# Patient Record
Sex: Male | Born: 1980 | Race: White | Hispanic: No | Marital: Single | Smoking: Never smoker
Health system: Southern US, Community
[De-identification: ages and names within clinical notes are randomized; demographics above are authoritative.]

## PROBLEM LIST (undated history)

## (undated) DIAGNOSIS — K219 Gastro-esophageal reflux disease without esophagitis: Secondary | ICD-10-CM

## (undated) DIAGNOSIS — D696 Thrombocytopenia, unspecified: Secondary | ICD-10-CM

## (undated) DIAGNOSIS — M8700 Idiopathic aseptic necrosis of unspecified bone: Secondary | ICD-10-CM

## (undated) DIAGNOSIS — R161 Splenomegaly, not elsewhere classified: Secondary | ICD-10-CM

## (undated) DIAGNOSIS — R188 Other ascites: Secondary | ICD-10-CM

## (undated) DIAGNOSIS — A045 Campylobacter enteritis: Secondary | ICD-10-CM

## (undated) DIAGNOSIS — J189 Pneumonia, unspecified organism: Secondary | ICD-10-CM

## (undated) DIAGNOSIS — K7460 Unspecified cirrhosis of liver: Secondary | ICD-10-CM

## (undated) HISTORY — DX: Idiopathic aseptic necrosis of unspecified bone: M87.00

## (undated) HISTORY — DX: Splenomegaly, not elsewhere classified: R16.1

## (undated) HISTORY — DX: Campylobacter enteritis: A04.5

## (undated) HISTORY — DX: Thrombocytopenia, unspecified: D69.6

## (undated) HISTORY — DX: Other ascites: R18.8

---

## 2011-09-29 DIAGNOSIS — R188 Other ascites: Secondary | ICD-10-CM

## 2011-09-29 DIAGNOSIS — D696 Thrombocytopenia, unspecified: Secondary | ICD-10-CM | POA: Diagnosis present

## 2011-09-29 DIAGNOSIS — R161 Splenomegaly, not elsewhere classified: Secondary | ICD-10-CM

## 2011-09-29 DIAGNOSIS — K7460 Unspecified cirrhosis of liver: Secondary | ICD-10-CM

## 2011-09-29 HISTORY — DX: Unspecified cirrhosis of liver: K74.60

## 2011-09-29 HISTORY — PX: PARACENTESIS: SHX844

## 2011-09-29 HISTORY — DX: Thrombocytopenia, unspecified: D69.6

## 2011-09-29 HISTORY — DX: Splenomegaly, not elsewhere classified: R16.1

## 2011-09-29 HISTORY — DX: Other ascites: R18.8

## 2015-08-29 HISTORY — PX: ESOPHAGOGASTRODUODENOSCOPY: SHX1529

## 2015-09-03 ENCOUNTER — Encounter: Payer: Self-pay | Admitting: Gastroenterology

## 2016-04-28 DIAGNOSIS — A045 Campylobacter enteritis: Secondary | ICD-10-CM

## 2016-04-28 HISTORY — DX: Campylobacter enteritis: A04.5

## 2016-05-08 ENCOUNTER — Inpatient Hospital Stay (HOSPITAL_COMMUNITY)
Admission: AD | Admit: 2016-05-08 | Discharge: 2016-05-10 | DRG: 372 | Disposition: A | Discharge status: Home / Self Care | Payer: Self-pay | Source: Other Acute Inpatient Hospital | Attending: Internal Medicine | Admitting: Internal Medicine

## 2016-05-08 DIAGNOSIS — K219 Gastro-esophageal reflux disease without esophagitis: Secondary | ICD-10-CM

## 2016-05-08 DIAGNOSIS — K766 Portal hypertension: Secondary | ICD-10-CM | POA: Diagnosis present

## 2016-05-08 DIAGNOSIS — K921 Melena: Secondary | ICD-10-CM

## 2016-05-08 DIAGNOSIS — R197 Diarrhea, unspecified: Secondary | ICD-10-CM | POA: Diagnosis present

## 2016-05-08 DIAGNOSIS — F1010 Alcohol abuse, uncomplicated: Secondary | ICD-10-CM | POA: Diagnosis present

## 2016-05-08 DIAGNOSIS — Z6836 Body mass index (BMI) 36.0-36.9, adult: Secondary | ICD-10-CM

## 2016-05-08 DIAGNOSIS — K7011 Alcoholic hepatitis with ascites: Secondary | ICD-10-CM | POA: Diagnosis present

## 2016-05-08 DIAGNOSIS — E538 Deficiency of other specified B group vitamins: Secondary | ICD-10-CM | POA: Diagnosis present

## 2016-05-08 DIAGNOSIS — K7031 Alcoholic cirrhosis of liver with ascites: Secondary | ICD-10-CM | POA: Diagnosis present

## 2016-05-08 DIAGNOSIS — A045 Campylobacter enteritis: Principal | ICD-10-CM | POA: Diagnosis present

## 2016-05-08 DIAGNOSIS — I10 Essential (primary) hypertension: Secondary | ICD-10-CM | POA: Diagnosis present

## 2016-05-08 DIAGNOSIS — Z8249 Family history of ischemic heart disease and other diseases of the circulatory system: Secondary | ICD-10-CM

## 2016-05-08 DIAGNOSIS — D500 Iron deficiency anemia secondary to blood loss (chronic): Secondary | ICD-10-CM | POA: Diagnosis present

## 2016-05-08 DIAGNOSIS — D6959 Other secondary thrombocytopenia: Secondary | ICD-10-CM | POA: Diagnosis present

## 2016-05-08 DIAGNOSIS — F1020 Alcohol dependence, uncomplicated: Secondary | ICD-10-CM | POA: Diagnosis present

## 2016-05-08 DIAGNOSIS — Z833 Family history of diabetes mellitus: Secondary | ICD-10-CM

## 2016-05-08 DIAGNOSIS — E860 Dehydration: Secondary | ICD-10-CM | POA: Diagnosis present

## 2016-05-08 HISTORY — DX: Unspecified cirrhosis of liver: K74.60

## 2016-05-09 ENCOUNTER — Encounter (HOSPITAL_COMMUNITY): Payer: Self-pay | Admitting: Internal Medicine

## 2016-05-09 DIAGNOSIS — K7031 Alcoholic cirrhosis of liver with ascites: Secondary | ICD-10-CM | POA: Diagnosis present

## 2016-05-09 DIAGNOSIS — A045 Campylobacter enteritis: Principal | ICD-10-CM | POA: Diagnosis present

## 2016-05-09 DIAGNOSIS — E860 Dehydration: Secondary | ICD-10-CM

## 2016-05-09 DIAGNOSIS — A09 Infectious gastroenteritis and colitis, unspecified: Secondary | ICD-10-CM

## 2016-05-09 DIAGNOSIS — F1010 Alcohol abuse, uncomplicated: Secondary | ICD-10-CM | POA: Diagnosis present

## 2016-05-09 DIAGNOSIS — R197 Diarrhea, unspecified: Secondary | ICD-10-CM | POA: Diagnosis present

## 2016-05-09 DIAGNOSIS — K921 Melena: Secondary | ICD-10-CM

## 2016-05-09 LAB — COMPREHENSIVE METABOLIC PANEL
ALT: 32 U/L (ref 17–63)
AST: 76 U/L — AB (ref 15–41)
Albumin: 2.6 g/dL — ABNORMAL LOW (ref 3.5–5.0)
Alkaline Phosphatase: 148 U/L — ABNORMAL HIGH (ref 38–126)
Anion gap: 11 (ref 5–15)
BUN: 14 mg/dL (ref 6–20)
CHLORIDE: 103 mmol/L (ref 101–111)
CO2: 24 mmol/L (ref 22–32)
CREATININE: 0.7 mg/dL (ref 0.61–1.24)
Calcium: 8.4 mg/dL — ABNORMAL LOW (ref 8.9–10.3)
GFR calc non Af Amer: >60 mL/min (ref >60)
Glucose, Bld: 89 mg/dL (ref 65–99)
POTASSIUM: 3.7 mmol/L (ref 3.5–5.1)
SODIUM: 138 mmol/L (ref 135–145)
Total Bilirubin: 2.4 mg/dL — ABNORMAL HIGH (ref 0.3–1.2)
Total Protein: 6.1 g/dL — ABNORMAL LOW (ref 6.5–8.1)

## 2016-05-09 LAB — TSH: TSH: 3.591 u[IU]/mL (ref 0.350–4.500)

## 2016-05-09 LAB — CBC
HEMATOCRIT: 31.5 % — AB (ref 39.0–52.0)
Hemoglobin: 10.2 g/dL — ABNORMAL LOW (ref 13.0–17.0)
MCH: 32.2 pg (ref 26.0–34.0)
MCHC: 32.4 g/dL (ref 30.0–36.0)
MCV: 99.4 fL (ref 78.0–100.0)
PLATELETS: 110 10*3/uL — AB (ref 150–400)
RBC: 3.17 MIL/uL — AB (ref 4.22–5.81)
RDW: 15.6 % — ABNORMAL HIGH (ref 11.5–15.5)
WBC: 9.8 10*3/uL (ref 4.0–10.5)

## 2016-05-09 LAB — GASTROINTESTINAL PANEL BY PCR, STOOL (REPLACES STOOL CULTURE)

## 2016-05-09 LAB — TYPE AND SCREEN
ABO/RH(D): A POS
Antibody Screen: NEGATIVE

## 2016-05-09 LAB — MAGNESIUM: Magnesium: 1.3 mg/dL — ABNORMAL LOW (ref 1.7–2.4)

## 2016-05-09 LAB — C DIFFICILE QUICK SCREEN W PCR REFLEX
C Diff antigen: NEGATIVE
C Diff interpretation: NOT DETECTED
C Diff toxin: NEGATIVE

## 2016-05-09 LAB — PROTIME-INR
INR: 1.39
Prothrombin Time: 17.2 seconds — ABNORMAL HIGH (ref 11.4–15.2)

## 2016-05-09 LAB — ABO/RH: ABO/RH(D): A POS

## 2016-05-09 LAB — PHOSPHORUS: Phosphorus: 3.9 mg/dL (ref 2.5–4.6)

## 2016-05-09 MED ORDER — LEVOFLOXACIN IN D5W 750 MG/150ML IV SOLN
750.0000 mg | INTRAVENOUS | Status: DC
Start: 2016-05-09 — End: 2016-05-10
  Administered 2016-05-09: 750 mg via INTRAVENOUS
  Filled 2016-05-09: qty 150

## 2016-05-09 MED ORDER — LORAZEPAM 2 MG/ML IJ SOLN: 0.0000 mg | Freq: Four times a day (QID) | INTRAMUSCULAR | Status: DC

## 2016-05-09 MED ORDER — PROPRANOLOL HCL 20 MG PO TABS
20.0000 mg | ORAL_TABLET | Freq: Two times a day (BID) | ORAL | Status: DC
  Administered 2016-05-09 – 2016-05-10 (×4): 20 mg via ORAL
  Filled 2016-05-09 (×5): qty 1

## 2016-05-09 MED ORDER — SODIUM CHLORIDE 0.9 % IV BOLUS (SEPSIS)
1000.0000 mL | Freq: Once | INTRAVENOUS | Status: AC
  Administered 2016-05-09: 1000 mL via INTRAVENOUS

## 2016-05-09 MED ORDER — LORAZEPAM 1 MG PO TABS: 1.0000 mg | ORAL_TABLET | Freq: Four times a day (QID) | ORAL | Status: DC | PRN

## 2016-05-09 MED ORDER — SODIUM CHLORIDE 0.9 % IV SOLN
INTRAVENOUS | Status: DC
  Administered 2016-05-09 – 2016-05-10 (×3): via INTRAVENOUS

## 2016-05-09 MED ORDER — ONDANSETRON HCL 4 MG/2ML IJ SOLN: 4.0000 mg | Freq: Four times a day (QID) | INTRAMUSCULAR | Status: DC | PRN

## 2016-05-09 MED ORDER — SODIUM CHLORIDE 0.9 % IV SOLN
INTRAVENOUS | Status: AC
Start: 2016-05-09 — End: 2016-05-09
  Administered 2016-05-09: 05:00:00 via INTRAVENOUS

## 2016-05-09 MED ORDER — SODIUM CHLORIDE 0.9% FLUSH
3.0000 mL | Freq: Two times a day (BID) | INTRAVENOUS | Status: DC
  Administered 2016-05-09: 3 mL via INTRAVENOUS

## 2016-05-09 MED ORDER — VITAMIN B-1 100 MG PO TABS
100.0000 mg | ORAL_TABLET | Freq: Every day | ORAL | Status: DC
  Administered 2016-05-09 – 2016-05-10 (×2): 100 mg via ORAL
  Filled 2016-05-09 (×2): qty 1

## 2016-05-09 MED ORDER — LORAZEPAM 2 MG/ML IJ SOLN: 1.0000 mg | Freq: Four times a day (QID) | INTRAMUSCULAR | Status: DC | PRN

## 2016-05-09 MED ORDER — FOLIC ACID 1 MG PO TABS
1.0000 mg | ORAL_TABLET | Freq: Every day | ORAL | Status: DC
  Administered 2016-05-09 – 2016-05-10 (×2): 1 mg via ORAL
  Filled 2016-05-09 (×2): qty 1

## 2016-05-09 MED ORDER — ADULT MULTIVITAMIN W/MINERALS CH
1.0000 | ORAL_TABLET | Freq: Every day | ORAL | Status: DC
  Administered 2016-05-09 – 2016-05-10 (×2): 1 via ORAL
  Filled 2016-05-09 (×2): qty 1

## 2016-05-09 MED ORDER — MAGNESIUM SULFATE 2 GM/50ML IV SOLN
2.0000 g | Freq: Once | INTRAVENOUS | Status: AC
  Administered 2016-05-09: 2 g via INTRAVENOUS
  Filled 2016-05-09: qty 50

## 2016-05-09 MED ORDER — PANTOPRAZOLE SODIUM 40 MG PO TBEC
40.0000 mg | DELAYED_RELEASE_TABLET | Freq: Every day | ORAL | Status: DC
  Administered 2016-05-09 – 2016-05-10 (×2): 40 mg via ORAL
  Filled 2016-05-09 (×2): qty 1

## 2016-05-09 MED ORDER — LORAZEPAM 2 MG/ML IJ SOLN: 0.0000 mg | Freq: Two times a day (BID) | INTRAMUSCULAR | Status: DC

## 2016-05-09 MED ORDER — ONDANSETRON HCL 4 MG PO TABS: 4.0000 mg | ORAL_TABLET | Freq: Four times a day (QID) | ORAL | Status: DC | PRN

## 2016-05-09 MED ORDER — THIAMINE HCL 100 MG/ML IJ SOLN: 100.0000 mg | Freq: Every day | INTRAMUSCULAR | Status: DC

## 2016-05-09 NOTE — H&P (Signed)
Grant [name] WGN:[id number] DOB: [birth year] DOA: 05/08/2016     PCP: none Outpatient Specialists: none Patient coming from:   home Lives  With family    Chief Complaint: Bloody diarrhea  HPI: Grant [name] is a 35 y.o. male with medical history significant of alcoholic cirrhosis with ascites and portal hypertension    Presented with sudden onset of diarrhea associated dark red stools and one episode of nausea and vomiting with minimal abdominal tenderness currently resolved. He reports eating pork ribs that he prepared himself his father also ate the same meal and have not developed any symptoms. His sister-in-law has recently had gastroenteritis from a trip to the beach but she has not been eating next to him or touched his meal. Has has history of GI bleed in the past state secondary to esophageal varices. He continues to drink about 6 packs every other day. Currently does not feel tremulous. Denies she will lightheadedness or history on a standup. Initially he have had up to 25 bloody bowel movements but now the number has decreased and he's been feeling more comfortable. Has not had any fevers or chills he has distended abdomen but no abdominal pain no chest pain no shortness of breath patient presented to [facility] for evaluation of bloody diarrhea.   Patient has known history of liver cirrhosis and have required thoracentesis in the past had been admitted in the past for GI bleed   IN ER: Afebrile heart rate up to 120 initially blood pressure 156/95 now down to 124/80 98% on RA  Sodium 137 potassium 4.6 anion gap 20 creatinine 0.4 glucose 117 total bilirubin 2.2 AST 109 ALT 43 alkaline phosphatase 233 albumin 3.6 and myoglobin 12.6 repeated 7 PM 0.0 UA no evidence of any UTI Stool was positive for Campylobacter Patient was given a dose of Levaquin 80 mg IV over Protonix and 1 L of normal saline INR 1.2 In emergency department patient continued to have bloody frequent stools  and one episode of vomiting bright red emesis Case has been discussed with Dr. Leone Payor at [facility] will consult on a patient  CT of abdomen and pelvis showed hepatomegaly with cirrhosis import hypertension splenomegaly perihepatic ascites. Umbilical fluid density structure Hospitalist was called for admission for bloody diarrhea secondary to Campylobacter  Review of Systems:    Pertinent positives include:  indigestion, abdominal pain, nausea, vomiting, diarrhea, blood in stool, hematemesis  Constitutional:  No weight loss, night sweats, Fevers, chills, fatigue, weight loss  HEENT:  No headaches, Difficulty swallowing,Tooth/dental problems,Sore throat,  No sneezing, itching, ear ache, nasal congestion, post nasal drip,  Cardio-vascular:  No chest pain, Orthopnea, PND, anasarca, dizziness, palpitations.no Bilateral lower extremity swelling  GI:  No heartburn,  change in bowel habits, loss of appetite, melena, Resp:  no shortness of breath at rest. No dyspnea on exertion, No excess mucus, no productive cough, No non-productive cough, No coughing up of blood.No change in color of mucus.No wheezing. Skin:  no rash or lesions. No jaundice GU:  no dysuria, change in color of urine, no urgency or frequency. No straining to urinate.  No flank pain.  Musculoskeletal:  No joint pain or no joint swelling. No decreased range of motion. No back pain.  Psych:  No change in mood or affect. No depression or anxiety. No memory loss.  Neuro: no localizing neurological complaints, no tingling, no weakness, no double vision, no gait abnormality, no slurred speech, no confusion  As per HPI otherwise 10 point review  of systems negative.   Past Medical History: Past Medical History:  Diagnosis Date  . Cirrhosis (HCC)    History reviewed. No pertinent surgical history.   Social History:  Ambulatory  independently      reports that he has never smoked. He has never used smokeless tobacco. He  reports that he drinks about 12.0 oz of alcohol per week . He reports that he does not use drugs.  Allergies:  No Known Allergies     Family History:  Family History  Problem Relation Age of Onset  . Diabetes Father   . Hypertension Father   . Diabetes Other   . Cancer Neg Hx     Current Meds  Medication Sig  . benazepril (LOTENSIN) 20 MG tablet Take 20 mg by mouth daily.  Marland Kitchen omeprazole (PRILOSEC) 20 MG capsule Take 20 mg by mouth 2 (two) times daily before a meal.  . propranolol (INDERAL) 40 MG tablet Take 40 mg by mouth 2 (two) times daily.     Physical Exam: Patient Vitals for the past 24 hrs:  BP Temp Temp src Pulse Resp SpO2 Height Weight  05/08/16 2326 (!) 144/73 98.3 F (36.8 C) Oral (!) 120 18 97 % 5\' 10"  (1.778 m) 116.4 kg (256 lb 9.9 oz)    1. General:  in No Acute distress 2. Psychological: Alert and   Oriented 3. Head/ENT:    Dry Mucous Membranes                          Head Non traumatic, neck supple                          Normal   Dentition           4. SKIN:  decreased Skin turgor,  Skin clean Dry and intact no rash icteric 5. Heart: Regular rate and rhythm no  Murmur, Rub or gallop 6. Lungs:  Clear to auscultation bilaterally, no wheezes or crackles   7. Abdomen: Soft, non-tender,  distended 8. Lower extremities: no clubbing, cyanosis, or edema 9. Neurologically Grossly intact, moving all 4 extremities equally 10. MSK: Normal range of motion   body mass index is 36.82 kg/m.  Labs on Admission:   Labs on Admission: I have personally reviewed following labs and imaging studies  CBC: No results for input(s): WBC, NEUTROABS, HGB, HCT, MCV, PLT in the last 168 hours. Basic Metabolic Panel: No results for input(s): NA, K, CL, CO2, GLUCOSE, BUN, CREATININE, CALCIUM, MG, PHOS in the last 168 hours. GFR: CrCl cannot be calculated (No order found.). Liver Function Tests: No results for input(s): AST, ALT, ALKPHOS, BILITOT, PROT, ALBUMIN in the last  168 hours. No results for input(s): LIPASE, AMYLASE in the last 168 hours. No results for input(s): AMMONIA in the last 168 hours. Coagulation Profile: No results for input(s): INR, PROTIME in the last 168 hours. Cardiac Enzymes: No results for input(s): CKTOTAL, CKMB, CKMBINDEX, TROPONINI in the last 168 hours. BNP (last 3 results) No results for input(s): PROBNP in the last 8760 hours. HbA1C: No results for input(s): HGBA1C in the last 72 hours. CBG: No results for input(s): GLUCAP in the last 168 hours. Lipid Profile: No results for input(s): CHOL, HDL, LDLCALC, TRIG, CHOLHDL, LDLDIRECT in the last 72 hours. Thyroid Function Tests: No results for input(s): TSH, T4TOTAL, FREET4, T3FREE, THYROIDAB in the last 72 hours. Anemia Panel: No results for input(s):  VITAMINB12, FOLATE, FERRITIN, TIBC, IRON, RETICCTPCT in the last 72 hours. Urine analysis: No results found for: COLORURINE, APPEARANCEUR, LABSPEC, PHURINE, GLUCOSEU, HGBUR, BILIRUBINUR, KETONESUR, PROTEINUR, UROBILINOGEN, NITRITE, LEUKOCYTESUR Sepsis Labs: @LABRCNTIP (procalcitonin:4,lacticidven:4) )No results found for this or any previous visit (from the past 240 hour(s)).     UA   no evidence of UTI  No results found for: HGBA1C  CrCl cannot be calculated (No order found.).  BNP (last 3 results) No results for input(s): PROBNP in the last 8760 hours.   ECG REPORT  Independently reviewed Rate: 123  Rhythm: Sinus tachycardia ST&T Change: No acute ischemic changes  QTC 480  Filed Weights   05/08/16 2326  Weight: 116.4 kg (256 lb 9.9 oz)     Cultures: No results found for: SDES, SPECREQUEST, CULT, REPTSTATUS   Radiological Exams on Admission: No results found.  Chart has been reviewed    Assessment/Plan   35 y.o. male with medical history significant of alcoholic cirrhosis with ascites and portal hypertensionPresents with Campylobacter-induced hemorrhagic  enteritis  with evidence of  dehydration  Present on Admission: . Dehydration - IV fluids check orthostatics tomorrow . Alcoholic cirrhosis (HCC) - chronic strongly recommended discontinuation of alcohol abuse we will need to have GI follow-up as an outpatient, follow INR currently no evidence of coagulopathy, patient does has history of portal hypertension . Diarrhea secondary to infectious etiology treat appropriately . Enteritis due to Campylobacter species - as per review of up-to-date Will initiate Levaquin per pharmacy, apreciate GI consult in AM, follow CBC . Alcohol abuse - CIWA protocol   Other plan as per orders.  DVT prophylaxis:  SCD      Code Status:  FULL CODE as per patient    Family Communication:   Family not at  Bedside   Disposition Plan:   To home once workup is complete and patient is stable   Consults called: Leone PayorGessner aware   Admission status:    inpatient        Level of care     tele           I have spent a total of  56 min on this admission    Tresea Heine 05/09/2016, 2:20 AM    [facility]  Pager [phone]   after 2 AM please page floor coverage PA If 7AM-7PM, please contact the day team taking care of the patient  [email or web address]  Password [email or web address]

## 2016-05-09 NOTE — Progress Notes (Signed)
Patient seen and examined. Admitted after midnight secondary to bloody diarrhea and vomiting. Patient had a history of alcohol hepatitis, cirrhosis and ongoing alcoholism. Patient was transferred [facility] secondary to lack of GI coverage over the weekend. Patient is currently hemodynamically stable, no complaining of any chest pain and reports no further episodes of hematemesis. Stools [facility] demonstrated positive Campylobacter jejuni infection. cultures at more her demonstrated positive He also endorses improvement/decrease in the amount of loose bloody stools. Please refer to H&P written by Dr. Adela Glimpseoutova for further info/details on admission.  Plan: -Will continue supportive care, IV PPI, IV Levaquin and follow GI recommendations -Diet to be advanced as per GI recommendations -continue CIWA protocol -continue thiamine and folic acid -cessation counseling for alcohol provided.   Grant LollMadera, Grant [name] [phone]

## 2016-05-09 NOTE — Progress Notes (Signed)
Pharmacy Antibiotic Note  Grant BosBryan [name] is a 35 y.o. male admitted [facility]2016 with bloody diarrhea - found to have camphylobacter enteritis and [facility].  Pharmacy has been consulted for Levaquin dosing.  Levaquin 750mg  IV given at [facility] ~1745  SCr (at [facility]) 0.4, est Crcl > 100 ml/min  Plan: Levaquin 750mg  IV q24h. Next dose 8/12 1800. Will f/u renal fxn, micro data, and pt's clinical condition  Height: 5\' 10"  (177.8 cm) Weight: 256 lb 9.9 oz (116.4 kg) IBW/kg (Calculated) : 73  Temp (24hrs), Avg:98.3 F (36.8 C), Min:98.3 F (36.8 C), Max:98.3 F (36.8 C)  No results for input(s): WBC, CREATININE, LATICACIDVEN, VANCOTROUGH, VANCOPEAK, VANCORANDOM, GENTTROUGH, GENTPEAK, GENTRANDOM, TOBRATROUGH, TOBRAPEAK, TOBRARND, AMIKACINPEAK, AMIKACINTROU, AMIKACIN in the last 168 hours.  CrCl cannot be calculated (No order found.).    No Known Allergies  Antimicrobials this admission: 8/11 Levaquin >>   Microbiology results: 8/11 stool ([facility]): camphylobacter jejuni 8/12 Cdiff: 8/12 GI PCR:  Thank you for allowing pharmacy to be a part of this patient's care.  Christoper Fabianaron Caitlin Ainley, PharmD, BCPS Clinical pharmacist, pager [phone] 05/09/2016 2:23 AM

## 2016-05-09 NOTE — Progress Notes (Signed)
[name]: 9:20 AM 05/09/2016  LOS: 0 days    Referring Provider: Dr Gwenlyn PerkingMadera  Primary Care Physician:  No PCP Per Patient previously followed [facility] [place].  Primary Gastroenterologist[place]  [place] [facility]     Reason for Consultation:  Bloody diarrhea and vomiting.    HPI: Grant [name] is a 35 y.o. male.  Hx ETOH hepatitis.  ETOH (also ? Of obesity related fatty liver based on his exam) cirrhosis, diagnosis dates to at least 2013.  Ongoing alcoholism.  08/2015 GIB (limited hematemesis) due to presumed esophageal varices, underwent EGD and "cauterized a vein or 2".  Taking Propanolol since but no GI follow up and no regular primary care follow up.  Ascites (dating to at least 2013) and prior paracentesis 2013.  Htn.  Thrombocytopenia.  Splenomegaly.  Folate deficiency.  Has only had the one EGD 08/2015 in his lifetime.   Admits to drinking 6 [employer] ales per day  3AM on 8/11 developed watery, frequent diarrhea and low abd pain.  Vomited small amount of blood at first, then no vomiting until once again, some blood as well, in ED that afternoon.  At noon diarrhea turned bloody.  At peak having BMs every 20 to 30 minutes, though pain resolved once bleeding started.  No syncope.    [facility]: hepatomegaly, cirrhosis, splenomegaly, perihepatic ascites, portal htn, umbilical fluid density, conglomeration of vessels and edema in right lower mesentery, bil femoral head AVN and ? Right subchondral fracture, right mid-lobe lung nodule: non-specific.   Campylobacter + on stool study, started on Levaquin and stools quickly improved with lower volume and frequency, also now darker/less bright red.  No recurrent emesis.   Hgb in [place] at  1900 was 12.0.  It is 10.2 at 0630 today.    No regular job.  No health care insurance.  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   A sister in law has recently had Salmonella food poisoning, he has seen her since her sxs resolved.  Otherwise no sick contacts.  Wt up 30 to 40# in last few years.  For several weeks, some increase in abd girth.   Eats well.  Pt admits to having a drinking problem but does not consider himself and alcoholic.  Had some sobriety but relapsed several months ago.  Has never been to ETOH rehab or to an AA mtg.       Past Medical History:  Diagnosis Date  . Cirrhosis (HCC)     History reviewed. No pertinent surgical history.  Prior to Admission medications   Medication Sig Start Date End Date Taking? Authorizing Provider  benazepril (LOTENSIN) 20 MG tablet Take 20 mg by mouth daily.   Yes Historical Provider, MD  omeprazole (PRILOSEC) 20 MG capsule Take 20 mg by mouth 2 (two) times daily before a meal.   Yes Historical Provider, MD  propranolol (INDERAL) 40 MG tablet Take 40 mg by mouth 2 (two) times  daily.   Yes Historical Provider, MD    Scheduled Meds: . folic acid  1 mg Oral Daily  . levofloxacin (LEVAQUIN) IV  750 mg Intravenous Q24H  . LORazepam  0-4 mg Intravenous Q6H   Followed by  . [START ON 05/11/2016] LORazepam  0-4 mg Intravenous Q12H  . multivitamin with minerals  1 tablet Oral Daily  . pantoprazole  40 mg Oral Daily  . propranolol  20 mg Oral BID  . sodium chloride flush  3 mL Intravenous Q12H  . thiamine  100 mg Oral Daily   Or  . thiamine  100 mg Intravenous Daily   Infusions: . sodium chloride 125 mL/hr at 05/09/16 0441   PRN Meds: LORazepam **OR** LORazepam, ondansetron **OR** ondansetron (ZOFRAN) IV   Allergies as of 05/08/2016  . (Not on File)    Family History  Problem Relation Age of Onset  . Diabetes Father   . Hypertension Father   . Diabetes Other   . Cancer Neg Hx      Social History   Social History  . Marital status: Single    Spouse name: N/A  . Number of children: N/A  . Years of education: 2 to 3 yrs of colleg   Occupational History  . 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 Smoking status: Never Smoker  . Smokeless tobacco: Never Used  . Alcohol use 12.0 oz/week    20 Cans of beer per week     Comment: in 04/2016 admits to 6, 12 oz apple ales per day.   . Drug use: No  . Sexual activity: Not on file   Other Topics Concern  . Not on file   Social History Narrative   Had nearly 1 year of college credit gained in high school AP classes.  Stayed in college for about 2 years, has about 3 yrs of college credits.     REVIEW OF SYSTEMS: Constitutional:  Generally no issues with weakness or fatigue ENT:  No nose bleeds Pulm:  No SOB or cough CV:  No palpitations, no LE edema.  GU:  No hematuria, no frequency GI:  Per HPI.  No dysphagia.  No constipation or previous BPR. Heme:  No unusual bleeding or bruuising   Transfusions:  none Neuro:  No headaches, no peripheral tingling or numbness Derm:  No itching, no rash or sores.  Endocrine:  No sweats or chills.  No polyuria or dysuria Immunization:  Not queried.  Travel:  None beyond local counties in last few months.    PHYSICAL EXAM: Vital signs in last 24 hours: Vitals:   05/09/16 0458 05/09/16 0527  BP:  125/65  Pulse: (!) 107 92  Resp:  20  Temp:  98.2 F (36.8 C)   Wt Readings from Last 3 Encounters:  05/08/16 116.4 kg (256 lb 9.9 oz)    General: obese, non-ill looking though pale, WM.  comfortable Head:  No asymmetry or swelling  Eyes:  No icterus or pallor Ears:  Not HOH  Nose:  No congestion or discharge Mouth:  Clear and moist Neck:  No JVD or masses Lungs:  Clear bil.   No cough. Heart: RRR.  No mrg Abdomen:  Obese with large striae.  No obvious hernias.  + hepatomegaly, but no tenderness.   Unable to locate/palpate  spleen.   Rectal: deferred   Musc/Skeltl: no joint swelling, redness or deformity Extremities:  No  CCE  Neurologic:  Oriented x 3.  Calm, engaged.  No tremor.  No asterixis.   Skin:  No telangectasia or rash   Psych:  Cooperative and pleasant.   Intake/Output from previous day: 08/11 0701 - 08/12 0700 In: 1164.6 [I.V.:164.6; IV Piggyback:1000] Out: 225 [Urine:225]  LAB RESULTS:  Recent Labs  05/09/16 0624  WBC 9.8  HGB 10.2*  HCT 31.5*  PLT 110*   BMET Lab Results  Component Value Date   NA 138 05/09/2016   K 3.7 05/09/2016   CL 103 05/09/2016   CO2 24 05/09/2016   GLUCOSE 89 05/09/2016   BUN 14 05/09/2016   CREATININE 0.70 05/09/2016   CALCIUM 8.4 (L) 05/09/2016   LFT  Recent Labs  05/09/16 0624  PROT 6.1*  ALBUMIN 2.6*  AST 76*  ALT 32  ALKPHOS 148*  BILITOT 2.4*   PT/INR Lab Results  Component Value Date   INR 1.39 05/09/2016     ENDOSCOPIC STUDIES: Per HPI  IMPRESSION:   *  Bloody diarrhea.  + campylobacter Ag.  sxs improving after starting Levaquin on 8/11 (1 dose thus far).  Wonder if cattle are source of the infection.   *  Cirrhosis due to ETOH and possibly obesity related fatty liver.  Hepatitis serologies not in Care Everywhere.  Suspect superimposed mild ETOH hepatitis (disc fx score of ~12).    *  Limited bloody emesis.  EGD with therapeutic intervention to what sounds like esophageal varices in 08/2016.  On Propanolol and Omeprazole at home  *  Normocytic anemia due to blood loss and dilutional effect of IVF.    *  Thrombocytopenia.  Non-critical.  Due to cirrhosis.   *  Elevated PT, INR wnl.    *  Alcoholism, ongoing abuse.  On CIWA protocol.     PLAN:     *  Follow CBC in AM.  Get Hep C and B serologies, as I do not see these in Care Everywhere.  *  Continue daily po Protonix.   *  EGD?, not urgent.   *  Emphasized need to stop all ETOH and suggested attendance at [facility] *  Needs to establish with a PMD and GI MD.      Jennye Moccasin  05/09/2016, 9:20 AM Pager: [phone]     [facility] Attending   I have taken an interval history, reviewed the chart and examined the patient. I agree with the Advanced Practitioner's note, impression and recommendations.   I do not think he is having bleeding from portal htn/varices - not sure he had any sig hematemesis Overall picture is of campylobacter diarrhea with bleeding from intestine in setting of alcoholic liver disease and cirrhosis Will feed If as improved as he says he is home tomorrow/Mon If other problems seen will determine if endoscopy needed Needs reg care as outpt from a PCP and GI No ETOH  Iva Boop, MD, [facility] [phone] (pager) [phone] after 5 PM, weekends and holidays  05/09/2016 1:22 PM

## 2016-05-10 DIAGNOSIS — K219 Gastro-esophageal reflux disease without esophagitis: Secondary | ICD-10-CM

## 2016-05-10 LAB — CBC
HCT: 30.5 % — ABNORMAL LOW (ref 39.0–52.0)
Hemoglobin: 9.6 g/dL — ABNORMAL LOW (ref 13.0–17.0)
MCH: 32 pg (ref 26.0–34.0)
MCHC: 31.5 g/dL (ref 30.0–36.0)
MCV: 101.7 fL — AB (ref 78.0–100.0)
PLATELETS: 106 10*3/uL — AB (ref 150–400)
RBC: 3 MIL/uL — ABNORMAL LOW (ref 4.22–5.81)
RDW: 16.1 % — AB (ref 11.5–15.5)
WBC: 8 10*3/uL (ref 4.0–10.5)

## 2016-05-10 MED ORDER — FOLIC ACID 1 MG PO TABS: 1.0000 mg | ORAL_TABLET | Freq: Every day | ORAL | 1 refills | Status: AC

## 2016-05-10 MED ORDER — PROPRANOLOL HCL 20 MG PO TABS: 20.0000 mg | ORAL_TABLET | Freq: Two times a day (BID) | ORAL | 1 refills | Status: DC

## 2016-05-10 MED ORDER — THIAMINE HCL 100 MG PO TABS: 100.0000 mg | ORAL_TABLET | Freq: Every day | ORAL | 1 refills | Status: AC

## 2016-05-10 MED ORDER — LEVOFLOXACIN 750 MG PO TABS: 750.0000 mg | ORAL_TABLET | Freq: Every day | ORAL | 0 refills | Status: DC

## 2016-05-10 NOTE — Progress Notes (Signed)
Reviewed discharge instructions and medications with patient. Answered all questions.

## 2016-05-10 NOTE — Progress Notes (Signed)
          Daily Rounding Note  05/10/2016, 11:46 AM  LOS: 1 day   SUBJECTIVE:   Chief complaint:  Bloody diarrhea   Stools no longer bloody, now just loose, watery and less frequent.  Tolerating full liquids.  Fells well.  No further n/v.    OBJECTIVE:         Vital signs in last 24 hours:    Temp:  [97.8 F (36.6 C)-98.6 F (37 C)] 97.8 F (36.6 C) (08/13 0904) Pulse Rate:  [86-95] 95 (08/13 0904) Resp:  [16-18] 16 (08/13 0904) BP: (109-125)/(62-84) 125/74 (08/13 0904) SpO2:  [96 %-100 %] 97 % (08/13 0904) Last BM Date: 05/09/16 Filed Weights   05/08/16 2326  Weight: 116.4 kg (256 lb 9.9 oz)   General: pleasant, somewhat pale but looks well   Heart: RRR.  No mrg Chest: clear, no cough or dyspnea Abdomen: obese, NT, ND.  Active BS  Extremities: no CCE Neuro/Psych:  Alert, fully oriented x 3.  No tremor. No gross weakness.   Intake/Output from previous day: 08/12 0701 - 08/13 0700 In: 4690 [P.O.:2280; I.V.:2410] Out: 850 [Urine:850]   Lab Results:  Recent Labs  05/09/16 0624 05/10/16 0449  WBC 9.8 8.0  HGB 10.2* 9.6*  HCT 31.5* 30.5*  PLT 110* 106*     ASSESMENT:   *  Bloody diarrhea.  + campylobacter Ag.  sxs improving after starting Levaquin on 8/11.  Bleeding has resolved.  Wonder if cattle are source of the infection.   *  Cirrhosis due to ETOH and possibly obesity related fatty liver. Suspect superimposed mild ETOH hepatitis (disc fx score of ~12).  Hepatitis serologies not in Care Everywhere and currently in process.     *  Limited bloody emesis, no recurrence. Marland Kitchen.  EGD with therapeutic intervention to what sounds like esophageal varices in 08/2016.  On Propanolol and Omeprazole at home  *  Normocytic anemia due to blood loss and dilutional effect of IVF.    *  Thrombocytopenia.  Non-critical.  Due to cirrhosis.   *  Elevated PT, INR wnl.    *  Alcoholism, ongoing abuse.  On CIWA protocol.  No  active withdrawal at present.      PLAN   *  OK to discharge today.  [facility].  Needs to stay sober, attend AA meetings.  Needs to establish or restablish care with a PMD.  Has seen Dr Teena DunkBenson, [place], in [place] who performed pt's EGD last year.  Should restablish care with him. Hand hygeine to avoid infections.  Stay on daily PPI.  Continue Propanolol.     Jennye MoccasinSarah Gribbin  05/10/2016, 11:46 AM Pager: [phone]    [facility] Attending   I have taken an interval history, reviewed the chart and examined the patient. I agree with the Advanced Practitioner's note, impression and recommendations.    He is improved and can go home as above.  Iva Booparl E. Shuree Brossart, MD, [facility] [phone] (pager) [phone] after 5 PM, weekends and holidays  05/10/2016 12:12 PM

## 2016-05-10 NOTE — Discharge Summary (Signed)
Physician Discharge Summary  Darnelle BosBryan Veach ZOX:[id number] DOB: 08/17/1981 DOA: 05/08/2016  PCP: No PCP Per Patient  Admit date: 05/08/2016 Discharge date: 05/10/2016  Time spent: 35 minutes  Recommendations for Outpatient Follow-up:  1. Repeat BMET to follow electrolytes and renal function  2. Repeat magnesium level to follow up trend 3. Repeat CBC to follow Hgb and platelets   Discharge Diagnoses:  Active Problems:   Dehydration   Alcoholic cirrhosis of liver with ascites (HCC)   Diarrhea   Campylobacter diarrhea   Alcohol abuse   Hematochezia   Esophageal reflux   Morbid obesity due to excess calories [facility](HCC)   Discharge Condition: stable and improved. No further hematochezia and/or hematemesis. Patient advise to establish care with PCP and GI service for follow up as an outpatient.  Diet recommendation: low sodium diet (giving cirrhosis hx)  Filed Weights   05/08/16 2326  Weight: 116.4 kg (256 lb 9.9 oz)    History of present illness:  Patient had a history of alcohol hepatitis, cirrhosis and ongoing alcoholism. Patient was transferred from [facility] secondary to lack of GI coverage over the weekend. Presented there with 2 days of bloody diarrhea and episode of hematemesis. No CP, no SOB.  Hospital Course:  1-bloody diarrhea: secondary to campylobacter infection  -improved and no longer with hematochezia -will discharge on levaquin to complete therapy (5 days of antibiotics total) -advise to keep himself well hydrated  -outpatient follow up with PCP and GI recommended   2-alcohol hepatitis/cirrhosis  -outpatient follow up with GI recommended -advise to quit alcohol intake -continue inderal and PPI  3-GERD: -continue PPI  4-alcohol abuse: -no withdrawal symptoms appreciated -patient was on CIWA protocol  -cessation counseling provided -discharge on folic acid and thiamine   5-thrombocytopenia : -secondary to alcohol consumption mos likely  -no bruises  appreciated on physical exam -no further bleeding once GIB stopped -will need CBC to follow up with platelets trend  Procedures:  See below for x-ray reports   Consultations: Gastroenterology (Dr. Leone PayorGessner)   Discharge Exam: Vitals:   05/10/16 0904 05/10/16 1214  BP: 125/74 138/70  Pulse: 95 90  Resp: 16   Temp: 97.8 F (36.6 C)     General: afebrile, no nausea, no vomiting/hematemesis and no further episodes of hematochezia. Patient denies abd pain and is tolerating diet. Cardiovascular: S1 and S2, no rubs, no gallops Respiratory: CTA bilaterally Abd: obese, soft, NT, ND, positive BS Extremities: no cyanosis and no edema   Discharge Instructions   Discharge Instructions    Discharge instructions    Complete by:  As directed   Take medications as prescribed Stop drinking alcohol Arrange follow up with PCP in 10 days Follow up in as needed basis with Gastroenterologist (but set up appointment for further follow up)     Current Discharge Medication List    START taking these medications   Details  folic acid (FOLVITE) 1 MG tablet Take 1 tablet (1 mg total) by mouth daily. Qty: 30 tablet, Refills: 1    levofloxacin (LEVAQUIN) 750 MG tablet Take 1 tablet (750 mg total) by mouth daily. Qty: 4 tablet, Refills: 0    thiamine 100 MG tablet Take 1 tablet (100 mg total) by mouth daily. Qty: 30 tablet, Refills: 1      CONTINUE these medications which have CHANGED   Details  propranolol (INDERAL) 20 MG tablet Take 1 tablet (20 mg total) by mouth 2 (two) times daily. Qty: 60 tablet, Refills: 1  CONTINUE these medications which have NOT CHANGED   Details  Multiple Vitamin (MULTIVITAMIN WITH MINERALS) TABS tablet Take 1 tablet by mouth daily.    omeprazole (PRILOSEC) 20 MG capsule Take 20 mg by mouth 2 (two) times daily before a meal.    OVER THE COUNTER MEDICATION Place 1 drop into both eyes daily as needed (dry eyes).      STOP taking these medications      benazepril (LOTENSIN) 20 MG tablet        No Known Allergies Follow-up Information    Stan Head, MD .   Specialty:  Gastroenterology Why:  contact office and set up appointment for visit on as needed basis if you will like to establish care with him Contact information: [address] [place] [address] [phone]        Eula Listen, MD .   Specialty:  Gastroenterology Why:  contact office for follow up visit if you will like to establish care with this gastroenterologist in [facility] information: [address] [place] [address] [phone]           The results of significant diagnostics from this hospitalization (including imaging, microbiology, ancillary and laboratory) are listed below for reference.    Significant Diagnostic Studies: No results found.  Microbiology: Recent Results (from the past 240 hour(s))  Gastrointestinal Panel by PCR , Stool     Status: None   Collection Time: 05/09/16  1:39 AM  Result Value Ref Range Status   Campylobacter species NOT DETECTED NOT DETECTED Final   Plesimonas shigelloides NOT DETECTED NOT DETECTED Final   Salmonella species NOT DETECTED NOT DETECTED Final   Yersinia enterocolitica NOT DETECTED NOT DETECTED Final   Vibrio species NOT DETECTED NOT DETECTED Final   Vibrio cholerae NOT DETECTED NOT DETECTED Final   Enteroaggregative E coli (EAEC) NOT DETECTED NOT DETECTED Final   Enteropathogenic E coli (EPEC) NOT DETECTED NOT DETECTED Final   Enterotoxigenic E coli (ETEC) NOT DETECTED NOT DETECTED Final   Shiga like toxin producing E coli (STEC) NOT DETECTED NOT DETECTED Final   E. coli O157 NOT DETECTED NOT DETECTED Final   Shigella/Enteroinvasive E coli (EIEC) NOT DETECTED NOT DETECTED Final   Cryptosporidium NOT DETECTED NOT DETECTED Final   Cyclospora cayetanensis NOT DETECTED NOT DETECTED Final   Entamoeba histolytica NOT DETECTED NOT DETECTED Final   Giardia lamblia NOT DETECTED NOT  DETECTED Final   Adenovirus F40/41 NOT DETECTED NOT DETECTED Final   Astrovirus NOT DETECTED NOT DETECTED Final   Norovirus GI/GII NOT DETECTED NOT DETECTED Final   Rotavirus A NOT DETECTED NOT DETECTED Final   Sapovirus (I, II, IV, and V) NOT DETECTED NOT DETECTED Final  C difficile quick scan w PCR reflex     Status: None   Collection Time: 05/09/16  1:39 AM  Result Value Ref Range Status   C Diff antigen NEGATIVE NEGATIVE Final   C Diff toxin NEGATIVE NEGATIVE Final   C Diff interpretation No C. difficile detected.  Final     Labs: Basic Metabolic Panel:  Recent Labs Lab 05/09/16 0624  NA 138  K 3.7  CL 103  CO2 24  GLUCOSE 89  BUN 14  CREATININE 0.70  CALCIUM 8.4*  MG 1.3*  PHOS 3.9   Liver Function Tests:  Recent Labs Lab 05/09/16 0624  AST 76*  ALT 32  ALKPHOS 148*  BILITOT 2.4*  PROT 6.1*  ALBUMIN 2.6*   CBC:  Recent Labs Lab 05/09/16  1610 05/10/16 0449  WBC 9.8 8.0  HGB 10.2* 9.6*  HCT 31.5* 30.5*  MCV 99.4 101.7*  PLT 110* 106*    Signed:  Vassie Loll MD.  [facility] 05/10/2016, 4:49 PM

## 2016-05-11 LAB — HEPATITIS B SURFACE ANTIBODY,QUALITATIVE: HEP B S AB: NONREACTIVE

## 2016-05-11 LAB — HEPATITIS C ANTIBODY

## 2016-05-11 LAB — HEPATITIS B CORE ANTIBODY, TOTAL: HEP B C TOTAL AB: NEGATIVE

## 2016-05-11 LAB — HEPATITIS B SURFACE ANTIGEN: Hepatitis B Surface Ag: NEGATIVE

## 2016-08-07 ENCOUNTER — Encounter: Payer: Self-pay | Admitting: Physician Assistant

## 2018-01-31 ENCOUNTER — Emergency Department (HOSPITAL_COMMUNITY): Payer: Self-pay

## 2018-01-31 ENCOUNTER — Encounter (HOSPITAL_COMMUNITY): Payer: Self-pay | Admitting: Emergency Medicine

## 2018-01-31 ENCOUNTER — Inpatient Hospital Stay (HOSPITAL_COMMUNITY)
Admission: EM | Admit: 2018-01-31 | Discharge: 2018-02-04 | DRG: 432 | Disposition: A | Discharge status: Home / Self Care | Payer: Self-pay | Attending: Family Medicine | Admitting: Family Medicine

## 2018-01-31 ENCOUNTER — Other Ambulatory Visit: Payer: Self-pay

## 2018-01-31 DIAGNOSIS — R600 Localized edema: Secondary | ICD-10-CM | POA: Diagnosis present

## 2018-01-31 DIAGNOSIS — R161 Splenomegaly, not elsewhere classified: Secondary | ICD-10-CM | POA: Diagnosis present

## 2018-01-31 DIAGNOSIS — Z6841 Body Mass Index (BMI) 40.0 and over, adult: Secondary | ICD-10-CM

## 2018-01-31 DIAGNOSIS — D649 Anemia, unspecified: Secondary | ICD-10-CM

## 2018-01-31 DIAGNOSIS — Z833 Family history of diabetes mellitus: Secondary | ICD-10-CM

## 2018-01-31 DIAGNOSIS — E8809 Other disorders of plasma-protein metabolism, not elsewhere classified: Secondary | ICD-10-CM | POA: Diagnosis present

## 2018-01-31 DIAGNOSIS — K429 Umbilical hernia without obstruction or gangrene: Secondary | ICD-10-CM | POA: Diagnosis present

## 2018-01-31 DIAGNOSIS — R140 Abdominal distension (gaseous): Secondary | ICD-10-CM

## 2018-01-31 DIAGNOSIS — F1020 Alcohol dependence, uncomplicated: Secondary | ICD-10-CM | POA: Diagnosis present

## 2018-01-31 DIAGNOSIS — D509 Iron deficiency anemia, unspecified: Secondary | ICD-10-CM | POA: Diagnosis present

## 2018-01-31 DIAGNOSIS — R188 Other ascites: Secondary | ICD-10-CM

## 2018-01-31 DIAGNOSIS — Z79899 Other long term (current) drug therapy: Secondary | ICD-10-CM

## 2018-01-31 DIAGNOSIS — D696 Thrombocytopenia, unspecified: Secondary | ICD-10-CM

## 2018-01-31 DIAGNOSIS — E876 Hypokalemia: Secondary | ICD-10-CM

## 2018-01-31 DIAGNOSIS — K7460 Unspecified cirrhosis of liver: Secondary | ICD-10-CM

## 2018-01-31 DIAGNOSIS — K7031 Alcoholic cirrhosis of liver with ascites: Principal | ICD-10-CM

## 2018-01-31 DIAGNOSIS — Z23 Encounter for immunization: Secondary | ICD-10-CM

## 2018-01-31 DIAGNOSIS — K760 Fatty (change of) liver, not elsewhere classified: Secondary | ICD-10-CM | POA: Diagnosis present

## 2018-01-31 DIAGNOSIS — E8770 Fluid overload, unspecified: Secondary | ICD-10-CM | POA: Diagnosis present

## 2018-01-31 DIAGNOSIS — Z8249 Family history of ischemic heart disease and other diseases of the circulatory system: Secondary | ICD-10-CM

## 2018-01-31 DIAGNOSIS — D6959 Other secondary thrombocytopenia: Secondary | ICD-10-CM | POA: Diagnosis not present

## 2018-01-31 DIAGNOSIS — K219 Gastro-esophageal reflux disease without esophagitis: Secondary | ICD-10-CM | POA: Diagnosis present

## 2018-01-31 DIAGNOSIS — J181 Lobar pneumonia, unspecified organism: Secondary | ICD-10-CM | POA: Diagnosis present

## 2018-01-31 DIAGNOSIS — M8788 Other osteonecrosis, other site: Secondary | ICD-10-CM | POA: Diagnosis present

## 2018-01-31 DIAGNOSIS — K766 Portal hypertension: Secondary | ICD-10-CM | POA: Diagnosis present

## 2018-01-31 DIAGNOSIS — J189 Pneumonia, unspecified organism: Secondary | ICD-10-CM

## 2018-01-31 DIAGNOSIS — Z8371 Family history of colonic polyps: Secondary | ICD-10-CM

## 2018-01-31 DIAGNOSIS — F1010 Alcohol abuse, uncomplicated: Secondary | ICD-10-CM

## 2018-01-31 HISTORY — DX: Hypomagnesemia: E83.42

## 2018-01-31 LAB — RAPID URINE DRUG SCREEN, HOSP PERFORMED
Amphetamines: NOT DETECTED
Barbiturates: NOT DETECTED
Benzodiazepines: NOT DETECTED
Cocaine: NOT DETECTED
Opiates: NOT DETECTED
Tetrahydrocannabinol: NOT DETECTED

## 2018-01-31 LAB — DIFFERENTIAL
BASOS ABS: 0.1 10*3/uL (ref 0.0–0.1)
BLASTS: 0 %
Band Neutrophils: 0 %
Basophils Relative: 2 %
EOS PCT: 7 %
Eosinophils Absolute: 0.2 10*3/uL (ref 0.0–0.7)
LYMPHS ABS: 0.8 10*3/uL (ref 0.7–4.0)
Lymphocytes Relative: 30 %
MONO ABS: 0.6 10*3/uL (ref 0.1–1.0)
Metamyelocytes Relative: 0 %
Monocytes Relative: 22 %
Myelocytes: 0 %
NEUTROS ABS: 1.1 10*3/uL — AB (ref 1.7–7.7)
Neutrophils Relative %: 39 %
Other: 0 %
Promyelocytes Relative: 0 %
nRBC: 0 /100 WBC

## 2018-01-31 LAB — URINALYSIS, ROUTINE W REFLEX MICROSCOPIC
BACTERIA UA: NONE SEEN
Glucose, UA: NEGATIVE mg/dL
Ketones, ur: NEGATIVE mg/dL
Leukocytes, UA: NEGATIVE
Nitrite: NEGATIVE
PROTEIN: NEGATIVE mg/dL
Specific Gravity, Urine: 1.019 (ref 1.005–1.030)
pH: 6 (ref 5.0–8.0)

## 2018-01-31 LAB — MAGNESIUM: MAGNESIUM: 1.3 mg/dL — AB (ref 1.7–2.4)

## 2018-01-31 LAB — CBC
HEMATOCRIT: 22.6 % — AB (ref 39.0–52.0)
Hemoglobin: 6.2 g/dL — CL (ref 13.0–17.0)
MCH: 20.2 pg — ABNORMAL LOW (ref 26.0–34.0)
MCHC: 27.4 g/dL — AB (ref 30.0–36.0)
MCV: 73.6 fL — ABNORMAL LOW (ref 78.0–100.0)
PLATELETS: 85 10*3/uL — AB (ref 150–400)
RBC: 3.07 MIL/uL — ABNORMAL LOW (ref 4.22–5.81)
RDW: 21.7 % — AB (ref 11.5–15.5)
WBC: 2.8 10*3/uL — ABNORMAL LOW (ref 4.0–10.5)

## 2018-01-31 LAB — COMPREHENSIVE METABOLIC PANEL
ALBUMIN: 2.7 g/dL — AB (ref 3.5–5.0)
ALT: 22 U/L (ref 17–63)
AST: 74 U/L — AB (ref 15–41)
Alkaline Phosphatase: 150 U/L — ABNORMAL HIGH (ref 38–126)
Anion gap: 8 (ref 5–15)
BILIRUBIN TOTAL: 3.3 mg/dL — AB (ref 0.3–1.2)
BUN: 5 mg/dL — AB (ref 6–20)
CO2: 26 mmol/L (ref 22–32)
CREATININE: 0.46 mg/dL — AB (ref 0.61–1.24)
Calcium: 8.2 mg/dL — ABNORMAL LOW (ref 8.9–10.3)
Chloride: 101 mmol/L (ref 101–111)
GFR calc Af Amer: >60 mL/min (ref >60)
GFR calc non Af Amer: >60 mL/min (ref >60)
GLUCOSE: 95 mg/dL (ref 65–99)
POTASSIUM: 3.3 mmol/L — AB (ref 3.5–5.1)
Sodium: 135 mmol/L (ref 135–145)
TOTAL PROTEIN: 7 g/dL (ref 6.5–8.1)

## 2018-01-31 LAB — ABO/RH: ABO/RH(D): A POS

## 2018-01-31 LAB — PROTIME-INR
INR: 1.48
PROTHROMBIN TIME: 17.8 s — AB (ref 11.4–15.2)

## 2018-01-31 LAB — STREP PNEUMONIAE URINARY ANTIGEN: STREP PNEUMO URINARY ANTIGEN: NEGATIVE

## 2018-01-31 LAB — LIPASE, BLOOD: Lipase: 38 U/L (ref 11–51)

## 2018-01-31 LAB — TSH: TSH: 2.121 u[IU]/mL (ref 0.350–4.500)

## 2018-01-31 LAB — PREPARE RBC (CROSSMATCH)

## 2018-01-31 LAB — DIRECT ANTIGLOBULIN TEST (NOT AT ARMC)
DAT, IGG: NEGATIVE
DAT, complement: NEGATIVE

## 2018-01-31 LAB — ETHANOL: Alcohol, Ethyl (B): <10 mg/dL (ref <10)

## 2018-01-31 MED ORDER — SODIUM CHLORIDE 0.9 % IV SOLN
500.0000 mg | INTRAVENOUS | Status: DC
  Administered 2018-01-31 – 2018-02-03 (×4): 500 mg via INTRAVENOUS
  Filled 2018-01-31 (×5): qty 500

## 2018-01-31 MED ORDER — SODIUM CHLORIDE 0.9 % IV SOLN: 250.0000 mL | INTRAVENOUS | Status: DC | PRN

## 2018-01-31 MED ORDER — FOLIC ACID 1 MG PO TABS
1.0000 mg | ORAL_TABLET | Freq: Every day | ORAL | Status: DC
  Administered 2018-01-31 – 2018-02-04 (×5): 1 mg via ORAL
  Filled 2018-01-31 (×5): qty 1

## 2018-01-31 MED ORDER — SODIUM CHLORIDE 0.9 % IV SOLN
10.0000 mL/h | Freq: Once | INTRAVENOUS | Status: AC
  Administered 2018-01-31: 10 mL/h via INTRAVENOUS

## 2018-01-31 MED ORDER — MAGNESIUM SULFATE 2 GM/50ML IV SOLN
2.0000 g | Freq: Once | INTRAVENOUS | Status: AC
  Administered 2018-01-31: 2 g via INTRAVENOUS
  Filled 2018-01-31: qty 50

## 2018-01-31 MED ORDER — FUROSEMIDE 10 MG/ML IJ SOLN
40.0000 mg | Freq: Two times a day (BID) | INTRAMUSCULAR | Status: DC
  Administered 2018-01-31 – 2018-02-01 (×2): 40 mg via INTRAVENOUS
  Filled 2018-01-31 (×2): qty 4

## 2018-01-31 MED ORDER — ADULT MULTIVITAMIN W/MINERALS CH
1.0000 | ORAL_TABLET | Freq: Every day | ORAL | Status: DC
  Administered 2018-01-31 – 2018-02-04 (×5): 1 via ORAL
  Filled 2018-01-31 (×5): qty 1

## 2018-01-31 MED ORDER — SODIUM CHLORIDE 0.9% FLUSH
3.0000 mL | Freq: Two times a day (BID) | INTRAVENOUS | Status: DC
  Administered 2018-02-01 – 2018-02-04 (×7): 3 mL via INTRAVENOUS

## 2018-01-31 MED ORDER — PNEUMOCOCCAL VAC POLYVALENT 25 MCG/0.5ML IJ INJ
0.5000 mL | INJECTION | INTRAMUSCULAR | Status: AC
  Administered 2018-02-01: 0.5 mL via INTRAMUSCULAR
  Filled 2018-01-31: qty 0.5

## 2018-01-31 MED ORDER — PANTOPRAZOLE SODIUM 40 MG PO TBEC
40.0000 mg | DELAYED_RELEASE_TABLET | Freq: Two times a day (BID) | ORAL | Status: DC
  Administered 2018-01-31 – 2018-02-01 (×2): 40 mg via ORAL
  Filled 2018-01-31 (×2): qty 1

## 2018-01-31 MED ORDER — PROPRANOLOL HCL 20 MG PO TABS
20.0000 mg | ORAL_TABLET | Freq: Two times a day (BID) | ORAL | Status: DC
  Administered 2018-01-31 – 2018-02-04 (×8): 20 mg via ORAL
  Filled 2018-01-31 (×8): qty 1

## 2018-01-31 MED ORDER — LORAZEPAM 1 MG PO TABS: 1.0000 mg | ORAL_TABLET | Freq: Four times a day (QID) | ORAL | Status: AC | PRN

## 2018-01-31 MED ORDER — VITAMIN B-1 100 MG PO TABS
100.0000 mg | ORAL_TABLET | Freq: Every day | ORAL | Status: DC
  Administered 2018-01-31 – 2018-02-04 (×5): 100 mg via ORAL
  Filled 2018-01-31 (×5): qty 1

## 2018-01-31 MED ORDER — POTASSIUM CHLORIDE CRYS ER 20 MEQ PO TBCR
40.0000 meq | EXTENDED_RELEASE_TABLET | Freq: Two times a day (BID) | ORAL | Status: DC
  Administered 2018-01-31: 40 meq via ORAL
  Filled 2018-01-31: qty 2

## 2018-01-31 MED ORDER — SODIUM CHLORIDE 0.9 % IV SOLN
1.0000 g | INTRAVENOUS | Status: DC
  Administered 2018-01-31 – 2018-02-03 (×4): 1 g via INTRAVENOUS
  Filled 2018-01-31 (×3): qty 10
  Filled 2018-01-31 (×2): qty 1
  Filled 2018-01-31 (×3): qty 10

## 2018-01-31 MED ORDER — SODIUM CHLORIDE 0.9% FLUSH: 3.0000 mL | INTRAVENOUS | Status: DC | PRN

## 2018-01-31 MED ORDER — LORAZEPAM 2 MG/ML IJ SOLN: 1.0000 mg | Freq: Four times a day (QID) | INTRAMUSCULAR | Status: AC | PRN

## 2018-01-31 MED ORDER — POTASSIUM CHLORIDE CRYS ER 20 MEQ PO TBCR
40.0000 meq | EXTENDED_RELEASE_TABLET | Freq: Once | ORAL | Status: AC
  Administered 2018-01-31: 40 meq via ORAL
  Filled 2018-01-31: qty 2

## 2018-01-31 NOTE — H&P (Addendum)
History and Physical  Howard Bunte ZOX:[id number] DOB: October 12, 1980 DOA: 01/31/2018  Referring physician: Clarene Duke  PCP: Patient, No Pcp Per   Chief Complaint: increasing abdominal distension  HPI: Grant [name] is a 37 y.o. male alcoholic with history of cirrhosis secondary to chronic alcoholism and thought to be related to hepatic steatosis reported that he has not had a paracentesis done since about 2013.  He had been doing fairly well and has been fairly stable.  He reports for the past several months he has noticed increasing abdominal distention, malaise and fatigue.  He reports that he has not followed up with GI since 2013.  He was admitted back in 2017 at [facility].  He denies having chest pain and shortness of breath.  He denies fever chills cough and rash.   The patient reports that he weaned himself off alcohol and reports that he has not had alcohol in the past month.  His serum alcohol level is negative today.  He denies any other recreational drug usage.  He has had alcohol withdrawal in the past but says that because he was able to wean off the alcohol slowly over a long period of time he has not been having withdrawals.     ED course: The patient was evaluated in the emergency department and found to have severe anemia with a hemoglobin of 6.6.  He also has severe thrombocytopenia with a platelet count of 85.  The patient has no signs or symptoms of acute bleeding from the GI tract at this time.  The patient had a chest x-ray that was positive for left lower lobe pneumonia.  The patient was started on treatment for low magnesium at 1.3.  He was also noted to have a low potassium and was given additional supplementation.  The patient will be admitted to the hospital for ongoing medical care.  Review of Systems: All systems reviewed and apart from history of presenting illness, are negative.  Past Medical History:  Diagnosis Date  . Ascites 2013  . Campylobacter diarrhea 04/2016   bloody diarrhea.   . 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 Hepatitis B and C serologies negative 06/2016.     Marland Kitchen Hypomagnesemia 01/31/2018  . Splenomegaly 2013  . Thrombocytopenia (HCC) 2013   Past Surgical History:  Procedure Laterality Date  . ESOPHAGOGASTRODUODENOSCOPY  08/2015   in [place] at [facility].  "Cauterized a vein or 2" (? esophageal varices?)  . PARACENTESIS  2013   In [place] at [facility].    Social History:  reports that he has never smoked. He has never used smokeless tobacco. He reports that he drinks about 12.0 oz of alcohol per week. He reports that he does not use drugs.  No Known Allergies  Family History  Problem Relation Age of Onset  . Diabetes Father   . Hypertension Father   . Diabetes Other   . Cancer Neg Hx     Prior to Admission medications   Medication Sig Start Date End Date Taking? Authorizing Provider  folic acid (FOLVITE) 1 MG tablet Take 1 tablet (1 mg total) by mouth daily. 05/10/16  Yes Vassie Loll, MD  Multiple Vitamin (MULTIVITAMIN WITH MINERALS) TABS tablet Take 1 tablet by mouth daily.   Yes [provider]  omeprazole (PRILOSEC) 20 MG capsule Take 20 mg by mouth 2 (two) times daily before a meal.   Yes [provider]  propranolol (  INDERAL) 20 MG tablet Take 1 tablet (20 mg total) by mouth 2 (two) times daily. 05/10/16  Yes Vassie Loll, MD  thiamine 100 MG tablet Take 1 tablet (100 mg total) by mouth daily. 05/10/16  Yes Vassie Loll, MD   Physical Exam: Vitals:   01/31/18 1050 01/31/18 1051 01/31/18 1411  BP: 133/73  128/69  Pulse: 91  73  Resp: 14  16  Temp: 98 F (36.7 C)    TempSrc: Oral    SpO2: 94%  100%  Weight:  130.3 kg (287 lb 5 oz)   Height:   (1.778 m)      General exam: Moderately built and nourished patient, lying comfortably supine on the gurney in no obvious distress.  Head, eyes and ENT: Nontraumatic  and normocephalic. Pupils equally reacting to light and accommodation. Oral mucosa moist. NO SCLERAL ICTERUS.   Neck: Supple. No JVD, carotid bruit or thyromegaly.  Lymphatics: No lymphadenopathy.  Respiratory system: some shallow breathing noted. Rales LLL. No increased work of breathing.  Cardiovascular system: S1 and S2 heard, RRR. No JVD, murmurs, gallops, clicks. 2+ pitting edema BLEs.   Gastrointestinal system: Abdomen is distended, soft and mild generalized nonspecific tenderness. Normal bowel sounds heard. No organomegaly or masses appreciated.  Central nervous system: Alert and oriented. No focal neurological deficits.  Extremities: 2+ pitting edema bilateral LEs.  Symmetric 5 x 5 power. Peripheral pulses symmetrically felt.   Skin: No rashes or acute findings.  Musculoskeletal system: Negative exam.  Psychiatry: Pleasant and cooperative.  Labs on Admission:  Basic Metabolic Panel: Recent Labs  Lab 01/31/18 1140 01/31/18 1235  NA 135  --   K 3.3*  --   CL 101  --   CO2 26  --   GLUCOSE 95  --   BUN 5*  --   CREATININE 0.46*  --   CALCIUM 8.2*  --   MG  --  1.3*   Liver Function Tests: Recent Labs  Lab 01/31/18 1140  AST 74*  ALT 22  ALKPHOS 150*  BILITOT 3.3*  PROT 7.0  ALBUMIN 2.7*   Recent Labs  Lab 01/31/18 1140  LIPASE 38   No results for input(s): AMMONIA in the last 168 hours. CBC: Recent Labs  Lab 01/31/18 1140 01/31/18 1235  WBC 2.8*  --   NEUTROABS  --  1.1*  HGB 6.2*  --   HCT 22.6*  --   MCV 73.6*  --   PLT 85*  --    Cardiac Enzymes: No results for input(s): CKTOTAL, CKMB, CKMBINDEX, TROPONINI in the last 168 hours.  BNP (last 3 results) No results for input(s): PROBNP in the last 8760 hours. CBG: No results for input(s): GLUCAP in the last 168 hours.  Radiological Exams on Admission: Dg Chest 2 View  Result Date: 01/31/2018 CLINICAL DATA:  Abdominal swelling EXAM: CHEST - 2 VIEW COMPARISON:  None. FINDINGS: Cardiac  shadow is within normal limits. The lungs are well aerated bilaterally. Left lower lobe infiltrate with associated effusion is seen. IMPRESSION: Left lower lobe pneumonia Electronically Signed   By: Alcide Clever M.D.   On: 01/31/2018 13:45   US Abdomen Complete  Result Date: 01/31/2018 CLINICAL DATA:  37 year old male with abdominal swelling, history of alcoholic cirrhosis and ascites EXAM: ABDOMEN ULTRASOUND COMPLETE COMPARISON:  None. FINDINGS: Gallbladder: No gallstones or wall thickening visualized. No sonographic Murphy sign noted by sonographer. Common bile duct: Diameter: Normal at 3 mm Liver: The hepatic contour appears mildly nodular.  There is relatively increased echogenicity and coarsening of the echotexture. No discrete mass or lesion identified. Portal vein is patent on color Doppler imaging with normal direction of blood flow towards the liver. IVC: No abnormality visualized. Pancreas: Visualized portion unremarkable. Spleen: Marked splenomegaly. The spleen measures up to 18.7 cm with a volume of 1302 cubic cm (top-normal is 411 cubic cm). Right Kidney: Length: 12.1 cm. Echogenicity within normal limits. No mass or hydronephrosis visualized. Left Kidney: Length: 12.0 cm. Echogenicity within normal limits. No mass or hydronephrosis visualized. Abdominal aorta: No aneurysm visualized. Other findings: Trace ascites. IMPRESSION: 1. Hepatic cirrhosis with marked splenomegaly suggesting underlying portal hypertension. 2. Only trace perihepatic ascites, insufficient for paracentesis. 3. No discrete hepatic lesion identified. Electronically Signed   By: Malachy Moan M.D.   On: 01/31/2018 13:42   EKG: Personally reviewed. NSR seen.   Assessment/Plan Principal Problem:   LLL pneumonia (HCC) Active Problems:   Alcoholic cirrhosis of liver with ascites (HCC)   Alcohol abuse   Anemia, unspecified   Thrombocytopenia (HCC)   Splenomegaly   Cirrhosis (HCC)   Hypomagnesemia   Ascites    Esophageal reflux   Hypokalemia  1. LLL Pneumonia -this pneumonia is felt to be community-acquired and will be treated with ceftriaxone and azithromycin.  Supportive therapy has been ordered.  Will monitor pulse ox and provide oxygen as needed. Blood cultures will be ordered STAT and will follow.  I was able to tell the RN to get the blood cultures FIRST prior to starting the antibiotics. She verbalized understanding. Treat symptoms as ordered. 2. Alcoholic cirrhosis with ascites- thought to be secondary to combination of chronic alcoholism and hepatic steatosis.  The patient did have an abdominal ultrasound but there was no mention of steatosis in the liver. GI has been consulted as patient says he has been lost to follow up.  Given his young age I think that GI involvement now could be meaningful for this patient and his overall prognosis.  He could be a good candidate for a liver transplant as he has completely stopped alcohol and given his young age and he is willing to have a liver transplant done.  Will defer to GI evaluation and recommendations.  He is grossly volume overloaded.  He will be placed on fluid restriction and IV lasix has been ordered.   Spoke with IR Radiologist.  He does see enough ascitic fluid where a therapeutic paracentesis could be beneficial for the patient.  I would like to do this tomorrow after treatment for the pneumonia has been started and transfusion completed.   3. Splenomegaly - I am concerned about splenic sequestration.  He does have schistocytes on peripheral smear.  I have asked for a hematology consult.   4. Chronic alcoholism-the patient says that he completely stopped alcohol 1 month ago and has not restarted.  At this time I have low concern that he will go though acute withdrawal, I have ordered a CIWA for completeness but only PRN lorazepam will be given based on CIWA score.  The patient's alcohol level is negative at this time he is not showing any signs of  alcohol withdrawal but will need to monitor closely.  I did order a UDS for completeness.  5. Thrombocytopenia-severe-no active bleeding at this time however will Hemoccult stools.  Holding all heparin products at this time.  Hematology consult is been requested.  Recheck CBC in AM.   6. Esophageal reflux-Protonix has been ordered twice daily. 7. Anemia, unspecified- no  signs of active bleeding at this time but will follow closely.  The patient has been ordered for red blood cell transfusion.  Will follow hemoglobin closely.  Hematology consult has been requested.  There is some concern about splenic sequestration.  1 unit PRBC has been ordered.  Recheck CBC in AM.   8. Chronic alcohol abuse-CIWA protocol has been ordered.  Continue multivitamin, folic acid and thiamine. 9. Hypomagnesemia - IV replacement ordered.   10. Hypokalemia - oral replacement ordered. Magnesium replacement.   DVT Prophylaxis: SCDs Code Status: Full Family Communication: Patient updated at bedside Disposition Plan: The patient will require ongoing inpatient acute medical care.  Time spent: 63 minutes  Standley Dakins, MD [facility] Pager [phone]  If 7PM-7AM, please contact night-coverage [email or web address] Password [facility] 01/31/2018, 2:39 PM

## 2018-01-31 NOTE — ED Notes (Signed)
Date and time results received: 01/31/18 12:32 PM  (use smartphrase ".now" to insert current time)  Test: hgb Critical Value: 6.2 Name of Provider Notified: Dr Adriana Simas   Orders Received? Or Actions Taken?:

## 2018-01-31 NOTE — ED Provider Notes (Signed)
[facility] EMERGENCY DEPARTMENT Provider Note   CSN: [id number] Arrival date & time: 01/31/18  1043     History   Chief Complaint Chief Complaint  Patient presents with  . Abdominal Pain    swelling     HPI Grant [name] is a 37 y.o. male.  HPI  Pt was seen at 1225. Per pt, c/o gradual onset and worsening of persistent abd "swelling" for the past 2 months. Has been associated with increasing generalized fatigue, increasing pedal edema. Pt endorses hx of cirrhosis, has not had GI evaluation or repeat paracentesis since 2013. Denies CP/SOB, no back pain, no black or blood in stools, no N/V/D, no fevers, no abd pain.    Past Medical History:  Diagnosis Date  . Ascites 2013  . Campylobacter diarrhea 04/2016   bloody diarrhea.   . 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 Hepatitis B and C serologies negative 06/2016.     Marland Kitchen Splenomegaly 2013  . Thrombocytopenia (HCC) 2013    Patient Active Problem List   Diagnosis Date Noted  . Esophageal reflux   . Morbid obesity due to excess calories (HCC)   . Dehydration 05/09/2016  . Alcoholic cirrhosis of liver with ascites (HCC) 05/09/2016  . Diarrhea 05/09/2016  . Campylobacter diarrhea 05/09/2016  . Alcohol abuse 05/09/2016  . Hematochezia     Past Surgical History:  Procedure Laterality Date  . ESOPHAGOGASTRODUODENOSCOPY  08/2015   in [place] at [facility] associated hospital.  "Cauterized a vein or 2" (? esophageal varices?)  . PARACENTESIS  2013   In [place] at [facility] associated hospital.         Home Medications    Prior to Admission medications   Medication Sig Start Date End Date Taking? Authorizing Provider  folic acid (FOLVITE) 1 MG tablet Take 1 tablet (1 mg total) by mouth daily. 05/10/16  Yes Vassie Loll, MD  Multiple Vitamin (MULTIVITAMIN WITH MINERALS) TABS tablet Take 1 tablet by mouth daily.   Yes [provider]  omeprazole (PRILOSEC) 20 MG capsule  Take 20 mg by mouth 2 (two) times daily before a meal.   Yes [provider]  propranolol (INDERAL) 20 MG tablet Take 1 tablet (20 mg total) by mouth 2 (two) times daily. 05/10/16  Yes Vassie Loll, MD  thiamine 100 MG tablet Take 1 tablet (100 mg total) by mouth daily. 05/10/16  Yes Vassie Loll, MD    Family History Family History  Problem Relation Age of Onset  . Diabetes Father   . Hypertension Father   . Diabetes Other   . Cancer Neg Hx     Social History Social History   Tobacco Use  . Smoking status: Never Smoker  . Smokeless tobacco: Never Used  Substance Use Topics  . Alcohol use: Yes    Alcohol/week: 12.0 oz    Types: 20 Cans of beer per week    Comment: in 04/2016 admits to 6, 12 oz apple ales per day.   . Drug use: No     Allergies   Patient has no known allergies.   Review of Systems Review of Systems ROS: Statement: All systems negative except as marked or noted in the HPI; Constitutional: Negative for fever and chills. +generalized fatigue.; ; Eyes: Negative for eye pain, redness and discharge. ; ; ENMT: Negative for ear pain, hoarseness, nasal congestion, sinus pressure and sore throat. ; ; Cardiovascular: Negative for chest pain, palpitations, diaphoresis, dyspnea and +  peripheral edema. ; ; Respiratory: Negative for cough, wheezing and stridor. ; ; Gastrointestinal: +abd "swelling." Negative for nausea, vomiting, diarrhea, abdominal pain, blood in stool, hematemesis, jaundice and rectal bleeding. . ; ; Genitourinary: Negative for dysuria, flank pain and hematuria. ; ; Musculoskeletal: Negative for back pain and neck pain. Negative for swelling and trauma.; ; Skin: Negative for pruritus, rash, abrasions, blisters, bruising and skin lesion.; ; Neuro: Negative for headache, lightheadedness and neck stiffness. Negative for weakness, altered level of consciousness, altered mental status, extremity weakness, paresthesias, involuntary movement, seizure and  syncope.       Physical Exam Updated Vital Signs BP 133/73 (BP Location: Right Arm)   Pulse 91   Temp 98 F (36.7 C) (Oral)   Resp 14   Ht  (1.778 m)   Wt 130.3 kg (287 lb 5 oz) Comment: Simultaneous filing. User may not have seen previous data.  SpO2 94%   BMI 41.23 kg/m   Physical Exam 1230: Physical examination:  Nursing notes reviewed; Vital signs and O2 SAT reviewed;  Constitutional: Well developed, Well nourished, Well hydrated, In no acute distress; Head:  Normocephalic, atraumatic; Eyes: EOMI, PERRL, No scleral icterus; ENMT: Mouth and pharynx normal, Mucous membranes moist; Neck: Supple, Full range of motion, No lymphadenopathy; Cardiovascular: Regular rate and rhythm, No gallop; Respiratory: Breath sounds clear & equal bilaterally, No wheezes.  Speaking full sentences with ease, Normal respiratory effort/excursion; Chest: Nontender, Movement normal; Abdomen:  Nontender, +distended, Normal bowel sounds; Genitourinary: No CVA tenderness; Extremities: Peripheral pulses normal, No tenderness, +2 pedal edema bilat. No calf asymmetry.; Neuro: AA&Ox3, Major CN grossly intact.  Speech clear. No gross focal motor or sensory deficits in extremities.; Skin: Color normal, Warm, Dry.   ED Treatments / Results  Labs (all labs ordered are listed, but only abnormal results are displayed)   EKG EKG Interpretation  Date/Time:  Monday Jan 31 2018 14:12:52 EDT Ventricular Rate:  71 PR Interval:    QRS Duration: 109 QT Interval:  455 QTC Calculation: 495 R Axis:   17 Text Interpretation:  Sinus rhythm Consider inferior infarct Artifact No old tracing to compare Confirmed by Samuel Jester [phone]) on 01/31/2018 2:22:28 PM   Radiology   Procedures Procedures (including critical care time)  Medications Ordered in ED Medications - No data to display   Initial Impression / Assessment and Plan / ED Course  I have reviewed the triage vital signs and the nursing  notes.  Pertinent labs & imaging results that were available during my care of the patient were reviewed by me and considered in my medical decision making (see chart for details).  MDM Reviewed: previous chart, nursing note and vitals Reviewed previous: labs and ECG Interpretation: labs, ultrasound, x-ray and ECG Total time providing critical care: 30-74 minutes. This excludes time spent performing separately reportable procedures and services. Consults: admitting MD   CRITICAL CARE Performed by: Laray Anger Total critical care time: 35 minutes Critical care time was exclusive of separately billable procedures and treating other patients. Critical care was necessary to treat or prevent imminent or life-threatening deterioration. Critical care was time spent personally by me on the following activities: development of treatment plan with patient and/or surrogate as well as nursing, discussions with consultants, evaluation of patient's response to treatment, examination of patient, obtaining history from patient or surrogate, ordering and performing treatments and interventions, ordering and review of laboratory studies, ordering and review of radiographic studies, pulse oximetry and re-evaluation of patient's condition.   Results  for orders placed or performed during the hospital encounter of 01/31/18  Lipase, blood  Result Value Ref Range   Lipase 38 11 - 51 U/L  Comprehensive metabolic panel  Result Value Ref Range   Sodium 135 135 - 145 mmol/L   Potassium 3.3 (L) 3.5 - 5.1 mmol/L   Chloride 101 101 - 111 mmol/L   CO2 26 22 - 32 mmol/L   Glucose, Bld 95 65 - 99 mg/dL   BUN 5 (L) 6 - 20 mg/dL   Creatinine, Ser 1.61 (L) 0.61 - 1.24 mg/dL   Calcium 8.2 (L) 8.9 - 10.3 mg/dL   Total Protein 7.0 6.5 - 8.1 g/dL   Albumin 2.7 (L) 3.5 - 5.0 g/dL   AST 74 (H) 15 - 41 U/L   ALT 22 17 - 63 U/L   Alkaline Phosphatase 150 (H) 38 - 126 U/L   Total Bilirubin 3.3 (H) 0.3 - 1.2 mg/dL    GFR calc non Af Amer >60 >60 mL/min   GFR calc Af Amer >60 >60 mL/min   Anion gap 8 5 - 15  CBC  Result Value Ref Range   WBC 2.8 (L) 4.0 - 10.5 K/uL   RBC 3.07 (L) 4.22 - 5.81 MIL/uL   Hemoglobin 6.2 (LL) 13.0 - 17.0 g/dL   HCT 09.6 (L) 04.5 - 40.9 %   MCV 73.6 (L) 78.0 - 100.0 fL   MCH 20.2 (L) 26.0 - 34.0 pg   MCHC 27.4 (L) 30.0 - 36.0 g/dL   RDW 81.1 (H) 91.4 - 78.2 %   Platelets 85 (L) 150 - 400 K/uL  Urinalysis, Routine w reflex microscopic  Result Value Ref Range   Color, Urine AMBER (A) YELLOW   APPearance CLEAR CLEAR   Specific Gravity, Urine 1.019 1.005 - 1.030   pH 6.0 5.0 - 8.0   Glucose, UA NEGATIVE NEGATIVE mg/dL   Hgb urine dipstick MODERATE (A) NEGATIVE   Bilirubin Urine MODERATE (A) NEGATIVE   Ketones, ur NEGATIVE NEGATIVE mg/dL   Protein, ur NEGATIVE NEGATIVE mg/dL   Nitrite NEGATIVE NEGATIVE   Leukocytes, UA NEGATIVE NEGATIVE   RBC / HPF 21-50 0 - 5 RBC/hpf   WBC, UA 0-5 0 - 5 WBC/hpf   Bacteria, UA NONE SEEN NONE SEEN   Squamous Epithelial / LPF 0-5 0 - 5   Mucus PRESENT    Uric Acid Crys, UA PRESENT   Protime-INR  Result Value Ref Range   Prothrombin Time 17.8 (H) 11.4 - 15.2 seconds   INR 1.48   Ethanol  Result Value Ref Range   Alcohol, Ethyl (B) <10 <10 mg/dL  Differential  Result Value Ref Range   Neutro Abs 1.1 (L) 1.7 - 7.7 K/uL   Lymphs Abs 0.8 0.7 - 4.0 K/uL   Monocytes Absolute 0.6 0.1 - 1.0 K/uL   Eosinophils Absolute 0.2 0.0 - 0.7 K/uL   Basophils Absolute 0.1 0.0 - 0.1 K/uL   Neutrophils Relative % 39 %   Lymphocytes Relative 30 %   Monocytes Relative 22 %   Eosinophils Relative 7 %   Basophils Relative 2 %   Band Neutrophils 0 %   Metamyelocytes Relative 0 %   Myelocytes 0 %   Promyelocytes Relative 0 %   Blasts 0 %   nRBC 0 0 /100 WBC   Other 0 %   RBC Morphology Schistocytes present   Magnesium  Result Value Ref Range   Magnesium 1.3 (L) 1.7 - 2.4 mg/dL  Type and  screen  Result Value Ref Range   ABO/RH(D) A POS     Antibody Screen NEG    Sample Expiration      02/03/2018 Performed at [facility], [address], [place], [place] [address]    Dg Chest 2 View Result Date: 01/31/2018 CLINICAL DATA:  Abdominal swelling EXAM: CHEST - 2 VIEW COMPARISON:  None. FINDINGS: Cardiac shadow is within normal limits. The lungs are well aerated bilaterally. Left lower lobe infiltrate with associated effusion is seen. IMPRESSION: Left lower lobe pneumonia Electronically Signed   By: Alcide Clever M.D.   On: 01/31/2018 13:45   US Abdomen Complete Result Date: 01/31/2018 CLINICAL DATA:  37 year old male with abdominal swelling, history of alcoholic cirrhosis and ascites EXAM: ABDOMEN ULTRASOUND COMPLETE COMPARISON:  None. FINDINGS: Gallbladder: No gallstones or wall thickening visualized. No sonographic Murphy sign noted by sonographer. Common bile duct: Diameter: Normal at 3 mm Liver: The hepatic contour appears mildly nodular. There is relatively increased echogenicity and coarsening of the echotexture. No discrete mass or lesion identified. Portal vein is patent on color Doppler imaging with normal direction of blood flow towards the liver. IVC: No abnormality visualized. Pancreas: Visualized portion unremarkable. Spleen: Marked splenomegaly. The spleen measures up to 18.7 cm with a volume of 1302 cubic cm (top-normal is 411 cubic cm). Right Kidney: Length: 12.1 cm. Echogenicity within normal limits. No mass or hydronephrosis visualized. Left Kidney: Length: 12.0 cm. Echogenicity within normal limits. No mass or hydronephrosis visualized. Abdominal aorta: No aneurysm visualized. Other findings: Trace ascites. IMPRESSION: 1. Hepatic cirrhosis with marked splenomegaly suggesting underlying portal hypertension. 2. Only trace perihepatic ascites, insufficient for paracentesis. 3. No discrete hepatic lesion identified. Electronically Signed   By: Malachy Moan M.D.   On: 01/31/2018 13:42  ' Results for SORA, OLIVO (MRN [id number])  as of 01/31/2018 14:04  Ref. Range 05/09/2016 06:24 05/10/2016 04:49 01/31/2018 11:40  Hemoglobin Latest Ref Range: 13.0 - 17.0 g/dL 19.1 (L) 9.6 (L) 6.2 (LL)  HCT Latest Ref Range: 39.0 - 52.0 % 31.5 (L) 30.5 (L) 22.6 (L)  Platelets Latest Ref Range: 150 - 400 K/uL 110 (L) 106 (L) 85 (L)     1410:  H/H lower than previous; will transfuse PRBC's. T/C from [facility]. Tyron Russell: he viewed the [place], it does have enough ascitic fluid for drainage. Pt is not in any distress at this time; will hold paracentesis at this time. Will start IV abx for CAP. Dx and testing d/w pt and family.  Questions answered.  Verb understanding, agreeable to admit.  T/C returned from [facility]. Laural Benes, case discussed, including:  HPI, pertinent PM/SHx, VS/PE, dx testing, ED course and treatment:  Agreeable to admit.      Final Clinical Impressions(s) / ED Diagnoses   Final diagnoses:  None    ED Discharge Orders    None       Samuel Jester, DO 02/02/18 2023

## 2018-01-31 NOTE — ED Triage Notes (Signed)
Pt has HX of cirrhosis.  Pt states having progressive abdominal swelling x1 month.

## 2018-02-01 ENCOUNTER — Encounter (HOSPITAL_COMMUNITY): Payer: Self-pay | Admitting: Gastroenterology

## 2018-02-01 ENCOUNTER — Inpatient Hospital Stay (HOSPITAL_COMMUNITY): Payer: Self-pay

## 2018-02-01 DIAGNOSIS — D649 Anemia, unspecified: Secondary | ICD-10-CM

## 2018-02-01 DIAGNOSIS — K7031 Alcoholic cirrhosis of liver with ascites: Principal | ICD-10-CM

## 2018-02-01 LAB — TYPE AND SCREEN
ABO/RH(D): A POS
Antibody Screen: NEGATIVE
UNIT DIVISION: 0
Unit division: 0

## 2018-02-01 LAB — LACTATE DEHYDROGENASE, PLEURAL OR PERITONEAL FLUID: LD, Fluid: 24 U/L — ABNORMAL HIGH (ref 3–23)

## 2018-02-01 LAB — COMPREHENSIVE METABOLIC PANEL
ALT: 19 U/L (ref 17–63)
AST: 66 U/L — AB (ref 15–41)
Albumin: 2.5 g/dL — ABNORMAL LOW (ref 3.5–5.0)
Alkaline Phosphatase: 129 U/L — ABNORMAL HIGH (ref 38–126)
Anion gap: 7 (ref 5–15)
BUN: 5 mg/dL — AB (ref 6–20)
CHLORIDE: 103 mmol/L (ref 101–111)
CO2: 26 mmol/L (ref 22–32)
Calcium: 8.1 mg/dL — ABNORMAL LOW (ref 8.9–10.3)
Creatinine, Ser: 0.48 mg/dL — ABNORMAL LOW (ref 0.61–1.24)
Glucose, Bld: 82 mg/dL (ref 65–99)
POTASSIUM: 3.5 mmol/L (ref 3.5–5.1)
SODIUM: 136 mmol/L (ref 135–145)
Total Bilirubin: 3.3 mg/dL — ABNORMAL HIGH (ref 0.3–1.2)
Total Protein: 6.4 g/dL — ABNORMAL LOW (ref 6.5–8.1)

## 2018-02-01 LAB — BODY FLUID CELL COUNT WITH DIFFERENTIAL
Eos, Fluid: 0 %
Lymphs, Fluid: 54 %
Monocyte-Macrophage-Serous Fluid: 42 % — ABNORMAL LOW (ref 50–90)
Neutrophil Count, Fluid: 4 % (ref 0–25)
Other Cells, Fluid: REACTIVE %
WBC FLUID: 74 uL (ref 0–1000)

## 2018-02-01 LAB — BPAM RBC
BLOOD PRODUCT EXPIRATION DATE: 201906042359
Blood Product Expiration Date: 201906042359
ISSUE DATE / TIME: 201905061440
ISSUE DATE / TIME: 201905061709
UNIT TYPE AND RH: 6200
Unit Type and Rh: 6200

## 2018-02-01 LAB — CBC WITH DIFFERENTIAL/PLATELET
BASOS ABS: 0 10*3/uL (ref 0.0–0.1)
Basophils Relative: 1 %
EOS ABS: 0.3 10*3/uL (ref 0.0–0.7)
EOS PCT: 8 %
HCT: 23.8 % — ABNORMAL LOW (ref 39.0–52.0)
Hemoglobin: 7 g/dL — ABNORMAL LOW (ref 13.0–17.0)
LYMPHS ABS: 1.1 10*3/uL (ref 0.7–4.0)
LYMPHS PCT: 30 %
MCH: 22.1 pg — ABNORMAL LOW (ref 26.0–34.0)
MCHC: 29.4 g/dL — ABNORMAL LOW (ref 30.0–36.0)
MCV: 75.1 fL — ABNORMAL LOW (ref 78.0–100.0)
Monocytes Absolute: 0.8 10*3/uL (ref 0.1–1.0)
Monocytes Relative: 24 %
NEUTROS PCT: 37 %
Neutro Abs: 1.4 10*3/uL — ABNORMAL LOW (ref 1.7–7.7)
PLATELETS: 79 10*3/uL — AB (ref 150–400)
RBC: 3.17 MIL/uL — AB (ref 4.22–5.81)
RDW: 21 % — ABNORMAL HIGH (ref 11.5–15.5)
WBC: 3.6 10*3/uL — AB (ref 4.0–10.5)

## 2018-02-01 LAB — LIPID PANEL
CHOLESTEROL: 131 mg/dL (ref 0–200)
HDL: 17 mg/dL — ABNORMAL LOW (ref >40)
LDL Cholesterol: 98 mg/dL (ref 0–99)
TRIGLYCERIDES: 78 mg/dL (ref <150)
Total CHOL/HDL Ratio: 7.7 RATIO
VLDL: 16 mg/dL (ref 0–40)

## 2018-02-01 LAB — PROTIME-INR
INR: 1.54
Prothrombin Time: 18.4 seconds — ABNORMAL HIGH (ref 11.4–15.2)

## 2018-02-01 LAB — AMMONIA: Ammonia: 89 umol/L — ABNORMAL HIGH (ref 9–35)

## 2018-02-01 LAB — ALBUMIN, PLEURAL OR PERITONEAL FLUID

## 2018-02-01 LAB — GRAM STAIN: GRAM STAIN: NONE SEEN

## 2018-02-01 LAB — LACTATE DEHYDROGENASE: LDH: 120 U/L (ref 98–192)

## 2018-02-01 LAB — RETICULOCYTES
RBC.: 3.18 MIL/uL — ABNORMAL LOW (ref 4.22–5.81)
RETIC COUNT ABSOLUTE: 60.4 10*3/uL (ref 19.0–186.0)
RETIC CT PCT: 1.9 % (ref 0.4–3.1)

## 2018-02-01 LAB — MAGNESIUM: MAGNESIUM: 1.6 mg/dL — AB (ref 1.7–2.4)

## 2018-02-01 LAB — IRON AND TIBC
Iron: 24 ug/dL — ABNORMAL LOW (ref 45–182)
SATURATION RATIOS: 7 % — AB (ref 17.9–39.5)
TIBC: 335 ug/dL (ref 250–450)
UIBC: 311 ug/dL

## 2018-02-01 LAB — AMYLASE, PLEURAL OR PERITONEAL FLUID: Amylase, Fluid: 13 U/L

## 2018-02-01 LAB — PROTEIN, PLEURAL OR PERITONEAL FLUID

## 2018-02-01 LAB — FERRITIN: Ferritin: 11 ng/mL — ABNORMAL LOW (ref 24–336)

## 2018-02-01 LAB — FOLATE: Folate: 29 ng/mL (ref >5.9)

## 2018-02-01 LAB — VITAMIN B12: VITAMIN B 12: 1282 pg/mL — AB (ref 180–914)

## 2018-02-01 MED ORDER — POTASSIUM CHLORIDE CRYS ER 20 MEQ PO TBCR
50.0000 meq | EXTENDED_RELEASE_TABLET | Freq: Two times a day (BID) | ORAL | Status: DC
  Administered 2018-02-01 – 2018-02-02 (×3): 50 meq via ORAL
  Filled 2018-02-01 (×2): qty 1
  Filled 2018-02-01: qty 2

## 2018-02-01 MED ORDER — PANTOPRAZOLE SODIUM 40 MG PO TBEC
40.0000 mg | DELAYED_RELEASE_TABLET | Freq: Two times a day (BID) | ORAL | Status: DC
  Administered 2018-02-01 – 2018-02-04 (×6): 40 mg via ORAL
  Filled 2018-02-01 (×6): qty 1

## 2018-02-01 MED ORDER — FUROSEMIDE 10 MG/ML IJ SOLN
40.0000 mg | Freq: Two times a day (BID) | INTRAMUSCULAR | Status: DC
  Administered 2018-02-02 – 2018-02-03 (×3): 40 mg via INTRAVENOUS
  Filled 2018-02-01 (×3): qty 4

## 2018-02-01 MED ORDER — FUROSEMIDE 10 MG/ML IJ SOLN
60.0000 mg | Freq: Two times a day (BID) | INTRAMUSCULAR | Status: DC
  Filled 2018-02-01: qty 6

## 2018-02-01 MED ORDER — MAGNESIUM SULFATE 4 GM/100ML IV SOLN
4.0000 g | Freq: Once | INTRAVENOUS | Status: AC
  Administered 2018-02-01: 4 g via INTRAVENOUS
  Filled 2018-02-01: qty 100

## 2018-02-01 MED ORDER — SPIRONOLACTONE 25 MG PO TABS
100.0000 mg | ORAL_TABLET | Freq: Two times a day (BID) | ORAL | Status: DC
  Administered 2018-02-02 – 2018-02-03 (×3): 100 mg via ORAL
  Filled 2018-02-01 (×3): qty 4

## 2018-02-01 NOTE — Progress Notes (Signed)
PROGRESS NOTE    Grant [name]  ZOX:[id number]  DOB: 13-Jun-1981  DOA: 01/31/2018 PCP: Patient, No Pcp Per   Brief Admission Hx: Grant [name] is a 37 y.o. male alcoholic with history of cirrhosis secondary to chronic alcoholism and thought to be related to hepatic steatosis reported that he has not had a paracentesis done since about 2013.  He had been doing fairly well and has been fairly stable.  He reports for the past several months he has noticed increasing abdominal distention, malaise and fatigue.   MDM/Assessment & Plan:    1. LLL Pneumonia -this pneumonia is felt to be community-acquired and will be treated with ceftriaxone and azithromycin.  Supportive therapy has been ordered.  Will monitor pulse ox and provide oxygen as needed. Follow cultures.  2. Alcoholic cirrhosis with ascites- thought to be secondary to combination of chronic alcoholism and hepatic steatosis.  The patient did have an abdominal ultrasound but there was no mention of steatosis in the liver. GI has been consulted as patient says he has been lost to follow up.  Given his young age I think that GI involvement now could be meaningful for this patient and his overall prognosis.  He could be a good candidate for a liver transplant as he has completely stopped alcohol and given his young age and he is willing to have a liver transplant done.  Will defer to GI evaluation and recommendations.  He is grossly volume overloaded.  He will be placed on fluid restriction and IV lasix has been ordered.   Spoke with IR Radiologist.  He does see enough ascitic fluid where a therapeutic paracentesis could be beneficial for the patient.  I would like to do this tomorrow after treatment for the pneumonia has been started and transfusion completed.   3. Splenomegaly - I am concerned about splenic sequestration.  He does have schistocytes on peripheral smear.  I have asked for a hematology consult.  I spoke with hematologist who asked for  additional labs: anemia panel ordered.  Will see later today.  4. Chronic alcoholism-the patient says that he completely stopped alcohol 1 month ago and has not restarted.  At this time I have low concern that he will go though acute withdrawal, I have ordered a CIWA for completeness but only PRN lorazepam will be given based on CIWA score.  The patient's alcohol level is negative at this time he is not showing any signs of alcohol withdrawal but will need to monitor closely.  I did order a UDS for completeness.  5. Thrombocytopenia-severe-no active bleeding at this time however will Hemoccult stools.  Holding all heparin products at this time.  Hematology consult is been requested.  Follow CBC.    6. Esophageal reflux-Protonix has been ordered. 7. Anemia, unspecified- no signs of active bleeding at this time but will follow closely.  The patient has been ordered for red blood cell transfusion.  Will follow hemoglobin closely.  Hematology consult has been requested.  There is some concern about splenic sequestration.  1 unit PRBC has been ordered. Anemia panel and LDH has been ordered per hematologist request.    8. Chronic alcohol abuse-CIWA protocol has been ordered.  Continue multivitamin, folic acid and thiamine. 9. Hypomagnesemia - IV replacement ordered.   10. Hypokalemia - oral replacement ordered. Magnesium replacement.   DVT Prophylaxis: SCDs Code Status: Full Family Communication: Patient updated at bedside Disposition Plan: The patient will require ongoing inpatient acute medical care.  Subjective: Pt feels  about the same as yesterday.  He has been urinating more with the lasix.   Objective: Vitals:   01/31/18 2016 01/31/18 2022 01/31/18 2122 02/01/18 0509  BP:  117/71 131/77 118/73  Pulse:  83 80 80  Resp:   16   Temp:  98.8 F (37.1 C) 98.4 F (36.9 C) 98.3 F (36.8 C)  TempSrc:  Oral Oral Oral  SpO2: 91% 99% 100% 95%  Weight:      Height:        Intake/Output Summary  (Last 24 hours) at 02/01/2018 1610 Last data filed at 01/31/2018 1738 Gross per 24 hour  Intake 792.92 ml  Output 700 ml  Net 92.92 ml   Filed Weights   01/31/18 1051 01/31/18 1601  Weight: 130.3 kg (287 lb 5 oz) 130.2 kg (287 lb)   REVIEW OF SYSTEMS  As per history otherwise all reviewed and reported negative  Exam:  General exam: chronically ill appearing, no scleral icterus, NAD. Cooperative.  Respiratory system: BBS shallow but clear. No increased work of breathing. Cardiovascular system: S1 & S2 heard.  Bilateral LE edema. Gastrointestinal system: Abdomen is very distended, soft and nontender. Normal bowel sounds heard. Central nervous system: Alert and oriented. No focal neurological deficits. Extremities: 2+ edema bilateral LEs.  Data Reviewed: Basic Metabolic Panel: Recent Labs  Lab 01/31/18 1140 01/31/18 1235  NA 135  --   K 3.3*  --   CL 101  --   CO2 26  --   GLUCOSE 95  --   BUN 5*  --   CREATININE 0.46*  --   CALCIUM 8.2*  --   MG  --  1.3*   Liver Function Tests: Recent Labs  Lab 01/31/18 1140  AST 74*  ALT 22  ALKPHOS 150*  BILITOT 3.3*  PROT 7.0  ALBUMIN 2.7*   Recent Labs  Lab 01/31/18 1140  LIPASE 38   No results for input(s): AMMONIA in the last 168 hours. CBC: Recent Labs  Lab 01/31/18 1140 01/31/18 1235  WBC 2.8*  --   NEUTROABS  --  1.1*  HGB 6.2*  --   HCT 22.6*  --   MCV 73.6*  --   PLT 85*  --    Cardiac Enzymes: No results for input(s): CKTOTAL, CKMB, CKMBINDEX, TROPONINI in the last 168 hours. CBG (last 3)  No results for input(s): GLUCAP in the last 72 hours. Recent Results (from the past 240 hour(s))  Culture, blood (Routine X 2) w Reflex to ID Panel     Status: None (Preliminary result)   Collection Time: 01/31/18  3:06 PM  Result Value Ref Range Status   Specimen Description LEFT ANTECUBITAL  Final   Special Requests   Final    BOTTLES DRAWN AEROBIC AND ANAEROBIC Blood Culture adequate volume Performed at [facility], [address] [place], [place] [address]    Culture PENDING  Incomplete   Report Status PENDING  Incomplete  Culture, blood (Routine X 2) w Reflex to ID Panel     Status: None (Preliminary result)   Collection Time: 01/31/18  3:06 PM  Result Value Ref Range Status   Specimen Description RIGHT ANTECUBITAL  Final   Special Requests   Final    BOTTLES DRAWN AEROBIC AND ANAEROBIC Blood Culture adequate volume Performed at [facility], [address] [place], [place] [address]    Culture PENDING  Incomplete   Report Status PENDING  Incomplete     Studies: Dg Chest  2 View  Result Date: 01/31/2018 CLINICAL DATA:  Abdominal swelling EXAM: CHEST - 2 VIEW COMPARISON:  None. FINDINGS: Cardiac shadow is within normal limits. The lungs are well aerated bilaterally. Left lower lobe infiltrate with associated effusion is seen. IMPRESSION: Left lower lobe pneumonia Electronically Signed   By: Alcide Clever M.D.   On: 01/31/2018 13:45   US Abdomen Complete  Result Date: 01/31/2018 CLINICAL DATA:  37 year old male with abdominal swelling, history of alcoholic cirrhosis and ascites EXAM: ABDOMEN ULTRASOUND COMPLETE COMPARISON:  None. FINDINGS: Gallbladder: No gallstones or wall thickening visualized. No sonographic Murphy sign noted by sonographer. Common bile duct: Diameter: Normal at 3 mm Liver: The hepatic contour appears mildly nodular. There is relatively increased echogenicity and coarsening of the echotexture. No discrete mass or lesion identified. Portal vein is patent on color Doppler imaging with normal direction of blood flow towards the liver. IVC: No abnormality visualized. Pancreas: Visualized portion unremarkable. Spleen: Marked splenomegaly. The spleen measures up to 18.7 cm with a volume of 1302 cubic cm (top-normal is 411 cubic cm). Right Kidney: Length: 12.1 cm. Echogenicity within normal limits. No mass or hydronephrosis visualized. Left Kidney: Length: 12.0 cm. Echogenicity within  normal limits. No mass or hydronephrosis visualized. Abdominal aorta: No aneurysm visualized. Other findings: Trace ascites. IMPRESSION: 1. Hepatic cirrhosis with marked splenomegaly suggesting underlying portal hypertension. 2. Only trace perihepatic ascites, insufficient for paracentesis. 3. No discrete hepatic lesion identified. Electronically Signed   By: Malachy Moan M.D.   On: 01/31/2018 13:42   Scheduled Meds: . folic acid  1 mg Oral Daily  . furosemide  40 mg Intravenous Q12H  . multivitamin with minerals  1 tablet Oral Daily  . pantoprazole  40 mg Oral BID  . pneumococcal 23 valent vaccine  0.5 mL Intramuscular Tomorrow-1000  . potassium chloride  40 mEq Oral BID  . propranolol  20 mg Oral BID  . sodium chloride flush  3 mL Intravenous Q12H  . thiamine  100 mg Oral Daily   Continuous Infusions: . sodium chloride    . azithromycin Stopped (01/31/18 1610)  . cefTRIAXone (ROCEPHIN)  IV Stopped (01/31/18 1539)    Principal Problem:   LLL pneumonia (HCC) Active Problems:   Alcoholic cirrhosis of liver with ascites (HCC)   Alcohol abuse   Anemia, unspecified   Thrombocytopenia (HCC)   Splenomegaly   Cirrhosis (HCC)   Hypomagnesemia   Ascites   Esophageal reflux   Hypokalemia  Time spent:   Standley Dakins, MD, FAAFP [facility] Pager [phone]  If 7PM-7AM, please contact night-coverage [email or web address] Password [email or web address] 02/01/2018, 6:13 AM    LOS: 1 day

## 2018-02-01 NOTE — Consult Note (Addendum)
Referring Provider: Dr. Wynetta Emery  Primary Care Physician:  Patient, No Pcp Per Primary Gastroenterologist:  Had recently seen GI in [place], Dr. Britta Mccreedy in [place] prior. Remote past 2013 [facility], [place].   Date of Admission: 01/31/18 Date of Consultation: 02/01/18  Reason for Consultation:  Thrombocytopenia and anemia   HPI:  Grant [name] is a 37 y.o. 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 He has been followed by Dr. Britta Mccreedy in the past, undergoing a screening EGD approximately 3 years ago and started on propanolol. He was recently seen in [place], [place], by a GI practice. However, he states he would like to transfer his care to [facility] as the visit was non-productive and he felt he needed more expedited care. He noted chronic swelling of his abdomen, worsening of the past 1.5 months. This prompted ED evaluation. Found to have pneumonia on admission. Bilirubin 3.3, Alk Phos 150, AST 74 on admission. INR 1.48. MELD Na 18, Child Pugh C.   Patient has not been on any chronic diuretic therapy. He notes history of chronic alcohol use but weaned himself down from quantity and frequency over the past few months, with his last drink one month ago. Denies any drug use. While inpatient Aug 2017 at [facility], viral serologies were assessed.  Hep C antibody negative, Hep B surface antigen negative, Hep B surface antibody non-reactive, Hep B core total antibody negative. Unknown if immune to Hepatitis A. He has not had extensive serologies that I can gather and it has been felt historically that his liver disease was secondary to fatty liver/ETOH use.   He notes a first ever paracentesis in Jan 2013, without any subsequent need for LVAPs. Jan 20113 1500 cc removed.  No history of SBP. No confusion, mental status changes. No abdominal pain. Slight cough for a few  weeks. Notes he has an umbilical hernia that sometimes twinges with discomfort with movement. No hematochezia or melena. Appetite is "usually" pretty good. Sometimes has no appetite. Lower extremity edema chronic. US abdomen yesterday with cirrhosis, marked splenomegaly. He tells me that his father carries weight in his abdomen and states "if you saw my dad, you would see it is hereditary".    [place]. Has avascular necrosis of right hip. 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     Past Medical History:  Diagnosis Date  . Ascites 2013  . Campylobacter diarrhea 04/2016   bloody diarrhea.   . Cirrhosis ([place]) 2013   Due to ETOH and ? obesity related fatty liver disease. Hepatitis B and C serologies negative 06/2016.     Marland Kitchen Hypomagnesemia 01/31/2018  . Splenomegaly 2013  . Thrombocytopenia ([place]) 2013    Past Surgical History:  Procedure Laterality Date  . ESOPHAGOGASTRODUODENOSCOPY  08/2015   [place], Dr. Britta Mccreedy   . PARACENTESIS  2013   In [place] at [facility] associated hospital.     Prior to Admission medications   Medication Sig Start Date End Date Taking? Authorizing Provider  folic acid (FOLVITE) 1 MG tablet Take 1 tablet (1 mg total) by mouth daily. 05/10/16  Yes Barton Dubois, MD  Multiple Vitamin (MULTIVITAMIN WITH MINERALS) TABS tablet Take 1 tablet by mouth daily.   Yes [provider]  omeprazole (PRILOSEC) 20 MG capsule Take 20 mg by mouth 2 (two) times daily before  a meal.   Yes [provider]  propranolol (INDERAL) 20 MG tablet Take 1 tablet (20 mg total) by mouth 2 (two) times daily. 05/10/16  Yes Barton Dubois, MD  thiamine 100 MG tablet Take 1 tablet (100 mg total) by mouth daily. 05/10/16  Yes Barton Dubois, MD    Current Facility-Administered Medications  Medication Dose Route Frequency Provider Last Rate Last Dose  . 0.9 %  sodium chloride infusion  250 mL Intravenous PRN Johnson, Clanford L, MD      .  azithromycin (ZITHROMAX) 500 mg in sodium chloride 0.9 % 250 mL IVPB  500 mg Intravenous Q24H Wynetta Emery, Clanford L, MD   Stopped at 01/31/18 1610  . cefTRIAXone (ROCEPHIN) 1 g in sodium chloride 0.9 % 100 mL IVPB  1 g Intravenous Q24H Murlean Iba, MD   Stopped at 01/31/18 1539  . folic acid (FOLVITE) tablet 1 mg  1 mg Oral Daily Johnson, Clanford L, MD   1 mg at 02/01/18 3532  . furosemide (LASIX) injection 60 mg  60 mg Intravenous Q12H Johnson, Clanford L, MD      . LORazepam (ATIVAN) tablet 1 mg  1 mg Oral Q6H PRN Murlean Iba, MD       Or  . LORazepam (ATIVAN) injection 1 mg  1 mg Intravenous Q6H PRN Johnson, Clanford L, MD      . magnesium sulfate IVPB 4 g 100 mL  4 g Intravenous Once Wynetta Emery, Clanford L, MD 50 mL/hr at 02/01/18 0921 4 g at 02/01/18 9924  . multivitamin with minerals tablet 1 tablet  1 tablet Oral Daily Wynetta Emery, Clanford L, MD   1 tablet at 02/01/18 0921  . pantoprazole (PROTONIX) EC tablet 40 mg  40 mg Oral BID Wynetta Emery, Clanford L, MD   40 mg at 02/01/18 0921  . potassium chloride (K-DUR,KLOR-CON) CR tablet 50 mEq  50 mEq Oral BID Wynetta Emery, Clanford L, MD   50 mEq at 02/01/18 0921  . propranolol (INDERAL) tablet 20 mg  20 mg Oral BID Wynetta Emery, Clanford L, MD   20 mg at 02/01/18 0921  . sodium chloride flush (NS) 0.9 % injection 3 mL  3 mL Intravenous Q12H Johnson, Clanford L, MD   3 mL at 02/01/18 0929  . sodium chloride flush (NS) 0.9 % injection 3 mL  3 mL Intravenous PRN Johnson, Clanford L, MD      . thiamine (VITAMIN B-1) tablet 100 mg  100 mg Oral Daily Johnson, Clanford L, MD   100 mg at 02/01/18 0921    Allergies as of 01/31/2018  . (No Known Allergies)    Family History  Problem Relation Age of Onset  . Diabetes Father   . Hypertension Father   . Colon polyps Father 31  . Diabetes Other   . Cancer Neg Hx   . Colon cancer Neg Hx   . Liver disease Neg Hx     Social History   Socioeconomic History  . Marital status: Single    Spouse name:  Not on file  . Number of children: Not on file  . Years of education: 2 to 3 yrs of colleg  . Highest education level: Not on file  Occupational History  . 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 Financial resource strain: Not on file  . Food insecurity:    Worry: Not on file    Inability: Not on file  . Transportation  needs:    Medical: Not on file    Non-medical: Not on file  Tobacco Use  . Smoking status: Never Smoker  . Smokeless tobacco: Never Used  Substance and Sexual Activity  . Alcohol use: Yes    Alcohol/week: 12.0 oz    Types: 20 Cans of beer per week    Comment: history of ETOH abuse, sober for one month  . Drug use: No  . Sexual activity: Not on file  Lifestyle  . Physical activity:    Days per week: Not on file    Minutes per session: Not on file  . Stress: Not on file  Relationships  . Social connections:    Talks on phone: Not on file    Gets together: Not on file    Attends religious service: Not on file    Active member of club or organization: Not on file    Attends meetings of clubs or organizations: Not on file    Relationship status: Not on file  . Intimate partner violence:    Fear of current or ex partner: Not on file    Emotionally abused: Not on file    Physically abused: Not on file    Forced sexual activity: Not on file  Other Topics Concern  . Not on file  Social History Narrative   Had nearly 1 year of college credit gained in high school AP classes.  Stayed in college for about 2 years, has about 3 yrs of college credits.     Review of Systems: Gen: see HPI  CV: Denies chest pain, heart palpitations, syncope, edema  Resp: +cough  GI: see HPI  GU : Denies urinary burning, urinary frequency, urinary incontinence.  MS: see HPI  Derm: Denies rash, itching, dry skin Psych: Denies depression, anxiety,confusion, or memory loss Heme: Denies bruising, bleeding, and enlarged lymph  nodes.  Physical Exam: Vital signs in last 24 hours: Temp:  [97.8 F (36.6 C)-98.8 F (37.1 C)] 98.3 F (36.8 C) (05/07 0509) Pulse Rate:  [72-91] 80 (05/07 0940) Resp:  [13-18] 18 (05/07 0940) BP: (115-137)/(64-84) 118/64 (05/07 0940) SpO2:  [91 %-100 %] 97 % (05/07 0940) Weight:  [283 lb 4.7 oz (128.5 kg)-287 lb 5 oz (130.3 kg)] 283 lb 4.7 oz (128.5 kg) (05/07 0600) Last BM Date: 01/31/18 General:   Alert,  Chronically ill-appearing, sallow-appearance, pleasant and cooperative.  Head:  Normocephalic and atraumatic. Eyes:  Sclera clear,Conjunctiva pink. Ears:  Normal auditory acuity. Nose:  No deformity, discharge,  or lesions. Mouth:  No deformity or lesions Lungs:  Clear throughout to auscultation.   Diminished bases  Heart:  S1 S2 present without murmurs  Abdomen:  +BS, obese, large AP diameter, anasarca, umbilical hernia with thin skin overlying, no TTP diffusely Rectal:  Deferred  Msk:  Thin upper extremities, loss of muscle mass  Extremities:  With 2-3+ pitting lower extremity edema, pitting edema 1-2+ to thigh  Neurologic:  Alert and  oriented x4;  grossly normal neurologically. Negative asterixis  Psych:  Alert and cooperative. Normal mood and affect.  Intake/Output from previous day: 05/06 0701 - 05/07 0700 In: 792.9 [P.O.:120; Blood:272.9; IV Piggyback:400] Out: 700 [Urine:700] Intake/Output this shift: No intake/output data recorded.  Lab Results: Recent Labs    01/31/18 1140 02/01/18 0454  WBC 2.8* 3.6*  HGB 6.2* 7.0*  HCT 22.6* 23.8*  PLT 85* 79*   BMET Recent Labs    01/31/18 1140 02/01/18 0454  NA 135 136  K 3.3* 3.5  CL 101 103  CO2 26 26  GLUCOSE 95 82  BUN 5* 5*  CREATININE 0.46* 0.48*  CALCIUM 8.2* 8.1*   LFT Recent Labs    01/31/18 1140 02/01/18 0454  PROT 7.0 6.4*  ALBUMIN 2.7* 2.5*  AST 74* 66*  ALT 22 19  ALKPHOS 150* 129*  BILITOT 3.3* 3.3*   PT/INR Recent Labs    01/31/18 1224 02/01/18 0454  LABPROT 17.8* 18.4*   INR 1.48 1.54    Studies/Results: Dg Chest 2 View  Result Date: 01/31/2018 CLINICAL DATA:  Abdominal swelling EXAM: CHEST - 2 VIEW COMPARISON:  None. FINDINGS: Cardiac shadow is within normal limits. The lungs are well aerated bilaterally. Left lower lobe infiltrate with associated effusion is seen. IMPRESSION: Left lower lobe pneumonia Electronically Signed   By: Inez Catalina M.D.   On: 01/31/2018 13:45   US Abdomen Complete  Result Date: 01/31/2018 CLINICAL DATA:  37 year old male with abdominal swelling, history of alcoholic cirrhosis and ascites EXAM: ABDOMEN ULTRASOUND COMPLETE COMPARISON:  None. FINDINGS: Gallbladder: No gallstones or wall thickening visualized. No sonographic Murphy sign noted by sonographer. Common bile duct: Diameter: Normal at 3 mm Liver: The hepatic contour appears mildly nodular. There is relatively increased echogenicity and coarsening of the echotexture. No discrete mass or lesion identified. Portal vein is patent on color Doppler imaging with normal direction of blood flow towards the liver. IVC: No abnormality visualized. Pancreas: Visualized portion unremarkable. Spleen: Marked splenomegaly. The spleen measures up to 18.7 cm with a volume of 1302 cubic cm (top-normal is 411 cubic cm). Right Kidney: Length: 12.1 cm. Echogenicity within normal limits. No mass or hydronephrosis visualized. Left Kidney: Length: 12.0 cm. Echogenicity within normal limits. No mass or hydronephrosis visualized. Abdominal aorta: No aneurysm visualized. Other findings: Trace ascites. IMPRESSION: 1. Hepatic cirrhosis with marked splenomegaly suggesting underlying portal hypertension. 2. Only trace perihepatic ascites, insufficient for paracentesis. 3. No discrete hepatic lesion identified. Electronically Signed   By: Jacqulynn Cadet M.D.   On: 01/31/2018 13:42    Impression: 37 year old male with history of chronic alcohol abuse now sober for one month, history of alcoholic hepatitis in  6063/0160, presenting with decompensated cirrhosis with anasarca in setting of pneumonia. Discriminant function ranging from 18-35 (depending on PT control). MELD Na 18, Child Pugh C. He has previously been evaluated at [facility] in [facility], Dr. Britta Mccreedy several years ago with screening EGD for varices (?2016/2017), and most recently by GI in [place] but without any adjustments to medical therapy. Notable worsening of abdominal distension and lower extremity over the past month prompted presentation to ED.  Cirrhosis: with marked anasarca. No clinical signs of encephalopathy. Ammonia elevated but clinically patient is without hepatic encephalopathy. Small amount of ascites per notes, even though [place] originally stated trace ascites. He has had diagnostic paracentesis today with full fluid analysis. Low likelihood of SBP but will follow-up on fluid analysis. With MELD Na 18, Child Pugh C, overall poor prognosis especially if continues to drink. Applauded on ETOH cessation. Not a liver transplant candidate at this point due to recent ETOH use but can arrange as outpatient. Currently without insurance but in process of applying for Medicaid. DF 18-35 with PT control but would hold on prednisolone due to active treatment of pneumonia. Would also recommend ordering additional full serologies, although liver disease is most likely fatty liver/ETOH related. Will need EGD as outpatient for reassessment as he has had evidence of decompensation.   Hypoalbuminemia: in setting of advanced liver disease.  Anasarca: on IV lasix. Continue to diurese. Change diet to 2 gram sodium diet. May benefit from rounds of IV albumin followed by Lasix.   Thrombocytopenia: in setting of advanced liver disease.   Anemia: unknown baseline but was in the 9/10 range in 2017. Admitting Hgb 6.2, s/p 2 units PRBCs now 7. No overt GI bleeding. Unknown hemoccult status but this has been ordered. Monitor for overt GI bleeding. Likely  multifactorial in setting of chronic disease. Anemia panel pending.  I had a frank discussion with patient regarding poor prognosis including death if continues to drink. He is aware of this.     Plan: Monitor for signs of encephalopathy, further decompensation Continue IV diuresis May benefit from rounds of IV albumin followed by lasix 2 gram sodium diet Will follow up on pending fluid analysis from paracentesis Monitor for signs of overt GI bleeding, obtain hemoccult status Will need outpatient EGD for variceal surveillance due to evidence of decompensation Further serologies ordered Follow-up on pending anemia panel ETOH cessation Outpatient liver transplantation referral: currently not a candidate do to recent ETOH use Pneumonia treatment per attending Will continue to follow with you: patient also desires to continue GI care here in [place], PhD, ANP-BC [facility]     LOS: 1 day    02/01/2018, 10:23 AM

## 2018-02-01 NOTE — Procedures (Signed)
PreOperative Dx: Alcoholic cirrhosis, ascites Postoperative Dx: Alcoholic cirrhosis, ascites Procedure:   US guided paracentesis Radiologist:  Trevian Hayashida Anesthesia:  10 ml of1% lidocaine Specimen:  5.18 L of yellow ascitic fluid EBL:   < 1 ml Complications: None  

## 2018-02-01 NOTE — Progress Notes (Signed)
Paracentesis complete no signs of distress.  

## 2018-02-01 NOTE — Consult Note (Signed)
[facility] Consultation Oncology  Name: Grant [name]      MRN: [id number]    Location: A302/A302-01  Date: 02/01/2018 Time:4:46 PM   REFERRING PHYSICIAN: Dr. Wynetta Emery  REASON FOR CONSULT: Anemia and thrombocytopenia  DIAGNOSIS: Severe microcytic anemia from iron deficiency, likely from blood loss  HISTORY OF PRESENT ILLNESS: This is a 37 year old male with history of cirrhosis and splenomegaly, admitted with tiredness and was found to have a hemoglobin of 6.2.  He was also diagnosed with pneumonia and is receiving antibiotics.  Peripheral blood smear showed schistocytes.  He received 2 units of blood transfusion.  He had a abdominal paracentesis done and about 5 L of fluid was removed.  Denies any history of bleeding per rectum or melena at home.  Denies any hematemesis.  No fevers, night sweats or weight loss reported.  PAST MEDICAL HISTORY:   Past Medical History:  Diagnosis Date  . Ascites 2013  . Campylobacter diarrhea 04/2016   bloody diarrhea.   . Cirrhosis ([place]) 2013   Due to ETOH and ? obesity related fatty liver disease. Hepatitis B and C serologies negative 06/2016.     Marland Kitchen Hypomagnesemia 01/31/2018  . Splenomegaly 2013  . Thrombocytopenia ([place]) 2013    ALLERGIES: No Known Allergies    MEDICATIONS: I have reviewed the patient's current medications.     PAST SURGICAL HISTORY Past Surgical History:  Procedure Laterality Date  . ESOPHAGOGASTRODUODENOSCOPY  08/2015   [place], Dr. Britta Mccreedy   . PARACENTESIS  2013   In [place] at [facility] associated hospital.     FAMILY HISTORY: Family History  Problem Relation Age of Onset  . Diabetes Father   . Hypertension Father   . Colon polyps Father 40  . Diabetes Other   . Cancer Neg Hx   . Colon cancer Neg Hx   . Liver disease Neg Hx     SOCIAL HISTORY:  reports that he has never smoked. He has never used smokeless tobacco. He reports that he drinks about 12.0 oz of alcohol per week. He reports that he does not use  drugs.  PERFORMANCE STATUS: The patient's performance status is 1 - Symptomatic but completely ambulatory  PHYSICAL EXAM: Most Recent Vital Signs: Blood pressure 117/64, pulse 74, temperature 97.6 F (36.4 C), temperature source Oral, resp. rate 20, height 5' 10" (1.778 m), weight 283 lb 4.7 oz (128.5 kg), SpO2 100 %. Awake alert oriented x3. Abdomen is distended.  There is 1+ edema bilaterally.  LABORATORY DATA:  Results for orders placed or performed during the hospital encounter of 01/31/18 (from the past 48 hour(s))  Urinalysis, Routine w reflex microscopic     Status: Abnormal   Collection Time: 01/31/18 10:54 AM  Result Value Ref Range   Color, Urine AMBER (A) YELLOW    Comment: BIOCHEMICALS MAY BE AFFECTED BY COLOR   APPearance CLEAR CLEAR   Specific Gravity, Urine 1.019 1.005 - 1.030   pH 6.0 5.0 - 8.0   Glucose, UA NEGATIVE NEGATIVE mg/dL   Hgb urine dipstick MODERATE (A) NEGATIVE   Bilirubin Urine MODERATE (A) NEGATIVE   Ketones, ur NEGATIVE NEGATIVE mg/dL   Protein, ur NEGATIVE NEGATIVE mg/dL   Nitrite NEGATIVE NEGATIVE   Leukocytes, UA NEGATIVE NEGATIVE   RBC / HPF 21-50 0 - 5 RBC/hpf   WBC, UA 0-5 0 - 5 WBC/hpf   Bacteria, UA NONE SEEN NONE SEEN   Squamous Epithelial / LPF 0-5 0 - 5    Comment: Please note change in  reference range.   Mucus PRESENT    Uric Acid Crys, UA PRESENT     Comment: Performed at [facility], [address], [place], [place] [address]  Lipase, blood     Status: None   Collection Time: 01/31/18 11:40 AM  Result Value Ref Range   Lipase 38 11 - 51 U/L    Comment: Performed at [facility], [address], [place], [place] [address]  Comprehensive metabolic panel     Status: Abnormal   Collection Time: 01/31/18 11:40 AM  Result Value Ref Range   Sodium 135 135 - 145 mmol/L   Potassium 3.3 (L) 3.5 - 5.1 mmol/L   Chloride 101 101 - 111 mmol/L   CO2 26 22 - 32 mmol/L   Glucose, Bld 95 65 - 99 mg/dL   BUN 5 (L) 6 - 20 mg/dL   Creatinine,  Ser 0.46 (L) 0.61 - 1.24 mg/dL   Calcium 8.2 (L) 8.9 - 10.3 mg/dL   Total Protein 7.0 6.5 - 8.1 g/dL   Albumin 2.7 (L) 3.5 - 5.0 g/dL   AST 74 (H) 15 - 41 U/L   ALT 22 17 - 63 U/L   Alkaline Phosphatase 150 (H) 38 - 126 U/L   Total Bilirubin 3.3 (H) 0.3 - 1.2 mg/dL   GFR calc non Af Amer >60 >60 mL/min   GFR calc Af Amer >60 >60 mL/min    Comment: (NOTE) The eGFR has been calculated using the CKD EPI equation. This calculation has not been validated in all clinical situations. eGFR's persistently <60 mL/min signify possible Chronic Kidney Disease.    Anion gap 8 5 - 15    Comment: Performed at [facility], [address], [place], [place] [address]  CBC     Status: Abnormal   Collection Time: 01/31/18 11:40 AM  Result Value Ref Range   WBC 2.8 (L) 4.0 - 10.5 K/uL   RBC 3.07 (L) 4.22 - 5.81 MIL/uL   Hemoglobin 6.2 (LL) 13.0 - 17.0 g/dL    Comment: CRITICAL RESULT CALLED TO, READ BACK BY AND VERIFIED WITH: HYATT,K_0  BY MATTHEWS, B 5.6.19    HCT 22.6 (L) 39.0 - 52.0 %   MCV 73.6 (L) 78.0 - 100.0 fL   MCH 20.2 (L) 26.0 - 34.0 pg   MCHC 27.4 (L) 30.0 - 36.0 g/dL   RDW 21.7 (H) 11.5 - 15.5 %   Platelets 85 (L) 150 - 400 K/uL    Comment: SPECIMEN CHECKED FOR CLOTS PLATELET COUNT CONFIRMED BY SMEAR Performed at [facility], [address], [place], [place] [address]   Protime-INR     Status: Abnormal   Collection Time: 01/31/18 12:24 PM  Result Value Ref Range   Prothrombin Time 17.8 (H) 11.4 - 15.2 seconds   INR 1.48     Comment: Performed at [facility], [address] [place], [place] [address]  Type and screen     Status: None   Collection Time: 01/31/18 12:34 PM  Result Value Ref Range   ABO/RH(D) A POS    Antibody Screen NEG    Sample Expiration 02/03/2018    Unit Number [id number]    Blood Component Type RED CELLS,LR    Unit division 00    Status of Unit ISSUED,FINAL    Transfusion Status OK TO TRANSFUSE    Crossmatch Result      Compatible Performed at  [facility], [address] [place], [place] [address]    Unit Number [id number]  Blood Component Type RED CELLS,LR    Unit division 00    Status of Unit ISSUED,FINAL    Transfusion Status OK TO TRANSFUSE    Crossmatch Result Compatible   Ethanol     Status: None   Collection Time: 01/31/18 12:34 PM  Result Value Ref Range   Alcohol, Ethyl (B) <10 <10 mg/dL    Comment:        LOWEST DETECTABLE LIMIT FOR SERUM ALCOHOL IS 10 mg/dL FOR MEDICAL PURPOSES ONLY Performed at [facility], [address], [place], [place] [address]   Differential     Status: Abnormal   Collection Time: 01/31/18 12:35 PM  Result Value Ref Range   Neutro Abs 1.1 (L) 1.7 - 7.7 K/uL   Lymphs Abs 0.8 0.7 - 4.0 K/uL   Monocytes Absolute 0.6 0.1 - 1.0 K/uL   Eosinophils Absolute 0.2 0.0 - 0.7 K/uL   Basophils Absolute 0.1 0.0 - 0.1 K/uL   Neutrophils Relative % 39 %   Lymphocytes Relative 30 %   Monocytes Relative 22 %   Eosinophils Relative 7 %   Basophils Relative 2 %   Band Neutrophils 0 %   Metamyelocytes Relative 0 %   Myelocytes 0 %   Promyelocytes Relative 0 %   Blasts 0 %   nRBC 0 0 /100 WBC   Other 0 %   RBC Morphology Schistocytes present     Comment: TARGET CELLS ELLIPTOCYTES ANISOCYTES Performed at [facility], [address] [place], [place] [address]   Magnesium     Status: Abnormal   Collection Time: 01/31/18 12:35 PM  Result Value Ref Range   Magnesium 1.3 (L) 1.7 - 2.4 mg/dL    Comment: Performed at [facility], [address] [place], [place] [address]  ABO/Rh     Status: None   Collection Time: 01/31/18 12:37 PM  Result Value Ref Range   ABO/RH(D)      A POS Performed at [facility], [address] [place], [place] [address]   Direct antiglobulin test (not at [facility])     Status: None   Collection Time: 01/31/18 12:37 PM  Result Value Ref Range   DAT, complement      NEG Performed at [facility], [place] [address], [place], [place] [address]    DAT, IgG       NEG Performed at [facility], [address], [place], [place] [address]   Prepare RBC     Status: None   Collection Time: 01/31/18 12:45 PM  Result Value Ref Range   Order Confirmation      ORDER PROCESSED BY BLOOD BANK Performed at [facility], [address] [place], [place] [address]   Culture, blood (Routine X 2) w Reflex to ID Panel     Status: None (Preliminary result)   Collection Time: 01/31/18  3:06 PM  Result Value Ref Range   Specimen Description LEFT ANTECUBITAL    Special Requests      BOTTLES DRAWN AEROBIC AND ANAEROBIC Blood Culture adequate volume   Culture      NO GROWTH < 24 HOURS Performed at [facility], [address] [place] [address], [place], [place] [address]    Report Status PENDING   Culture, blood (Routine X 2) w Reflex to ID Panel     Status: None (Preliminary result)   Collection Time: 01/31/18  3:06 PM  Result Value Ref Range   Specimen Description RIGHT ANTECUBITAL    Special Requests  BOTTLES DRAWN AEROBIC AND ANAEROBIC Blood Culture adequate volume   Culture      NO GROWTH < 24 HOURS Performed at [facility], [address], [place], [place] [address]    Report Status PENDING   Strep pneumoniae urinary antigen     Status: None   Collection Time: 01/31/18  4:04 PM  Result Value Ref Range   Strep Pneumo Urinary Antigen NEGATIVE NEGATIVE    Comment:        Infection due to S. pneumoniae cannot be absolutely ruled out since the antigen present may be below the detection limit of the test. PERFORMED AT [facility] Performed at [facility], [place] [address] [place], [place] [address]   TSH     Status: None   Collection Time: 01/31/18  4:04 PM  Result Value Ref Range   TSH 2.121 0.350 - 4.500 uIU/mL    Comment: Performed by a 3rd Generation assay with a functional sensitivity of <=0.01 uIU/mL. Performed at [facility], [address], [place], [place] [address]   Rapid urine drug screen (hospital performed)     Status: None    Collection Time: 01/31/18  4:04 PM  Result Value Ref Range   Opiates NONE DETECTED NONE DETECTED   Cocaine NONE DETECTED NONE DETECTED   Benzodiazepines NONE DETECTED NONE DETECTED   Amphetamines NONE DETECTED NONE DETECTED   Tetrahydrocannabinol NONE DETECTED NONE DETECTED   Barbiturates NONE DETECTED NONE DETECTED    Comment: (NOTE) DRUG SCREEN FOR MEDICAL PURPOSES ONLY.  IF CONFIRMATION IS NEEDED FOR ANY PURPOSE, NOTIFY LAB WITHIN 5 DAYS. LOWEST DETECTABLE LIMITS FOR URINE DRUG SCREEN Drug Class                     Cutoff (ng/mL) Amphetamine and metabolites    1000 Barbiturate and metabolites    200 Benzodiazepine                 144 Tricyclics and metabolites     300 Opiates and metabolites        300 Cocaine and metabolites        300 THC                            50 Performed at [facility], [address], [place], [place] [address]   Comprehensive metabolic panel     Status: Abnormal   Collection Time: 02/01/18  4:54 AM  Result Value Ref Range   Sodium 136 135 - 145 mmol/L   Potassium 3.5 3.5 - 5.1 mmol/L   Chloride 103 101 - 111 mmol/L   CO2 26 22 - 32 mmol/L   Glucose, Bld 82 65 - 99 mg/dL   BUN 5 (L) 6 - 20 mg/dL   Creatinine, Ser 0.48 (L) 0.61 - 1.24 mg/dL   Calcium 8.1 (L) 8.9 - 10.3 mg/dL   Total Protein 6.4 (L) 6.5 - 8.1 g/dL   Albumin 2.5 (L) 3.5 - 5.0 g/dL   AST 66 (H) 15 - 41 U/L   ALT 19 17 - 63 U/L   Alkaline Phosphatase 129 (H) 38 - 126 U/L   Total Bilirubin 3.3 (H) 0.3 - 1.2 mg/dL   GFR calc non Af Amer >60 >60 mL/min   GFR calc Af Amer >60 >60 mL/min    Comment: (NOTE) The eGFR has been calculated using the CKD EPI equation. This calculation has not been validated in all  clinical situations. eGFR's persistently <60 mL/min signify possible Chronic Kidney Disease.    Anion gap 7 5 - 15    Comment: Performed at [facility], [address] [place], [place] [address]  CBC WITH DIFFERENTIAL     Status: Abnormal   Collection Time: 02/01/18  4:54  AM  Result Value Ref Range   WBC 3.6 (L) 4.0 - 10.5 K/uL   RBC 3.17 (L) 4.22 - 5.81 MIL/uL   Hemoglobin 7.0 (L) 13.0 - 17.0 g/dL   HCT 23.8 (L) 39.0 - 52.0 %   MCV 75.1 (L) 78.0 - 100.0 fL   MCH 22.1 (L) 26.0 - 34.0 pg   MCHC 29.4 (L) 30.0 - 36.0 g/dL   RDW 21.0 (H) 11.5 - 15.5 %   Platelets 79 (L) 150 - 400 K/uL    Comment: SPECIMEN CHECKED FOR CLOTS PLATELET COUNT CONFIRMED BY SMEAR    Neutrophils Relative % 37 %   Neutro Abs 1.4 (L) 1.7 - 7.7 K/uL   Lymphocytes Relative 30 %   Lymphs Abs 1.1 0.7 - 4.0 K/uL   Monocytes Relative 24 %   Monocytes Absolute 0.8 0.1 - 1.0 K/uL   Eosinophils Relative 8 %   Eosinophils Absolute 0.3 0.0 - 0.7 K/uL   Basophils Relative 1 %   Basophils Absolute 0.0 0.0 - 0.1 K/uL    Comment: Performed at [facility], [address], [place], [place] [address]  Magnesium     Status: Abnormal   Collection Time: 02/01/18  4:54 AM  Result Value Ref Range   Magnesium 1.6 (L) 1.7 - 2.4 mg/dL    Comment: Performed at [facility], [address] [place], [place] [address]  Ammonia     Status: Abnormal   Collection Time: 02/01/18  4:54 AM  Result Value Ref Range   Ammonia 89 (H) 9 - 35 umol/L    Comment: Performed at [facility], [address] [place], [place] [address]  Lipid panel     Status: Abnormal   Collection Time: 02/01/18  4:54 AM  Result Value Ref Range   Cholesterol 131 0 - 200 mg/dL   Triglycerides 78 <150 mg/dL   HDL 17 (L) >40 mg/dL   Total CHOL/HDL Ratio 7.7 RATIO   VLDL 16 0 - 40 mg/dL   LDL Cholesterol 98 0 - 99 mg/dL    Comment:        Total Cholesterol/HDL:CHD Risk Coronary Heart Disease Risk Table                     Men   Women  1/2 Average Risk   3.4   3.3  Average Risk       5.0   4.4  2 X Average Risk   9.6   7.1  3 X Average Risk  23.4   11.0        Use the calculated Patient Ratio above and the CHD Risk Table to determine the patient's CHD Risk.        ATP III CLASSIFICATION (LDL):  <100     mg/dL   Optimal   100-129  mg/dL   Near or Above                    Optimal  130-159  mg/dL   Borderline  160-189  mg/dL   High  >190     mg/dL   Very High Performed at [facility], [address], [place], [place]  [place]     Status: Abnormal   Collection Time: 02/01/18  4:54 AM  Result Value Ref Range   Prothrombin Time 18.4 (H) 11.4 - 15.2 seconds   INR 1.54     Comment: Performed at [facility], [address], [place], [place] [address]  Vitamin B12     Status: Abnormal   Collection Time: 02/01/18  4:54 AM  Result Value Ref Range   Vitamin B-12 1,282 (H) 180 - 914 pg/mL    Comment: (NOTE) This assay is not validated for testing neonatal or myeloproliferative syndrome specimens for Vitamin B12 levels. Performed at [facility], [place] [address], [place], [place] [address]   Iron and TIBC     Status: Abnormal   Collection Time: 02/01/18  4:54 AM  Result Value Ref Range   Iron 24 (L) 45 - 182 ug/dL   TIBC 335 250 - 450 ug/dL   Saturation Ratios 7 (L) 17.9 - 39.5 %   UIBC 311 ug/dL    Comment: Performed at [facility], [place] [address] [place], [place] [address]  Ferritin     Status: Abnormal   Collection Time: 02/01/18  4:54 AM  Result Value Ref Range   Ferritin 11 (L) 24 - 336 ng/mL    Comment: Performed at [facility], [place] [address] [place], [place] [address]  Folate     Status: None   Collection Time: 02/01/18  4:54 AM  Result Value Ref Range   Folate 29.0 >5.9 ng/mL    Comment: Performed at [place] [address] [place], [place] [address]  Lactate dehydrogenase     Status: None   Collection Time: 02/01/18  4:54 AM  Result Value Ref Range   LDH 120 98 - 192 U/L    Comment: Performed at [facility], [address], [place], [place] [address]  Reticulocytes     Status: Abnormal   Collection Time: 02/01/18  4:54 AM  Result Value Ref Range   Retic Ct Pct 1.9 0.4 - 3.1 %   RBC. 3.18 (L) 4.22 - 5.81 MIL/uL   Retic Count, Absolute 60.4  19.0 - 186.0 K/uL    Comment: Performed at [facility], [address], [place], [place] [address]  Lactate dehydrogenase (pleural or peritoneal fluid)     Status: Abnormal   Collection Time: 02/01/18  9:42 AM  Result Value Ref Range   LD, Fluid 24 (H) 3 - 23 U/L    Comment: (NOTE) Results should be evaluated in conjunction with serum values    Fluid Type-FLDH ASCITIC     Comment: Performed at [facility], [address] [place], [place] [address] CORRECTED ON 05/07 AT 1000: PREVIOUSLY REPORTED AS PERITONEAL CAVITY   Body fluid cell count with differential     Status: Abnormal   Collection Time: 02/01/18  9:42 AM  Result Value Ref Range   Fluid Type-FCT ASCITIC     Comment: CORRECTED ON 05/07 AT 1000: PREVIOUSLY REPORTED AS PERITONEAL CAVITY   Color, Fluid YELLOW YELLOW   Appearance, Fluid CLEAR CLEAR   WBC, Fluid 74 0 - 1,000 cu mm   Neutrophil Count, Fluid 4 0 - 25 %   Lymphs, Fluid 54 %   Monocyte-Macrophage-Serous Fluid 42 (L) 50 - 90 %   Eos, Fluid 0 %   Other Cells, Fluid MESOTHELIAL CELLS AND REACTIVE MESOTHELIAL CELLS %    Comment: Performed at [facility], [place]  [address], [place], [place] [address]  Albumin, pleural or peritoneal fluid     Status: None   Collection Time: 02/01/18  9:42 AM  Result Value Ref Range   Albumin, Fluid <1.0 g/dL    Comment: (NOTE) No normal range established for this test Results should be evaluated in conjunction with serum values    Fluid Type-FALB ASCITIC     Comment: Performed at [facility], [address], [place], [place] [address] CORRECTED ON 05/07 AT 1000: PREVIOUSLY REPORTED AS PERITONEAL CAVITY   Protein, pleural or peritoneal fluid     Status: None   Collection Time: 02/01/18  9:42 AM  Result Value Ref Range   Total protein, fluid <3.0 g/dL    Comment: (NOTE) No normal range established for this test Results should be evaluated in conjunction with serum values    Fluid Type-FTP ASCITIC     Comment: Performed at [facility], [address], [place], [place] [address] CORRECTED ON 05/07 AT 1000: PREVIOUSLY REPORTED AS PERITONEAL CAVITY   Gram stain     Status: None   Collection Time: 02/01/18  9:42 AM  Result Value Ref Range   Specimen Description ASCITIC    Special Requests NONE    Gram Stain      NO ORGANISMS SEEN WBC PRESENT, PREDOMINANTLY MONONUCLEAR CYTOSPIN SMEAR Performed at [facility], [address] [place], [place] [address]    Report Status 02/01/2018 FINAL       RADIOGRAPHY: Dg Chest 2 View  Result Date: 01/31/2018 CLINICAL DATA:  Abdominal swelling EXAM: CHEST - 2 VIEW COMPARISON:  None. FINDINGS: Cardiac shadow is within normal limits. The lungs are well aerated bilaterally. Left lower lobe infiltrate with associated effusion is seen. IMPRESSION: Left lower lobe pneumonia Electronically Signed   By: Inez Catalina M.D.   On: 01/31/2018 13:45   US Abdomen Complete  Result Date: 01/31/2018 CLINICAL DATA:  37 year old male with abdominal swelling, history of alcoholic cirrhosis and ascites EXAM: ABDOMEN ULTRASOUND COMPLETE COMPARISON:  None. FINDINGS: Gallbladder: No gallstones or wall thickening visualized. No sonographic Murphy sign noted by sonographer. Common bile duct: Diameter: Normal at 3 mm Liver: The hepatic contour appears mildly nodular. There is relatively increased echogenicity and coarsening of the echotexture. No discrete mass or lesion identified. Portal vein is patent on color Doppler imaging with normal direction of blood flow towards the liver. IVC: No abnormality visualized. Pancreas: Visualized portion unremarkable. Spleen: Marked splenomegaly. The spleen measures up to 18.7 cm with a volume of 1302 cubic cm (top-normal is 411 cubic cm). Right Kidney: Length: 12.1 cm. Echogenicity within normal limits. No mass or hydronephrosis visualized. Left Kidney: Length: 12.0 cm. Echogenicity within normal limits. No mass or hydronephrosis visualized. Abdominal aorta: No aneurysm  visualized. Other findings: Trace ascites. IMPRESSION: 1. Hepatic cirrhosis with marked splenomegaly suggesting underlying portal hypertension. 2. Only trace perihepatic ascites, insufficient for paracentesis. 3. No discrete hepatic lesion identified. Electronically Signed   By: Jacqulynn Cadet M.D.   On: 01/31/2018 13:42   US Paracentesis  Result Date: 02/01/2018 INDICATION: Alcoholic cirrhosis, ascites EXAM: ULTRASOUND GUIDED DIAGNOSTIC AND THERAPEUTIC PARACENTESIS MEDICATIONS: None. COMPLICATIONS: None immediate. PROCEDURE: Procedure, benefits, and risks of procedure were discussed with patient. Written informed consent for procedure was obtained. Time out protocol followed. Adequate collection of ascites localized by ultrasound in RIGHT lower quadrant. Skin prepped and draped in usual sterile fashion. Skin and soft tissues anesthetized with 10 mL of 1% lidocaine. 5 French 10 cm length Yueh catheter placed into  peritoneal cavity. 5.18 L of yellow ascitic fluid aspirated by vacuum bottle suction. Procedure tolerated well by patient without immediate complication. FINDINGS: A total of approximately 5.18 L of ascitic fluid was removed. Samples were sent to the laboratory as requested by the clinical team. IMPRESSION: Successful ultrasound-guided paracentesis yielding 5.18 liters of peritoneal fluid. Electronically Signed   By: Lavonia Dana M.D.   On: 02/01/2018 11:06         ASSESSMENT: 1.  Severe microcytic anemia, secondary to iron deficiency, likely from blood loss.  Hemolysis was ruled out with a normal LDH, reticulocyte count, and negative direct Coom[place] test. 2.  Moderate thrombocytopenia from splenomegaly and cirrhosis.  PLAN:  His ferritin and iron panel shows low iron levels.  He has received 2 units of blood transfusion.  I would recommend one infusion of Feraheme in the hospital.  He will follow-up with [place] in the clinic upon discharge for his next infusion in 1 week.  His platelet count is  more or less stable around 80,000.  No platelet transfusion is indicated.  Will need EGD to assess status of varices.  All questions were answered. The patient knows to call the clinic with any problems, questions or concerns. We can certainly see the patient much sooner if necessary.   Derek Jack

## 2018-02-02 DIAGNOSIS — D500 Iron deficiency anemia secondary to blood loss (chronic): Secondary | ICD-10-CM

## 2018-02-02 DIAGNOSIS — D509 Iron deficiency anemia, unspecified: Secondary | ICD-10-CM

## 2018-02-02 LAB — CBC WITH DIFFERENTIAL/PLATELET
BASOS PCT: 2 %
Basophils Absolute: 0.1 10*3/uL (ref 0.0–0.1)
EOS ABS: 0.2 10*3/uL (ref 0.0–0.7)
EOS PCT: 6 %
HCT: 24.6 % — ABNORMAL LOW (ref 39.0–52.0)
Hemoglobin: 7.1 g/dL — ABNORMAL LOW (ref 13.0–17.0)
Lymphocytes Relative: 30 %
Lymphs Abs: 1.1 10*3/uL (ref 0.7–4.0)
MCH: 21.7 pg — ABNORMAL LOW (ref 26.0–34.0)
MCHC: 28.9 g/dL — ABNORMAL LOW (ref 30.0–36.0)
MCV: 75.2 fL — ABNORMAL LOW (ref 78.0–100.0)
MONO ABS: 0.8 10*3/uL (ref 0.1–1.0)
MONOS PCT: 21 %
Neutro Abs: 1.5 10*3/uL — ABNORMAL LOW (ref 1.7–7.7)
Neutrophils Relative %: 41 %
Platelets: 71 10*3/uL — ABNORMAL LOW (ref 150–400)
RBC: 3.27 MIL/uL — ABNORMAL LOW (ref 4.22–5.81)
RDW: 21.4 % — AB (ref 11.5–15.5)
WBC: 3.7 10*3/uL — ABNORMAL LOW (ref 4.0–10.5)

## 2018-02-02 LAB — IGG, IGA, IGM
IGA: 1184 mg/dL — AB (ref 90–386)
IgG (Immunoglobin G), Serum: 2143 mg/dL — ABNORMAL HIGH (ref 700–1600)
IgM (Immunoglobulin M), Srm: 119 mg/dL (ref 20–172)

## 2018-02-02 LAB — ANTI-SMOOTH MUSCLE ANTIBODY, IGG: F-Actin IgG: 29 Units — ABNORMAL HIGH (ref 0–19)

## 2018-02-02 LAB — COMPREHENSIVE METABOLIC PANEL
ALBUMIN: 2.4 g/dL — AB (ref 3.5–5.0)
ALK PHOS: 123 U/L (ref 38–126)
ALT: 21 U/L (ref 17–63)
AST: 67 U/L — ABNORMAL HIGH (ref 15–41)
Anion gap: 7 (ref 5–15)
BUN: 5 mg/dL — ABNORMAL LOW (ref 6–20)
CO2: 25 mmol/L (ref 22–32)
CREATININE: 0.48 mg/dL — AB (ref 0.61–1.24)
Calcium: 8.2 mg/dL — ABNORMAL LOW (ref 8.9–10.3)
Chloride: 103 mmol/L (ref 101–111)
GFR calc non Af Amer: >60 mL/min (ref >60)
GLUCOSE: 85 mg/dL (ref 65–99)
Potassium: 4 mmol/L (ref 3.5–5.1)
Sodium: 135 mmol/L (ref 135–145)
Total Bilirubin: 2.8 mg/dL — ABNORMAL HIGH (ref 0.3–1.2)
Total Protein: 6.3 g/dL — ABNORMAL LOW (ref 6.5–8.1)

## 2018-02-02 LAB — MITOCHONDRIAL ANTIBODIES: Mitochondrial M2 Ab, IgG: <20 Units (ref 0.0–20.0)

## 2018-02-02 LAB — ACID FAST SMEAR (AFB, MYCOBACTERIA): Acid Fast Smear: NEGATIVE

## 2018-02-02 LAB — PROTIME-INR
INR: 1.35
Prothrombin Time: 16.5 seconds — ABNORMAL HIGH (ref 11.4–15.2)

## 2018-02-02 LAB — CERULOPLASMIN: CERULOPLASMIN: 29.1 mg/dL (ref 16.0–31.0)

## 2018-02-02 LAB — ACID FAST SMEAR (AFB)

## 2018-02-02 LAB — HIV ANTIBODY (ROUTINE TESTING W REFLEX): HIV Screen 4th Generation wRfx: NONREACTIVE

## 2018-02-02 LAB — HEPATITIS A ANTIBODY, TOTAL: HEP A TOTAL AB: POSITIVE — AB

## 2018-02-02 LAB — AMMONIA: AMMONIA: 79 umol/L — AB (ref 9–35)

## 2018-02-02 LAB — MAGNESIUM: MAGNESIUM: 1.6 mg/dL — AB (ref 1.7–2.4)

## 2018-02-02 MED ORDER — MAGNESIUM SULFATE 4 GM/100ML IV SOLN
4.0000 g | Freq: Once | INTRAVENOUS | Status: AC
  Administered 2018-02-02: 4 g via INTRAVENOUS
  Filled 2018-02-02: qty 100

## 2018-02-02 NOTE — Progress Notes (Signed)
Subjective:  Slept well. No other complaints.   Objective: Vital signs in last 24 hours: Temp:  [97.6 F (36.4 C)-98.7 F (37.1 C)] 98.4 F (36.9 C) (05/08 0545) Pulse Rate:  [74-80] 77 (05/08 0545) Resp:  [16-20] 16 (05/08 0545) BP: (114-125)/(57-67) 121/67 (05/08 0545) SpO2:  [97 %-100 %] 97 % (05/08 0545) Weight:  [267 lb 6.7 oz (121.3 kg)] 267 lb 6.7 oz (121.3 kg) (05/08 0500) Last BM Date: 02/01/18 General:   Alert,  Chronically ill appearing WM, pleasant and cooperative in NAD Head:  Normocephalic and atraumatic. Eyes:  Sclera clear, no icterus.  Abdomen:  Soft, obese, tense. Umbilical hernia.    Extremities:  Without clubbing, deformity. 2-3+ pitting edema to knees and 1+ to thighs bilaterally. Neurologic:  Alert and  oriented x4;  grossly normal neurologically. Skin:  Intact without significant lesions or rashes. Psych:  Alert and cooperative. Normal mood and affect.  Intake/Output from previous day: 05/07 0701 - 05/08 0700 In: 363 [P.O.:360; I.V.:3] Out: -  Intake/Output this shift: No intake/output data recorded.  Lab Results: CBC Recent Labs    01/31/18 1140 02/01/18 0454 02/02/18 0537  WBC 2.8* 3.6* 3.7*  HGB 6.2* 7.0* 7.1*  HCT 22.6* 23.8* 24.6*  MCV 73.6* 75.1* 75.2*  PLT 85* 79* 71*   BMET Recent Labs    01/31/18 1140 02/01/18 0454 02/02/18 0537  NA 135 136 135  K 3.3* 3.5 4.0  CL 101 103 103  CO2 GLUCOSE 95 82 85  BUN 5* 5* 5*  CREATININE 0.46* 0.48* 0.48*  CALCIUM 8.2* 8.1* 8.2*   LFTs Recent Labs    01/31/18 1140 02/01/18 0454 02/02/18 0537  BILITOT 3.3* 3.3* 2.8*  ALKPHOS 150* 129* 123  AST 74* 66* 67*  ALT PROT 7.0 6.4* 6.3*  ALBUMIN 2.7* 2.5* 2.4*   Recent Labs    01/31/18 1140  LIPASE 38   PT/INR Recent Labs    01/31/18 1224 02/01/18 0454 02/02/18 0537  LABPROT 17.8* 18.4* 16.5*  INR 1.48 1.54 1.35     Lab Results  Component Value Date   IRON 24 (L) 02/01/2018   TIBC 335 02/01/2018    FERRITIN 11 (L) 02/01/2018   Lab Results  Component Value Date   VITAMINB12 1,282 (H) 02/01/2018   Lab Results  Component Value Date   FOLATE 29.0 02/01/2018    Imaging Studies: Dg Chest 2 View  Result Date: 01/31/2018 CLINICAL DATA:  Abdominal swelling EXAM: CHEST - 2 VIEW COMPARISON:  None. FINDINGS: Cardiac shadow is within normal limits. The lungs are well aerated bilaterally. Left lower lobe infiltrate with associated effusion is seen. IMPRESSION: Left lower lobe pneumonia Electronically Signed   By: Alcide Clever M.D.   On: 01/31/2018 13:45   US Abdomen Complete  Result Date: 01/31/2018 CLINICAL DATA:  37 year old male with abdominal swelling, history of alcoholic cirrhosis and ascites EXAM: ABDOMEN ULTRASOUND COMPLETE COMPARISON:  None. FINDINGS: Gallbladder: No gallstones or wall thickening visualized. No sonographic Murphy sign noted by sonographer. Common bile duct: Diameter: Normal at 3 mm Liver: The hepatic contour appears mildly nodular. There is relatively increased echogenicity and coarsening of the echotexture. No discrete mass or lesion identified. Portal vein is patent on color Doppler imaging with normal direction of blood flow towards the liver. IVC: No abnormality visualized. Pancreas: Visualized portion unremarkable. Spleen: Marked splenomegaly. The spleen measures up to 18.7 cm with a volume of 1302 cubic cm (top-normal is 411 cubic cm).  Right Kidney: Length: 12.1 cm. Echogenicity within normal limits. No mass or hydronephrosis visualized. Left Kidney: Length: 12.0 cm. Echogenicity within normal limits. No mass or hydronephrosis visualized. Abdominal aorta: No aneurysm visualized. Other findings: Trace ascites. IMPRESSION: 1. Hepatic cirrhosis with marked splenomegaly suggesting underlying portal hypertension. 2. Only trace perihepatic ascites, insufficient for paracentesis. 3. No discrete hepatic lesion identified. Electronically Signed   By: Malachy Moan M.D.   On:  01/31/2018 13:42   US Paracentesis  Result Date: 02/01/2018 INDICATION: Alcoholic cirrhosis, ascites EXAM: ULTRASOUND GUIDED DIAGNOSTIC AND THERAPEUTIC PARACENTESIS MEDICATIONS: None. COMPLICATIONS: None immediate. PROCEDURE: Procedure, benefits, and risks of procedure were discussed with patient. Written informed consent for procedure was obtained. Time out protocol followed. Adequate collection of ascites localized by ultrasound in RIGHT lower quadrant. Skin prepped and draped in usual sterile fashion. Skin and soft tissues anesthetized with 10 mL of 1% lidocaine. 5 French 10 cm length Yueh catheter placed into peritoneal cavity. 5.18 L of yellow ascitic fluid aspirated by vacuum bottle suction. Procedure tolerated well by patient without immediate complication. FINDINGS: A total of approximately 5.18 L of ascitic fluid was removed. Samples were sent to the laboratory as requested by the clinical team. IMPRESSION: Successful ultrasound-guided paracentesis yielding 5.18 liters of peritoneal fluid. Electronically Signed   By: Ulyses Southward M.D.   On: 02/01/2018 11:06  [2 weeks]   Assessment:  37 year old male with history of chronic alcohol abuse now sober for one month, history of alcoholic hepatitis in 2013/2017, presenting with decompensated cirrhosis with anasarca in setting of pneumonia. Discriminant function ranging from 18-35 (depending on PT control). MELD Na 18, Child Pugh C. He has previously been evaluated at [facility] in 2013, Dr. Teena Dunk several years ago with screening EGD for varices (?2016/2017) and placed on propranolol at that time, and most recently by GI in [place] but without any adjustments to medical therapy. Notable worsening of abdominal distension and lower extremity over the past month prompted presentation to ED.  Cirrhosis: with marked anasarca. No clinical signs of encephalopathy. Paracentesis yesterday (5.18L) with full fluid analysis, initial cell count without concern  for SBP. With MELD Na 18, Child Pugh C, overall poor prognosis especially if continues to drink. LFT/PT have improved this admission. Encouraged ongoing ETOH cessation.  Not a liver transplant candidate at this point due to recent ETOH use but can arrange as outpatient. Currently without insurance but in process of applying for Medicaid.  Will need EGD as outpatient for reassessment as he has had evidence of decompensation.   Hypoalbuminemia: in setting of advanced liver disease.   Anasarca: on IV lasix. Continue to diurese. Change diet to 2 gram sodium diet. May benefit from rounds of IV albumin followed by Lasix with hold off for now as he is down 20 pounds this admission.  Thrombocytopenia: in setting of advanced liver disease.   Anemia: unknown baseline but was in the 9/10 range in 2017. Admitting Hgb 6.2, s/p 2 units PRBCs. No overt GI bleeding. Unknown hemoccult status. Monitor for overt GI bleeding. Likely multifactorial in setting of chronic disease. Anemia panel with evidence of IDA. Has seen hematology and plans for iron infusions.   Plan: 1. F/U pending labs and fluid studies.  2. Continue IV diuresis, aldactone.  3. ETOH cessation.  4. Outpatient liver transplantation referral.  5. For IDA, he will require EGD +/- TCS which can be done as outpatient. Will need to monitor Hgb closely.   Leanna Battles. [facility] [phone]  5/8/20199:13 AM      LOS: 2 days

## 2018-02-02 NOTE — Progress Notes (Signed)
PROGRESS NOTE  Grant [name]  UJW:[id number]  DOB: 02/25/1981  DOA: 01/31/2018 PCP: Patient, No Pcp Per  Brief Admission Hx: Grant [name] is a 37 y.o. male alcoholic with history of cirrhosis secondary to chronic alcoholism and thought to be related to hepatic steatosis reported that he has not had a paracentesis done since about 2013.  He had been doing fairly well and has been fairly stable.  He reports for the past several months he has noticed increasing abdominal distention, malaise and fatigue.   MDM/Assessment & Plan:    1. LLL Pneumonia -this pneumonia is felt to be community-acquired and will be treated with ceftriaxone and azithromycin.  Supportive therapy has been ordered.  Will monitor pulse ox and provide oxygen as needed. Follow cultures. He is clinically improving.  2. Alcoholic cirrhosis with ascites- thought to be secondary to combination of chronic alcoholism.  The patient did have an abdominal ultrasound but there was no mention of steatosis in the liver. GI has been consulted as patient says he has been lost to follow up. He is s/p paracentesis with 5.1L removed.  Cell counts not suggestive of SBP.  He is on diuretics now and weight slowly coming down.   3. Splenomegaly - from chronic liver disease portal hypertension - stable.  4. Chronic alcoholism-the patient says that he completely stopped alcohol 1 month ago and has not restarted. UDS negative. 5. Thrombocytopenia-From chronic liver disease, stable, seen by hematology, no need for platelet transfusion.     6. Esophageal reflux-Protonix has been ordered. 7. Iron deficiency Anemia- Pt was seen by hematologist and no hemolysis suspected given normal LDH, he was ordered to receive iron transfusions and will follow up with hematology outpatient.  GI planning outpatient TCS.     8. Chronic alcohol abuse- Continue multivitamin, folic acid and thiamine. 9. Hypomagnesemia - IV replacement ordered.   10. Hypokalemia - Repleted.    Oral replacement ordered. Magnesium replacement.   DVT Prophylaxis: SCDs Code Status: Full Family Communication: Patient updated at bedside Disposition Plan: The patient will require ongoing inpatient acute medical care.  Subjective: Pt feeling better, still urinating frequently.     Objective: Vitals:   02/01/18 1301 02/01/18 2137 02/02/18 0500 02/02/18 0545  BP: 117/64 (!) 114/57  121/67  Pulse: 74 80  77  Resp: Temp: 97.6 F (36.4 C) 98.7 F (37.1 C)  98.4 F (36.9 C)  TempSrc: Oral Oral  Oral  SpO2: 100% 97%  97%  Weight:   121.3 kg (267 lb 6.7 oz)   Height:        Intake/Output Summary (Last 24 hours) at 02/02/2018 0943 Last data filed at 02/02/2018 0900 Gross per 24 hour  Intake 543 ml  Output -  Net 543 ml   Filed Weights   01/31/18 1601 02/01/18 0600 02/02/18 0500  Weight: 130.2 kg (287 lb) 128.5 kg (283 lb 4.7 oz) 121.3 kg (267 lb 6.7 oz)   REVIEW OF SYSTEMS  As per history otherwise all reviewed and reported negative  Exam:  General exam: chronically ill appearing, no scleral icterus, NAD. Cooperative.  Respiratory system: BBS shallow but clear. No increased work of breathing. Cardiovascular system: S1 & S2 heard.  Bilateral LE edema. Gastrointestinal system: Abdomen is very distended, soft and nontender. Normal bowel sounds heard. Central nervous system: Alert and oriented. No focal neurological deficits. Extremities: 2+ edema bilateral LEs.  Data Reviewed: Basic Metabolic Panel: Recent Labs  Lab 01/31/18 1140 01/31/18 1235 02/01/18  0454 02/02/18 0537  NA 135  --  136 135  K 3.3*  --  3.5 4.0  CL 101  --  103 103  CO2 26  --  26 25  GLUCOSE 95  --  82 85  BUN 5*  --  5* 5*  CREATININE 0.46*  --  0.48* 0.48*  CALCIUM 8.2*  --  8.1* 8.2*  MG  --  1.3* 1.6* 1.6*   Liver Function Tests: Recent Labs  Lab 01/31/18 1140 02/01/18 0454 02/02/18 0537  AST 74* 66* 67*  ALT ALKPHOS 150* 129* 123  BILITOT 3.3* 3.3* 2.8*    PROT 7.0 6.4* 6.3*  ALBUMIN 2.7* 2.5* 2.4*   Recent Labs  Lab 01/31/18 1140  LIPASE 38   Recent Labs  Lab 02/01/18 0454 02/02/18 0537  AMMONIA 89* 79*   CBC: Recent Labs  Lab 01/31/18 1140 01/31/18 1235 02/01/18 0454 02/02/18 0537  WBC 2.8*  --  3.6* 3.7*  NEUTROABS  --  1.1* 1.4* 1.5*  HGB 6.2*  --  7.0* 7.1*  HCT 22.6*  --  23.8* 24.6*  MCV 73.6*  --  75.1* 75.2*  PLT 85*  --  79* 71*   Cardiac Enzymes: No results for input(s): CKTOTAL, CKMB, CKMBINDEX, TROPONINI in the last 168 hours. CBG (last 3)  No results for input(s): GLUCAP in the last 72 hours. Recent Results (from the past 240 hour(s))  Culture, blood (Routine X 2) w Reflex to ID Panel     Status: None (Preliminary result)   Collection Time: 01/31/18  3:06 PM  Result Value Ref Range Status   Specimen Description LEFT ANTECUBITAL  Final   Special Requests   Final    BOTTLES DRAWN AEROBIC AND ANAEROBIC Blood Culture adequate volume   Culture   Final    NO GROWTH 2 DAYS Performed at [facility], [address], [place], [place] [address]    Report Status PENDING  Incomplete  Culture, blood (Routine X 2) w Reflex to ID Panel     Status: None (Preliminary result)   Collection Time: 01/31/18  3:06 PM  Result Value Ref Range Status   Specimen Description RIGHT ANTECUBITAL  Final   Special Requests   Final    BOTTLES DRAWN AEROBIC AND ANAEROBIC Blood Culture adequate volume   Culture   Final    NO GROWTH 2 DAYS Performed at [facility], [address] [place], [place] [address]    Report Status PENDING  Incomplete  Culture, body fluid-bottle     Status: None (Preliminary result)   Collection Time: 02/01/18  9:42 AM  Result Value Ref Range Status   Specimen Description ASCITIC  Final   Special Requests BOTTLES DRAWN AEROBIC AND ANAEROBIC 10 CC EACH  Final   Culture   Final    NO GROWTH < 24 HOURS Performed at [facility], [address], [place], [place] [address]    Report Status PENDING   Incomplete  Gram stain     Status: None   Collection Time: 02/01/18  9:42 AM  Result Value Ref Range Status   Specimen Description ASCITIC  Final   Special Requests NONE  Final   Gram Stain   Final    NO ORGANISMS SEEN WBC PRESENT, PREDOMINANTLY MONONUCLEAR CYTOSPIN SMEAR Performed at [facility], [address], [place], [place] [address]    Report Status 02/01/2018 FINAL  Final     Studies: Dg Chest 2 View  Result  Date: 01/31/2018 CLINICAL DATA:  Abdominal swelling EXAM: CHEST - 2 VIEW COMPARISON:  None. FINDINGS: Cardiac shadow is within normal limits. The lungs are well aerated bilaterally. Left lower lobe infiltrate with associated effusion is seen. IMPRESSION: Left lower lobe pneumonia Electronically Signed   By: Alcide Clever M.D.   On: 01/31/2018 13:45   US Abdomen Complete  Result Date: 01/31/2018 CLINICAL DATA:  37 year old male with abdominal swelling, history of alcoholic cirrhosis and ascites EXAM: ABDOMEN ULTRASOUND COMPLETE COMPARISON:  None. FINDINGS: Gallbladder: No gallstones or wall thickening visualized. No sonographic Murphy sign noted by sonographer. Common bile duct: Diameter: Normal at 3 mm Liver: The hepatic contour appears mildly nodular. There is relatively increased echogenicity and coarsening of the echotexture. No discrete mass or lesion identified. Portal vein is patent on color Doppler imaging with normal direction of blood flow towards the liver. IVC: No abnormality visualized. Pancreas: Visualized portion unremarkable. Spleen: Marked splenomegaly. The spleen measures up to 18.7 cm with a volume of 1302 cubic cm (top-normal is 411 cubic cm). Right Kidney: Length: 12.1 cm. Echogenicity within normal limits. No mass or hydronephrosis visualized. Left Kidney: Length: 12.0 cm. Echogenicity within normal limits. No mass or hydronephrosis visualized. Abdominal aorta: No aneurysm visualized. Other findings: Trace ascites. IMPRESSION: 1. Hepatic cirrhosis with marked  splenomegaly suggesting underlying portal hypertension. 2. Only trace perihepatic ascites, insufficient for paracentesis. 3. No discrete hepatic lesion identified. Electronically Signed   By: Malachy Moan M.D.   On: 01/31/2018 13:42   US Paracentesis  Result Date: 02/01/2018 INDICATION: Alcoholic cirrhosis, ascites EXAM: ULTRASOUND GUIDED DIAGNOSTIC AND THERAPEUTIC PARACENTESIS MEDICATIONS: None. COMPLICATIONS: None immediate. PROCEDURE: Procedure, benefits, and risks of procedure were discussed with patient. Written informed consent for procedure was obtained. Time out protocol followed. Adequate collection of ascites localized by ultrasound in RIGHT lower quadrant. Skin prepped and draped in usual sterile fashion. Skin and soft tissues anesthetized with 10 mL of 1% lidocaine. 5 French 10 cm length Yueh catheter placed into peritoneal cavity. 5.18 L of yellow ascitic fluid aspirated by vacuum bottle suction. Procedure tolerated well by patient without immediate complication. FINDINGS: A total of approximately 5.18 L of ascitic fluid was removed. Samples were sent to the laboratory as requested by the clinical team. IMPRESSION: Successful ultrasound-guided paracentesis yielding 5.18 liters of peritoneal fluid. Electronically Signed   By: Ulyses Southward M.D.   On: 02/01/2018 11:06   Scheduled Meds: . folic acid  1 mg Oral Daily  . furosemide  40 mg Intravenous BID AC  . multivitamin with minerals  1 tablet Oral Daily  . pantoprazole  40 mg Oral BID  . potassium chloride  50 mEq Oral BID  . propranolol  20 mg Oral BID  . sodium chloride flush  3 mL Intravenous Q12H  . spironolactone  100 mg Oral BID AC  . thiamine  100 mg Oral Daily   Continuous Infusions: . sodium chloride    . azithromycin Stopped (02/01/18 1551)  . cefTRIAXone (ROCEPHIN)  IV Stopped (02/01/18 1521)  . magnesium sulfate 1 - 4 g bolus IVPB 4 g (02/02/18 1610)    Principal Problem:   LLL pneumonia (HCC) Active Problems:    Alcoholic cirrhosis of liver with ascites (HCC)   Alcohol abuse   Anemia, unspecified   Thrombocytopenia (HCC)   Splenomegaly   Cirrhosis (HCC)   Hypomagnesemia   Ascites   Esophageal reflux   Hypokalemia  Time spent:   Standley Dakins, MD, FAAFP [facility] Pager [phone]  If 7PM-7AM, please contact night-coverage [email or web address] Password [email or web address] 02/02/2018, 9:43 AM    LOS: 2 days

## 2018-02-03 ENCOUNTER — Inpatient Hospital Stay (HOSPITAL_COMMUNITY): Payer: Self-pay

## 2018-02-03 ENCOUNTER — Telehealth: Payer: Self-pay | Admitting: Gastroenterology

## 2018-02-03 ENCOUNTER — Encounter: Payer: Self-pay | Admitting: Gastroenterology

## 2018-02-03 DIAGNOSIS — J189 Pneumonia, unspecified organism: Secondary | ICD-10-CM

## 2018-02-03 DIAGNOSIS — R140 Abdominal distension (gaseous): Secondary | ICD-10-CM

## 2018-02-03 LAB — CBC WITH DIFFERENTIAL/PLATELET
BASOS ABS: 0.1 10*3/uL (ref 0.0–0.1)
Basophils Relative: 2 %
EOS PCT: 7 %
Eosinophils Absolute: 0.2 10*3/uL (ref 0.0–0.7)
HCT: 24.7 % — ABNORMAL LOW (ref 39.0–52.0)
HEMOGLOBIN: 7.2 g/dL — AB (ref 13.0–17.0)
LYMPHS ABS: 1.1 10*3/uL (ref 0.7–4.0)
LYMPHS PCT: 32 %
MCH: 22.1 pg — ABNORMAL LOW (ref 26.0–34.0)
MCHC: 29.1 g/dL — ABNORMAL LOW (ref 30.0–36.0)
MCV: 75.8 fL — AB (ref 78.0–100.0)
MONO ABS: 0.8 10*3/uL (ref 0.1–1.0)
Monocytes Relative: 24 %
NEUTROS ABS: 1.2 10*3/uL — AB (ref 1.7–7.7)
Neutrophils Relative %: 35 %
PLATELETS: 71 10*3/uL — AB (ref 150–400)
RBC: 3.26 MIL/uL — ABNORMAL LOW (ref 4.22–5.81)
RDW: 21.8 % — ABNORMAL HIGH (ref 11.5–15.5)
WBC: 3.3 10*3/uL — ABNORMAL LOW (ref 4.0–10.5)

## 2018-02-03 LAB — COMPREHENSIVE METABOLIC PANEL
ALBUMIN: 2.4 g/dL — AB (ref 3.5–5.0)
ALT: 22 U/L (ref 17–63)
ANION GAP: 7 (ref 5–15)
AST: 68 U/L — AB (ref 15–41)
Alkaline Phosphatase: 111 U/L (ref 38–126)
BUN: 5 mg/dL — AB (ref 6–20)
CHLORIDE: 101 mmol/L (ref 101–111)
CO2: 27 mmol/L (ref 22–32)
Calcium: 8.4 mg/dL — ABNORMAL LOW (ref 8.9–10.3)
Creatinine, Ser: 0.5 mg/dL — ABNORMAL LOW (ref 0.61–1.24)
GFR calc Af Amer: >60 mL/min (ref >60)
GFR calc non Af Amer: >60 mL/min (ref >60)
GLUCOSE: 104 mg/dL — AB (ref 65–99)
POTASSIUM: 3.8 mmol/L (ref 3.5–5.1)
SODIUM: 135 mmol/L (ref 135–145)
TOTAL PROTEIN: 6.3 g/dL — AB (ref 6.5–8.1)
Total Bilirubin: 2.4 mg/dL — ABNORMAL HIGH (ref 0.3–1.2)

## 2018-02-03 LAB — TOTAL BILIRUBIN, BODY FLUID

## 2018-02-03 LAB — ANTINUCLEAR ANTIBODIES, IFA: ANTINUCLEAR ANTIBODIES, IFA: NEGATIVE

## 2018-02-03 LAB — MAGNESIUM: MAGNESIUM: 1.5 mg/dL — AB (ref 1.7–2.4)

## 2018-02-03 LAB — AMMONIA: Ammonia: 76 umol/L — ABNORMAL HIGH (ref 9–35)

## 2018-02-03 LAB — PROTIME-INR
INR: 1.45
PROTHROMBIN TIME: 17.5 s — AB (ref 11.4–15.2)

## 2018-02-03 MED ORDER — SODIUM CHLORIDE 0.9 % IV SOLN
510.0000 mg | Freq: Once | INTRAVENOUS | Status: AC
  Administered 2018-02-03: 510 mg via INTRAVENOUS
  Filled 2018-02-03: qty 17

## 2018-02-03 MED ORDER — ALBUMIN HUMAN 25 % IV SOLN
25.0000 g | Freq: Once | INTRAVENOUS | Status: AC
  Administered 2018-02-03: 25 g via INTRAVENOUS
  Filled 2018-02-03: qty 100

## 2018-02-03 MED ORDER — ALBUMIN NICU 25% IV SOLUTION
25.0000 g | Freq: Once | INTRAVENOUS | Status: DC
  Filled 2018-02-03: qty 100

## 2018-02-03 MED ORDER — FUROSEMIDE 10 MG/ML IJ SOLN
40.0000 mg | Freq: Two times a day (BID) | INTRAMUSCULAR | Status: DC
Start: 2018-02-04 — End: 2018-02-04
  Administered 2018-02-04: 40 mg via INTRAVENOUS
  Filled 2018-02-03: qty 4

## 2018-02-03 MED ORDER — MAGNESIUM SULFATE 4 GM/100ML IV SOLN
4.0000 g | Freq: Once | INTRAVENOUS | Status: AC
  Administered 2018-02-03: 4 g via INTRAVENOUS
  Filled 2018-02-03: qty 100

## 2018-02-03 MED ORDER — SPIRONOLACTONE 25 MG PO TABS
100.0000 mg | ORAL_TABLET | Freq: Two times a day (BID) | ORAL | Status: DC
  Administered 2018-02-04: 100 mg via ORAL
  Filled 2018-02-03: qty 4

## 2018-02-03 NOTE — Progress Notes (Signed)
PROGRESS NOTE  Grant [name]  ZOX:[id number]  DOB: [birth year]  DOA: 01/31/2018 PCP: Patient, No Pcp Per  Brief Admission Hx: Grant [name] is a 37 y.o. male alcoholic with history of cirrhosis secondary to chronic alcoholism and thought to be related to hepatic steatosis reported that he has not had a paracentesis done since about 2013.  He had been doing fairly well and has been fairly stable.  He reports for the past several months he has noticed increasing abdominal distention, malaise and fatigue.   MDM/Assessment & Plan:   1. LLL Pneumonia -this pneumonia is felt to be community-acquired and will be treated with ceftriaxone and azithromycin.  Supportive therapy has been ordered.  Will monitor pulse ox and provide oxygen as needed. Follow cultures. He is clinically improving.  2. Alcoholic cirrhosis with ascites- thought to be secondary to combination of chronic alcoholism.  The patient did have an abdominal ultrasound but there was no mention of steatosis in the liver. GI has been consulted as patient says he has been lost to follow up. He is s/p paracentesis with 5.1L removed.  Cell counts not suggestive of SBP.  He is on diuretics now and weight slowly coming down.  GI is planning to repeat large volume paracentesis today with administration of albumin.  See orders and consult notes.  They plan to hold the diuretics today.   3. Splenomegaly - from chronic liver disease portal hypertension - stable.  4. Chronic alcoholism-the patient says that he completely stopped alcohol 1 month ago and has not restarted. UDS negative. 5. Thrombocytopenia-From chronic liver disease, stable, seen by hematology, no need for platelet transfusion.     6. Esophageal reflux-Protonix has been ordered. 7. Iron deficiency Anemia- Pt was seen by hematologist and no hemolysis suspected given normal LDH, he was ordered to receive iron transfusions and will follow up with hematology outpatient.  GI planning outpatient TCS.      8. Chronic alcohol abuse- Continue multivitamin, folic acid and thiamine. 9. Hypomagnesemia - Additional IV replacement ordered.   10. Hypokalemia - Repleted.   Oral replacement ordered. Magnesium replacement.   DVT Prophylaxis: SCDs Code Status: Full Family Communication: Patient updated at bedside Disposition Plan: The patient will require ongoing inpatient acute medical care.  Subjective: Pt says that he feels better, he would like more fluid removed from abdomen if possible.      Objective: Vitals:   02/02/18 0545 02/02/18 1410 02/02/18 2232 02/03/18 0600  BP: 121/67 107/66 (!) 111/54 117/60  Pulse: 77 74 78 72  Resp: Temp: 98.4 F (36.9 C)  98.5 F (36.9 C) 98.6 F (37 C)  TempSrc: Oral  Oral Oral  SpO2: 97% 100% 98% 98%  Weight:    117 kg (258 lb)  Height:        Intake/Output Summary (Last 24 hours) at 02/03/2018 0951 Last data filed at 02/02/2018 1700 Gross per 24 hour  Intake 1180 ml  Output -  Net 1180 ml   Filed Weights   02/01/18 0600 02/02/18 0500 02/03/18 0600  Weight: 128.5 kg (283 lb 4.7 oz) 121.3 kg (267 lb 6.7 oz) 117 kg (258 lb)   REVIEW OF SYSTEMS  As per history otherwise all reviewed and reported negative  Exam:  General exam: chronically ill appearing, no scleral icterus, NAD. Cooperative.  Respiratory system: BBS shallow but clear. No increased work of breathing. Cardiovascular system: S1 & S2 heard.  Bilateral LE edema. Gastrointestinal system: Abdomen is very distended,  soft and nontender. Normal bowel sounds heard. Central nervous system: Alert and oriented. No focal neurological deficits. Extremities: 2+ edema bilateral LEs.  Data Reviewed: Basic Metabolic Panel: Recent Labs  Lab 01/31/18 1140 01/31/18 1235 02/01/18 0454 02/02/18 0537 02/03/18 0539  NA 135  --  136 135 135  K 3.3*  --  3.5 4.0 3.8  CL 101  --  103 103 101  CO2 26  --  GLUCOSE 95  --  82 85 104*  BUN 5*  --  5* 5* 5*  CREATININE 0.46*   --  0.48* 0.48* 0.50*  CALCIUM 8.2*  --  8.1* 8.2* 8.4*  MG  --  1.3* 1.6* 1.6* 1.5*   Liver Function Tests: Recent Labs  Lab 01/31/18 1140 02/01/18 0454 02/02/18 0537 02/03/18 0539  AST 74* 66* 67* 68*  ALT ALKPHOS 150* 129* 123 111  BILITOT 3.3* 3.3* 2.8* 2.4*  PROT 7.0 6.4* 6.3* 6.3*  ALBUMIN 2.7* 2.5* 2.4* 2.4*   Recent Labs  Lab 01/31/18 1140  LIPASE 38   Recent Labs  Lab 02/01/18 0454 02/02/18 0537 02/03/18 0539  AMMONIA 89* 79* 76*   CBC: Recent Labs  Lab 01/31/18 1140 01/31/18 1235 02/01/18 0454 02/02/18 0537 02/03/18 0539  WBC 2.8*  --  3.6* 3.7* 3.3*  NEUTROABS  --  1.1* 1.4* 1.5* 1.2*  HGB 6.2*  --  7.0* 7.1* 7.2*  HCT 22.6*  --  23.8* 24.6* 24.7*  MCV 73.6*  --  75.1* 75.2* 75.8*  PLT 85*  --  79* 71* 71*   Cardiac Enzymes: No results for input(s): CKTOTAL, CKMB, CKMBINDEX, TROPONINI in the last 168 hours. CBG (last 3)  No results for input(s): GLUCAP in the last 72 hours. Recent Results (from the past 240 hour(s))  Culture, blood (Routine X 2) w Reflex to ID Panel     Status: None (Preliminary result)   Collection Time: 01/31/18  3:06 PM  Result Value Ref Range Status   Specimen Description LEFT ANTECUBITAL  Final   Special Requests   Final    BOTTLES DRAWN AEROBIC AND ANAEROBIC Blood Culture adequate volume   Culture   Final    NO GROWTH 3 DAYS Performed at [facility], [address] [place], [place] [address]    Report Status PENDING  Incomplete  Culture, blood (Routine X 2) w Reflex to ID Panel     Status: None (Preliminary result)   Collection Time: 01/31/18  3:06 PM  Result Value Ref Range Status   Specimen Description RIGHT ANTECUBITAL  Final   Special Requests   Final    BOTTLES DRAWN AEROBIC AND ANAEROBIC Blood Culture adequate volume   Culture   Final    NO GROWTH 3 DAYS Performed at [facility], [address] [place], [place] [address]    Report Status PENDING  Incomplete  Culture, body fluid-bottle      Status: None (Preliminary result)   Collection Time: 02/01/18  9:42 AM  Result Value Ref Range Status   Specimen Description ASCITIC  Final   Special Requests BOTTLES DRAWN AEROBIC AND ANAEROBIC 10 CC EACH  Final   Culture   Final    NO GROWTH 2 DAYS Performed at [facility], [address] [place], [place] [address]    Report Status PENDING  Incomplete  Gram stain     Status: None   Collection Time: 02/01/18  9:42 AM  Result Value Ref Range  Status   Specimen Description ASCITIC  Final   Special Requests NONE  Final   Gram Stain   Final    NO ORGANISMS SEEN WBC PRESENT, PREDOMINANTLY MONONUCLEAR CYTOSPIN SMEAR Performed at [facility], [address], [place], [place] [address]    Report Status 02/01/2018 FINAL  Final  Acid Fast Smear (AFB)     Status: None   Collection Time: 02/01/18 10:08 AM  Result Value Ref Range Status   AFB Specimen Processing Concentration  Final   Acid Fast Smear Negative  Final    Comment: (NOTE) Performed At: [facility] [address] [place], [place] [id number] Jolene Schimke MD AO:[id number]    Source (AFB) ASCITIC  Final    Comment: Performed at [facility], [address], [place], [place] [address]     Studies: US Paracentesis  Result Date: 02/01/2018 INDICATION: Alcoholic cirrhosis, ascites EXAM: ULTRASOUND GUIDED DIAGNOSTIC AND THERAPEUTIC PARACENTESIS MEDICATIONS: None. COMPLICATIONS: None immediate. PROCEDURE: Procedure, benefits, and risks of procedure were discussed with patient. Written informed consent for procedure was obtained. Time out protocol followed. Adequate collection of ascites localized by ultrasound in RIGHT lower quadrant. Skin prepped and draped in usual sterile fashion. Skin and soft tissues anesthetized with 10 mL of 1% lidocaine. 5 French 10 cm length Yueh catheter placed into peritoneal cavity. 5.18 L of yellow ascitic fluid aspirated by vacuum bottle suction. Procedure tolerated well by patient without  immediate complication. FINDINGS: A total of approximately 5.18 L of ascitic fluid was removed. Samples were sent to the laboratory as requested by the clinical team. IMPRESSION: Successful ultrasound-guided paracentesis yielding 5.18 liters of peritoneal fluid. Electronically Signed   By: Ulyses Southward M.D.   On: 02/01/2018 11:06   Scheduled Meds: . folic acid  1 mg Oral Daily  . furosemide  40 mg Intravenous BID AC  . multivitamin with minerals  1 tablet Oral Daily  . pantoprazole  40 mg Oral BID  . propranolol  20 mg Oral BID  . sodium chloride flush  3 mL Intravenous Q12H  . spironolactone  100 mg Oral BID AC  . thiamine  100 mg Oral Daily   Continuous Infusions: . sodium chloride    . albumin human    . azithromycin Stopped (02/02/18 1544)  . cefTRIAXone (ROCEPHIN)  IV Stopped (02/02/18 1448)   Principal Problem:   LLL pneumonia (HCC) Active Problems:   Alcoholic cirrhosis of liver with ascites (HCC)   Alcohol abuse   Anemia, unspecified   Thrombocytopenia (HCC)   Splenomegaly   Cirrhosis (HCC)   Hypomagnesemia   Ascites   Esophageal reflux   Hypokalemia  Time spent:   Standley Dakins, MD, FAAFP [facility] Pager [phone]  If 7PM-7AM, please contact night-coverage [email or web address] Password [email or web address] 02/03/2018, 9:51 AM    LOS: 3 days

## 2018-02-03 NOTE — Telephone Encounter (Signed)
PATIENT SCHEDULED  °

## 2018-02-03 NOTE — Progress Notes (Addendum)
Subjective:  Patient would like to have more fluid drawn off abdomen. He would also like iron infusion as the hematologist recommended. He denies abd pain. Edema improved.   Objective: Vital signs in last 24 hours: Temp:  [98.5 F (36.9 C)-98.6 F (37 C)] 98.6 F (37 C) (05/09 0600) Pulse Rate:  [72-78] 72 (05/09 0600) Resp:  [17-20] 20 (05/09 0600) BP: (107-117)/(54-66) 117/60 (05/09 0600) SpO2:  [98 %-100 %] 98 % (05/09 0600) Weight:  [258 lb (117 kg)] 258 lb (117 kg) (05/09 0600) Last BM Date: 02/03/18 General:   Alert,  Well-developed, well-nourished, pleasant and cooperative in NAD Head:  Normocephalic and atraumatic. Eyes:  Sclera clear, no icterus.  Abdomen:  Soft, distended, nontender. Normal bowel sounds, without guarding, and without rebound.  Umb hernia nontender, easily reducible.  Extremities:  Without clubbing, deformity. 2-3+ pitting edema in lower extremities, 1+ above knees bilaterally. Neurologic:  Alert and  oriented x4;  grossly normal neurologically. Skin:  Intact without significant lesions or rashes. Psych:  Alert and cooperative. Normal mood and affect.  Intake/Output from previous day: 05/08 0701 - 05/09 0700 In: 1480 [P.O.:780; IV Piggyback:700] Out: -  Intake/Output this shift: No intake/output data recorded.  Lab Results: CBC Recent Labs    02/01/18 0454 02/02/18 0537 02/03/18 0539  WBC 3.6* 3.7* 3.3*  HGB 7.0* 7.1* 7.2*  HCT 23.8* 24.6* 24.7*  MCV 75.1* 75.2* 75.8*  PLT 79* 71* 71*   BMET Recent Labs    02/01/18 0454 02/02/18 0537 02/03/18 0539  NA 136 135 135  K 3.5 4.0 3.8  CL 103 103 101  CO2 GLUCOSE 82 85 104*  BUN 5* 5* 5*  CREATININE 0.48* 0.48* 0.50*  CALCIUM 8.1* 8.2* 8.4*   LFTs Recent Labs    02/01/18 0454 02/02/18 0537 02/03/18 0539  BILITOT 3.3* 2.8* 2.4*  ALKPHOS 129* 123 111  AST 66* 67* 68*  ALT PROT 6.4* 6.3* 6.3*  ALBUMIN 2.5* 2.4* 2.4*   Recent Labs    01/31/18 1140  LIPASE 38    PT/INR Recent Labs    02/01/18 0454 02/02/18 0537 02/03/18 0539  LABPROT 18.4* 16.5* 17.5*  INR 1.54 1.35 1.45      Imaging Studies: Dg Chest 2 View  Result Date: 01/31/2018 CLINICAL DATA:  Abdominal swelling EXAM: CHEST - 2 VIEW COMPARISON:  None. FINDINGS: Cardiac shadow is within normal limits. The lungs are well aerated bilaterally. Left lower lobe infiltrate with associated effusion is seen. IMPRESSION: Left lower lobe pneumonia Electronically Signed   By: Alcide Clever M.D.   On: 01/31/2018 13:45   US Abdomen Complete  Result Date: 01/31/2018 CLINICAL DATA:  37 year old male with abdominal swelling, history of alcoholic cirrhosis and ascites EXAM: ABDOMEN ULTRASOUND COMPLETE COMPARISON:  None. FINDINGS: Gallbladder: No gallstones or wall thickening visualized. No sonographic Murphy sign noted by sonographer. Common bile duct: Diameter: Normal at 3 mm Liver: The hepatic contour appears mildly nodular. There is relatively increased echogenicity and coarsening of the echotexture. No discrete mass or lesion identified. Portal vein is patent on color Doppler imaging with normal direction of blood flow towards the liver. IVC: No abnormality visualized. Pancreas: Visualized portion unremarkable. Spleen: Marked splenomegaly. The spleen measures up to 18.7 cm with a volume of 1302 cubic cm (top-normal is 411 cubic cm). Right Kidney: Length: 12.1 cm. Echogenicity within normal limits. No mass or hydronephrosis visualized. Left Kidney: Length: 12.0 cm. Echogenicity within normal limits. No mass or  hydronephrosis visualized. Abdominal aorta: No aneurysm visualized. Other findings: Trace ascites. IMPRESSION: 1. Hepatic cirrhosis with marked splenomegaly suggesting underlying portal hypertension. 2. Only trace perihepatic ascites, insufficient for paracentesis. 3. No discrete hepatic lesion identified. Electronically Signed   By: Malachy Moan M.D.   On: 01/31/2018 13:42   US Paracentesis  Result  Date: 02/01/2018 INDICATION: Alcoholic cirrhosis, ascites EXAM: ULTRASOUND GUIDED DIAGNOSTIC AND THERAPEUTIC PARACENTESIS MEDICATIONS: None. COMPLICATIONS: None immediate. PROCEDURE: Procedure, benefits, and risks of procedure were discussed with patient. Written informed consent for procedure was obtained. Time out protocol followed. Adequate collection of ascites localized by ultrasound in RIGHT lower quadrant. Skin prepped and draped in usual sterile fashion. Skin and soft tissues anesthetized with 10 mL of 1% lidocaine. 5 French 10 cm length Yueh catheter placed into peritoneal cavity. 5.18 L of yellow ascitic fluid aspirated by vacuum bottle suction. Procedure tolerated well by patient without immediate complication. FINDINGS: A total of approximately 5.18 L of ascitic fluid was removed. Samples were sent to the laboratory as requested by the clinical team. IMPRESSION: Successful ultrasound-guided paracentesis yielding 5.18 liters of peritoneal fluid. Electronically Signed   By: Ulyses Southward M.D.   On: 02/01/2018 11:06  [2 weeks]   Assessment:    37 year old male with history of chronic alcohol abuse now sober for one month, history of alcoholic hepatitis in 2013/2017, presenting with decompensated cirrhosis with anasarca in setting of pneumonia. Discriminant function ranging from 18-35 (depending on PT control). MELD Na 18, Child Pugh C. He has previously been evaluated at [facility] in 2013, Dr. 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 Notable worsening of abdominal distension and lower extremity over the past month prompted presentation to ED.  Cirrhosis: with marked anasarca. No clinical signs of encephalopathy. Paracentesis this admission (5.18L) with full fluid analysis, initial cell count without concern for SBP. With MELD Na 18, Child  Pugh C, overall poor prognosis especially if continues to drink. LFT/PT have improved this admission. Encouraged ongoing ETOH cessation.  Not a liver transplant candidate at this point due to recent ETOH use but can arrange as outpatient. Currently without insurance but in process of applying for Medicaid.  Will need EGD as outpatient for reassessment as he has had evidence of decompensation.   Hypoalbuminemia: in setting of advanced liver disease.   Anasarca: on IV lasix. Continues to diurese. Change diet to 2 gram sodium diet.weight down 30 pounds this admission.  Thrombocytopenia: in setting of advanced liver disease.   Anemia: unknown baseline but was in the 9/10 range in 2017. Admitting Hgb 6.2, s/p 2 units PRBCs. No overt GI bleeding. Unknown hemoccult status. Monitor for overt GI bleeding. Likely multifactorial in setting of chronic disease. Anemia panel with evidence of IDA. Has seen hematology and recommendations for one iron infusion this admission but no orders placed. To discuss with hematology.   Plan: 1. LVAP today. IV albumin 25 grams at time of procedure.  2. Will hold on diuretics today given LVAP, resume tomorrow.  3. Will discuss with hematology about IV feraheme this admission.  4. Will follow up as outpatient for EGD/TCS for IDA/surveillance esoph varices.  5. F/u pending labs/fluid studies.  6. etoh cessation.  7. Outpatient liver transplant center referral if able to maintain etoh abstinence.   Leanna Battles. Dixon Boos [facility] [phone] 5/9/20199:31 AM  LOS: 3 days    Addendum: I spoke with hematology who recommended IV feraheme. I have ordered under their recommendations and patient's request.   Leanna Battles. Dixon Boos [facility] [phone] 5/9/20191:36 PM

## 2018-02-03 NOTE — Procedures (Signed)
PreOperative Dx: Alcoholic cirrhosis, ascites Postoperative Dx: Alcoholic cirrhosis, ascites Procedure:   US guided paracentesis Radiologist:  Anetha Slagel Anesthesia:  10 ml of1% lidocaine Specimen:  4.8 L of yellow ascitic fluid EBL:   < 1 ml Complications: None  

## 2018-02-03 NOTE — Telephone Encounter (Signed)
Patient will need follow in 6 weeks from hospitalization to discuss EGD/TCS for IDA, cirrhosis. Dr. Darrick Penna or APP.

## 2018-02-04 LAB — COMPREHENSIVE METABOLIC PANEL
ALT: 22 U/L (ref 17–63)
AST: 67 U/L — AB (ref 15–41)
Albumin: 2.5 g/dL — ABNORMAL LOW (ref 3.5–5.0)
Alkaline Phosphatase: 98 U/L (ref 38–126)
Anion gap: 7 (ref 5–15)
BUN: 6 mg/dL (ref 6–20)
CHLORIDE: 102 mmol/L (ref 101–111)
CO2: 26 mmol/L (ref 22–32)
CREATININE: 0.41 mg/dL — AB (ref 0.61–1.24)
Calcium: 8.5 mg/dL — ABNORMAL LOW (ref 8.9–10.3)
GFR calc Af Amer: >60 mL/min (ref >60)
Glucose, Bld: 93 mg/dL (ref 65–99)
Potassium: 3.5 mmol/L (ref 3.5–5.1)
Sodium: 135 mmol/L (ref 135–145)
Total Bilirubin: 2.1 mg/dL — ABNORMAL HIGH (ref 0.3–1.2)
Total Protein: 6.2 g/dL — ABNORMAL LOW (ref 6.5–8.1)

## 2018-02-04 LAB — CBC WITH DIFFERENTIAL/PLATELET
BASOS ABS: 0.1 10*3/uL (ref 0.0–0.1)
BASOS PCT: 3 %
EOS PCT: 7 %
Eosinophils Absolute: 0.2 10*3/uL (ref 0.0–0.7)
HCT: 24.1 % — ABNORMAL LOW (ref 39.0–52.0)
HEMOGLOBIN: 6.9 g/dL — AB (ref 13.0–17.0)
Lymphocytes Relative: 32 %
Lymphs Abs: 1 10*3/uL (ref 0.7–4.0)
MCH: 21.7 pg — AB (ref 26.0–34.0)
MCHC: 28.6 g/dL — ABNORMAL LOW (ref 30.0–36.0)
MCV: 75.8 fL — ABNORMAL LOW (ref 78.0–100.0)
MONO ABS: 0.8 10*3/uL (ref 0.1–1.0)
Monocytes Relative: 27 %
Neutro Abs: 0.9 10*3/uL — ABNORMAL LOW (ref 1.7–7.7)
Neutrophils Relative %: 31 %
PLATELETS: 72 10*3/uL — AB (ref 150–400)
RBC: 3.18 MIL/uL — AB (ref 4.22–5.81)
RDW: 22.3 % — ABNORMAL HIGH (ref 11.5–15.5)
WBC: 3 10*3/uL — AB (ref 4.0–10.5)

## 2018-02-04 LAB — PROTIME-INR
INR: 1.53
PROTHROMBIN TIME: 18.2 s — AB (ref 11.4–15.2)

## 2018-02-04 LAB — PREPARE RBC (CROSSMATCH)

## 2018-02-04 LAB — MAGNESIUM: MAGNESIUM: 1.4 mg/dL — AB (ref 1.7–2.4)

## 2018-02-04 MED ORDER — PROPRANOLOL HCL 20 MG PO TABS: 20.0000 mg | ORAL_TABLET | Freq: Two times a day (BID) | ORAL | 0 refills | Status: DC

## 2018-02-04 MED ORDER — PANTOPRAZOLE SODIUM 40 MG PO TBEC: 40.0000 mg | DELAYED_RELEASE_TABLET | Freq: Two times a day (BID) | ORAL | 0 refills | Status: DC

## 2018-02-04 MED ORDER — MAGNESIUM SULFATE 4 GM/100ML IV SOLN
4.0000 g | Freq: Once | INTRAVENOUS | Status: AC
  Administered 2018-02-04: 4 g via INTRAVENOUS
  Filled 2018-02-04: qty 100

## 2018-02-04 MED ORDER — DOXYCYCLINE HYCLATE 100 MG PO CAPS: 100.0000 mg | ORAL_CAPSULE | Freq: Two times a day (BID) | ORAL | 0 refills | Status: AC

## 2018-02-04 MED ORDER — MAGNESIUM OXIDE 250 MG PO TABS: 1.0000 | ORAL_TABLET | Freq: Two times a day (BID) | ORAL | 0 refills | Status: AC

## 2018-02-04 MED ORDER — SPIRONOLACTONE 100 MG PO TABS: 100.0000 mg | ORAL_TABLET | Freq: Two times a day (BID) | ORAL | 0 refills | Status: DC

## 2018-02-04 MED ORDER — SODIUM CHLORIDE 0.9 % IV SOLN
Freq: Once | INTRAVENOUS | Status: AC
  Administered 2018-02-04: 08:00:00 via INTRAVENOUS

## 2018-02-04 MED ORDER — FUROSEMIDE 40 MG PO TABS: 40.0000 mg | ORAL_TABLET | Freq: Two times a day (BID) | ORAL | 0 refills | Status: DC

## 2018-02-04 MED ORDER — AZITHROMYCIN 250 MG PO TABS
500.0000 mg | ORAL_TABLET | Freq: Every day | ORAL | Status: DC
  Administered 2018-02-04: 500 mg via ORAL
  Filled 2018-02-04: qty 2

## 2018-02-04 NOTE — Discharge Instructions (Signed)
Follow with Primary MD  Patient, No Pcp Per  and other consultant's as instructed your Hospitalist MD  Please get a complete blood count and chemistry panel checked by your Primary MD at your next visit, and again as instructed by your Primary MD.  Get Medicines reviewed and adjusted: Please take all your medications with you for your next visit with your Primary MD  Laboratory/radiological data: Please request your Primary MD to go over all hospital tests and procedure/radiological results at the follow up, please ask your Primary MD to get all Hospital records sent to his/her office.  In some cases, they will be blood work, cultures and biopsy results pending at the time of your discharge. Please request that your primary care M.D. follows up on these results.  Also Note the following: If you experience worsening of your admission symptoms, develop shortness of breath, life threatening emergency, suicidal or homicidal thoughts you must seek medical attention immediately by calling 911 or calling your MD immediately  if symptoms less severe.  You must read complete instructions/literature along with all the possible adverse reactions/side effects for all the Medicines you take and that have been prescribed to you. Take any new Medicines after you have completely understood and accpet all the possible adverse reactions/side effects.   Do not drive when taking Pain medications or sleeping medications (Benzodaizepines)  Do not take more than prescribed Pain, Sleep and Anxiety Medications. It is not advisable to combine anxiety,sleep and pain medications without talking with your primary care practitioner  Special Instructions: If you have smoked or chewed Tobacco  in the last 2 yrs please stop smoking, stop any regular Alcohol  and or any Recreational drug use.  Wear Seat belts while driving.  Please note: You were cared for by a hospitalist during your hospital stay. Once you are discharged,  your primary care physician will handle any further medical issues. Please note that NO REFILLS for any discharge medications will be authorized once you are discharged, as it is imperative that you return to your primary care physician (or establish a relationship with a primary care physician if you do not have one) for your post hospital discharge needs so that they can reassess your need for medications and monitor your lab values.     Alcoholic Liver Disease Alcoholic liver disease happens when the liver does not work the way it should. The condition is caused by drinking too much alcohol for many years. Follow these instructions at home:  Do not drink alcohol.  Take medicines only as told by your doctor.  Take vitamins only as told by your doctor.  Follow any diet instructions that your doctor gave you. You may need to: ? Eat foods that have thiamine. These include whole-wheat cereals, pork, and raw vegetables. ? Eat foods that have folic acid. These include vegetables, fruits, meats, beans, nuts, and dairy foods. ? Eat foods that are high in carbohydrates. These include yogurt, beans, potatoes, and rice. Contact a doctor if:  You have a fever.  You are short of breath.  You have trouble breathing.  You have bright red blood in your poop (stool).  Your poop looks like tar.  You throw up (vomit) blood.  Your skin looks more yellow, pale, or dark.  You get headaches.  You have trouble thinking.  You have trouble balancing or walking. This information is not intended to replace advice given to you by your health care provider. Make sure you discuss any questions  you have with your health care provider. Document Released: 07/12/2009 Document Revised: 02/20/2016 Document Reviewed: 08/16/2014 Elsevier Interactive Patient Education  2018 [employer].   Ascites Ascites is a collection of excess fluid in the abdomen. Ascites can range from mild to severe. It can get worse  without treatment. What are the causes? Possible causes include:  Cirrhosis. This is the most common cause of ascites.  Infection or inflammation in the abdomen.  Cancer in the abdomen.  Heart failure.  Kidney disease.  Inflammation of the pancreas.  Clots in the veins of the liver.  What are the signs or symptoms? Signs and symptoms may include:  A feeling of fullness in your abdomen. This is common.  An increase in the size of your abdomen or your waist.  Swelling in your legs.  Swelling of the scrotum in men.  Difficulty breathing.  Abdominal pain.  Sudden weight gain.  If the condition is mild, you may not have symptoms. How is this diagnosed? To make a diagnosis, your health care provider will:  Ask about your medical history.  Perform a physical exam.  Order imaging tests, such as an ultrasound or CT scan of your abdomen.  How is this treated? Treatment depends on the cause of the ascites. It may include:  Taking a pill to make you urinate. This is called a water pill (diuretic pill).  Strictly reducing your salt (sodium) intake. Salt can cause extra fluid to be kept in the body, and this makes ascites worse.  Having a procedure to remove fluid from your abdomen (paracentesis).  Having a procedure to transfer fluid from your abdomen into a vein.  Having a procedure that connects two of the major veins within your liver and relieves pressure on your liver (TIPS procedure).  Ascites may go away or improve with treatment of the condition that caused it. Follow these instructions at home:  Keep track of your weight. To do this, weigh yourself at the same time every day and record your weight.  Keep track of how much you drink and any changes in the amount you urinate.  Follow any instructions that your health care provider gives you about how much to drink.  Try not to eat salty (high-sodium) foods.  Take medicines only as directed by your health  care provider.  Keep all follow-up visits as directed by your health care provider. This is important.  Report any changes in your health to your health care provider, especially if you develop new symptoms or your symptoms get worse. Contact a health care provider if:  Your gain more than 3 pounds in 3 days.  Your abdominal size or your waist size increases.  You have new swelling in your legs.  The swelling in your legs gets worse. Get help right away if:  You develop a fever.  You develop confusion.  You develop new or worsening difficulty breathing.  You develop new or worsening abdominal pain.  You develop new or worsening swelling in the scrotum (in men). This information is not intended to replace advice given to you by your health care provider. Make sure you discuss any questions you have with your health care provider. Document Released: 09/14/2005 Document Revised: 01/22/2016 Document Reviewed: 04/13/2014 Elsevier Interactive Patient Education  2018 [employer].   Anemia Anemia is a condition in which you do not have enough red blood cells or hemoglobin. Hemoglobin is a substance in red blood cells that carries oxygen. When you do not  have enough red blood cells or hemoglobin (are anemic), your body cannot get enough oxygen and your organs may not work properly. As a result, you may feel very tired or have other problems. What are the causes? Common causes of anemia include:  Excessive bleeding. Anemia can be caused by excessive bleeding inside or outside the body, including bleeding from the intestine or from periods in women.  Poor nutrition.  Long-lasting (chronic) kidney, thyroid, and liver disease.  Bone marrow disorders.  Cancer and treatments for cancer.  HIV (human immunodeficiency virus) and AIDS (acquired immunodeficiency syndrome).  Treatments for HIV and AIDS.  Spleen problems.  Blood disorders.  Infections, medicines, and autoimmune  disorders that destroy red blood cells.  What are the signs or symptoms? Symptoms of this condition include:  Minor weakness.  Dizziness.  Headache.  Feeling heartbeats that are irregular or faster than normal (palpitations).  Shortness of breath, especially with exercise.  Paleness.  Cold sensitivity.  Indigestion.  Nausea.  Difficulty sleeping.  Difficulty concentrating.  Symptoms may occur suddenly or develop slowly. If your anemia is mild, you may not have symptoms. How is this diagnosed? This condition is diagnosed based on:  Blood tests.  Your medical history.  A physical exam.  Bone marrow biopsy.  Your health care provider may also check your stool (feces) for blood and may do additional testing to look for the cause of your bleeding. You may also have other tests, including:  Imaging tests, such as a CT scan or MRI.  Endoscopy.  Colonoscopy.  How is this treated? Treatment for this condition depends on the cause. If you continue to lose a lot of blood, you may need to be treated at a hospital. Treatment may include:  Taking supplements of iron, vitamin M01, or folic acid.  Taking a hormone medicine (erythropoietin) that can help to stimulate red blood cell growth.  Having a blood transfusion. This may be needed if you lose a lot of blood.  Making changes to your diet.  Having surgery to remove your spleen.  Follow these instructions at home:  Take over-the-counter and prescription medicines only as told by your health care provider.  Take supplements only as told by your health care provider.  Follow any diet instructions that you were given.  Keep all follow-up visits as told by your health care provider. This is important. Contact a health care provider if:  You develop new bleeding anywhere in the body. Get help right away if:  You are very weak.  You are short of breath.  You have pain in your abdomen or chest.  You are  dizzy or feel faint.  You have trouble concentrating.  You have bloody or black, tarry stools.  You vomit repeatedly or you vomit up blood. Summary  Anemia is a condition in which you do not have enough red blood cells or enough of a substance in your red blood cells that carries oxygen (hemoglobin).  Symptoms may occur suddenly or develop slowly.  If your anemia is mild, you may not have symptoms.  This condition is diagnosed with blood tests as well as a medical history and physical exam. Other tests may be needed.  Treatment for this condition depends on the cause of the anemia. This information is not intended to replace advice given to you by your health care provider. Make sure you discuss any questions you have with your health care provider. Document Released: 10/22/2004 Document Revised: 10/16/2016 Document Reviewed: 10/16/2016 Elsevier  Interactive Patient Education  2018 [employer].   Blood Transfusion, Care After This sheet gives you information about how to care for yourself after your procedure. Your doctor may also give you more specific instructions. If you have problems or questions, contact your doctor. Follow these instructions at home:  Take over-the-counter and prescription medicines only as told by your doctor.  Go back to your normal activities as told by your doctor.  Follow instructions from your doctor about how to take care of the area where an IV tube was put into your vein (insertion site). Make sure you: ? Wash your hands with soap and water before you change your bandage (dressing). If there is no soap and water, use hand sanitizer. ? Change your bandage as told by your doctor.  Check your IV insertion site every day for signs of infection. Check for: ? More redness, swelling, or pain. ? More fluid or blood. ? Warmth. ? Pus or a bad smell. Contact a doctor if:  You have more redness, swelling, or pain around the IV insertion site..  You  have more fluid or blood coming from the IV insertion site.  Your IV insertion site feels warm to the touch.  You have pus or a bad smell coming from the IV insertion site.  Your pee (urine) turns pink, red, or brown.  You feel weak after doing your normal activities. Get help right away if:  You have signs of a serious allergic or body defense (immune) system reaction, including: ? Itchiness. ? Hives. ? Trouble breathing. ? Anxiety. ? Pain in your chest or lower back. ? Fever, flushing, and chills. ? Fast pulse. ? Rash. ? Watery poop (diarrhea). ? Throwing up (vomiting). ? Dark pee. ? Serious headache. ? Dizziness. ? Stiff neck. ? Yellow color in your face or the white parts of your eyes (jaundice). Summary  After a blood transfusion, return to your normal activities as told by your doctor.  Every day, check for signs of infection where the IV tube was put into your vein.  Some signs of infection are warm skin, more redness and pain, more fluid or blood, and pus or a bad smell where the needle went in.  Contact your doctor if you feel weak or have any unusual symptoms. This information is not intended to replace advice given to you by your health care provider. Make sure you discuss any questions you have with your health care provider. Document Released: 10/05/2014 Document Revised: 05/08/2016 Document Reviewed: 05/08/2016 Elsevier Interactive Patient Education  2017 [employer]   Cirrhosis Cirrhosis is long-term (chronic) liver injury. The liver is your largest internal organ, and it performs many functions. The liver converts food into energy, removes toxic material from your blood, makes important proteins, and absorbs necessary vitamins from your diet. If you have cirrhosis, it means many of your healthy liver cells have been replaced by scar tissue. This prevents blood from flowing through your liver, which makes it difficult for your liver to function. This  scarring is not reversible, but treatment can prevent it from getting worse. What are the causes? Hepatitis C and long-term alcohol abuse are the most common causes of cirrhosis. Other causes include:  Nonalcoholic fatty liver disease.  Hepatitis B infection.  Autoimmune hepatitis.  Diseases that cause blockage of ducts inside the liver.  Inherited liver diseases.  Reactions to certain long-term medicines.  Parasitic infections.  Long-term exposure to certain toxins.  What increases the risk? You may  have a higher risk of cirrhosis if you:  Have certain hepatitis viruses.  Abuse alcohol, especially if you are male.  Are overweight.  Share needles.  Have unprotected sex with someone who has hepatitis.  What are the signs or symptoms? You may not have any signs and symptoms at first. Symptoms may not develop until the damage to your liver starts to get worse. Signs and symptoms of cirrhosis may include:  Tenderness in the right-upper part of your abdomen.  Weakness and tiredness (fatigue).  Loss of appetite.  Nausea.  Weight loss and muscle loss.  Itchiness.  Yellow skin and eyes (jaundice).  Buildup of fluid in the abdomen (ascites).  Swelling of the feet and ankles (edema).  Appearance of tiny blood vessels under the skin.  Mental confusion.  Easy bruising and bleeding.  How is this diagnosed? Your health care provider may suspect cirrhosis based on your symptoms and medical history, especially if you have other medical conditions or a history of alcohol abuse. Your health care provider will do a physical exam to feel your liver and check for signs of cirrhosis. Your health care provider may perform other tests, including:  Blood tests to check: ? Whether you have hepatitis B or C. ? Kidney function. ? Liver function.  Imaging tests such as: ? MRI or CT scan to look for changes seen in advanced cirrhosis. ? Ultrasound to see if normal liver  tissue is being replaced by scar tissue.  A procedure using a long needle to take a sample of liver tissue (biopsy) for examination under a microscope. Liver biopsy can confirm the diagnosis of cirrhosis.  How is this treated? Treatment depends on how damaged your liver is and what caused the damage. Treatment may include treating cirrhosis symptoms or treating the underlying causes of the condition to try to slow the progression of the damage. Treatment may include:  Making lifestyle changes, such as: ? Eating a healthy diet. ? Restricting salt intake. ? Maintaining a healthy weight. ? Not abusing drugs or alcohol.  Taking medicines to: ? Treat liver infections or other infections. ? Control itching. ? Reduce fluid buildup. ? Reduce certain blood toxins. ? Reduce risk of bleeding from enlarged blood vessels in the stomach or esophagus (varices).  If varices are causing bleeding problems, you may need treatment with a procedure that ties up the vessels causing them to fall off (band ligation).  If cirrhosis is causing your liver to fail, your health care provider may recommend a liver transplant.  Other treatments may be recommended depending on any complications of cirrhosis, such as liver-related kidney failure (hepatorenal syndrome).  Follow these instructions at home:  Take medicines only as directed by your health care provider. Do not use drugs that are toxic to your liver. Ask your health care provider before taking any new medicines, including over-the-counter medicines.  Rest as needed.  Eat a well-balanced diet. Ask your health care provider or dietitian for more information.  You may have to follow a low-salt diet or restrict your water intake as directed.  Do not drink alcohol. This is especially important if you are taking acetaminophen.  Keep all follow-up visits as directed by your health care provider. This is important. Contact a health care provider if:  You  have fatigue or weakness that is getting worse.  You develop swelling of the hands, feet, legs, or face.  You have a fever.  You develop loss of appetite.  You have nausea or  vomiting.  You develop jaundice.  You develop easy bruising or bleeding. Get help right away if:  You vomit bright red blood or a material that looks like coffee grounds.  You have blood in your stools.  Your stools appear black and tarry.  You become confused.  You have chest pain or trouble breathing. This information is not intended to replace advice given to you by your health care provider. Make sure you discuss any questions you have with your health care provider. Document Released: 09/14/2005 Document Revised: 01/23/2016 Document Reviewed: 05/23/2014 Elsevier Interactive Patient Education  2018 [place]   Community-Acquired Pneumonia, Adult Pneumonia is an infection of the lungs. One type of pneumonia can happen while a person is in a hospital. A different type can happen when a person is not in a hospital (community-acquired pneumonia). It is easy for this kind to spread from person to person. It can spread to you if you breathe near an infected person who coughs or sneezes. Some symptoms include:  A dry cough.  A wet (productive) cough.  Fever.  Sweating.  Chest pain.  Follow these instructions at home:  Take over-the-counter and prescription medicines only as told by your doctor. ? Only take cough medicine if you are losing sleep. ? If you were prescribed an antibiotic medicine, take it as told by your doctor. Do not stop taking the antibiotic even if you start to feel better.  Sleep with your head and neck raised (elevated). You can do this by putting a few pillows under your head, or you can sleep in a recliner.  Do not use tobacco products. These include cigarettes, chewing tobacco, and e-cigarettes. If you need help quitting, ask your doctor.  Drink enough water to keep  your pee (urine) clear or pale yellow. A shot (vaccine) can help prevent pneumonia. Shots are often suggested for:  People older than 36 years of age.  People older than 36 years of age: ? Who are having cancer treatment. ? Who have long-term (chronic) lung disease. ? Who have problems with their body's defense system (immune system).  You may also prevent pneumonia if you take these actions:  Get the flu (influenza) shot every year.  Go to the dentist as often as told.  Wash your hands often. If soap and water are not available, use hand sanitizer.  Contact a doctor if:  You have a fever.  You lose sleep because your cough medicine does not help. Get help right away if:  You are short of breath and it gets worse.  You have more chest pain.  Your sickness gets worse. This is very serious if: ? You are an older adult. ? Your body's defense system is weak.  You cough up blood. This information is not intended to replace advice given to you by your health care provider. Make sure you discuss any questions you have with your health care provider. Document Released: 03/02/2008 Document Revised: 02/20/2016 Document Reviewed: 01/09/2015 Elsevier Interactive Patient Education  2018 [place].  Paracentesis, Care After Refer to this sheet in the next few weeks. These instructions provide you with information about caring for yourself after your procedure. Your health care provider may also give you more specific instructions. Your treatment has been planned according to current medical practices, but problems sometimes occur. Call your health care provider if you have any problems or questions after your procedure. What can I expect after the procedure? After your procedure, it is common to  have a small amount of clear fluid coming from the puncture site. Follow these instructions at home:  Return to your normal activities as told by your health care provider. Ask your health  care provider what activities are safe for you.  Take over-the-counter and prescription medicines only as told by your health care provider.  Do not take baths, swim, or use a hot tub until your health care provider approves.  Follow instructions from your health care provider about: ? How to take care of your puncture site. ? When and how you should change your bandage (dressing). ? When you should remove your dressing.  Check your puncture area every day signs of infection. Watch for: ? Redness, swelling, or pain. ? Fluid, blood, or pus.  Keep all follow-up visits as told by your health care provider. This is important. Contact a health care provider if:  You have redness, swelling, or pain at your puncture site.  You start to have more clear fluid coming from your puncture site.  You have blood or pus coming from your puncture site.  You have chills.  You have a fever. Get help right away if:  You develop chest pain or shortness of breath.  You develop increasing pain, discomfort, or swelling in your abdomen.  You feel dizzy or light-headed or you pass out. This information is not intended to replace advice given to you by your health care provider. Make sure you discuss any questions you have with your health care provider. Document Released: 01/29/2015 Document Revised: 02/20/2016 Document Reviewed: 11/27/2014 Elsevier Interactive Patient Education  2018 [employer].

## 2018-02-04 NOTE — Progress Notes (Signed)
CRITICAL VALUE ALERT  Critical Value:  Hemiglobin 6.9  Date & Time Notied:  02/04/18 0700  Provider Notified: dr. Laural Benes  Orders Received/Actions taken:

## 2018-02-04 NOTE — Progress Notes (Signed)
Patient is to be discharged home and in stable condition. Patient's IV removed, WNL. Patient given discharge instructions and verbalized understanding. Patient denies the need for a wheelchair escort out.  Nthony Lefferts P Dishmon, RN  

## 2018-02-04 NOTE — Discharge Summary (Signed)
Physician Discharge Summary  Grant [name] ZOX:[id number] DOB: 1981/06/28 DOA: 01/31/2018  PCP: Patient, No Pcp Per GI: [facility] Admit date: 01/31/2018 Discharge date: 02/04/2018  Admitted From:  Home  Disposition: Home   Recommendations for Outpatient Follow-up:  1. Follow up with PCP in 1 weeks 2. Follow up with GI in 2 weeks 3. Check BMP in 7-10 days 4. Recheck magnesium level in 1-2 weeks  Discharge Condition: STABLE   CODE STATUS: FULL   Brief Hospitalization Summary: Please see all hospital notes, images, labs for full details of the hospitalization.  HPI: Grant [name] is a 37 y.o. male alcoholic with history of cirrhosis secondary to chronic alcoholism and thought to be related to hepatic steatosis reported that he has not had a paracentesis done since about 2013.  He had been doing fairly well and has been fairly stable.  He reports for the past several months he has noticed increasing abdominal distention, malaise and fatigue.  He reports that he has not followed up with GI since 2013.  He was admitted back in 2017 at [facility].  He denies having chest pain and shortness of breath.  He denies fever chills cough and rash.   The patient reports that he weaned himself off alcohol and reports that he has not had alcohol in the past month.  His serum alcohol level is negative today.  He denies any other recreational drug usage.  He has had alcohol withdrawal in the past but says that because he was able to wean off the alcohol slowly over a long period of time he has not been having withdrawals.     ED course: The patient was evaluated in the emergency department and found to have severe anemia with a hemoglobin of 6.6.  He also has severe thrombocytopenia with a platelet count of 85.  The patient has no signs or symptoms of acute bleeding from the GI tract at this time.  The patient had a chest x-ray that was positive for left lower lobe pneumonia.  The patient was started on treatment  for low magnesium at 1.3.  He was also noted to have a low potassium and was given additional supplementation.  The patient will be admitted to the hospital for ongoing medical care.  Brief Admission Hx: Grant [name] a 37 y.o.malealcoholic with history of cirrhosis secondary to chronic alcoholism and thought to be related to hepatic steatosis reported that he has not had a paracentesis done since about 2013. He had been doing fairly well and has been fairly stable. He reports for the past several months he has noticed increasing abdominal distention, malaise and fatigue.   MDM/Assessment & Plan:   1. LLL Pneumonia -this pneumonia is felt to be community-acquired and will be treated with ceftriaxone and azithromycin. Supportive therapy has been ordered. Will monitor pulse ox and provide oxygen as needed. Follow cultures. He is clinically improving. Discharge home on oral doxycycline.  2. Alcoholic cirrhosis with ascites- thought to be secondary to chronic alcoholism. The patient did have an abdominal ultrasound but there was no mention of steatosis in the liver. GI has been consulted as patient says he has been lost to follow up. He is s/p paracentesis with 5.1L removed on first attempt and 4L removed on second attempt.  Cell counts not suggestive of SBP.  He is on diuretics now and weight slowly coming down.   3. Splenomegaly - from chronic liver disease portal hypertension - stable.  4. Chronic alcoholism-the patient says  that he completely stopped alcohol 1 month ago and has not restarted. UDS negative. 5. Thrombocytopenia-From chronic liver disease, stable, seen by hematology, no need for platelet transfusion.   6. Esophageal reflux-Protonix has been ordered. 7. Iron deficiency Anemia- Pt was seen by hematologist and no hemolysis suspected given normal LDH, he was ordered to receive iron transfusions and will follow up with hematology outpatient.  GI planning outpatient TCS.     8. Chronic alcohol abuse-Continue multivitamin, folic acid and thiamine. 9. Hypomagnesemia - Additional IV replacement ordered.  Oral replacement ordered at discharge, recheck outpatient.  10. Hypokalemia - Repleted.   Oral replacement ordered. Magnesium replacement.  DVT Prophylaxis:SCDs Code Status:Full Family Communication:Patient updated at bedside Disposition Plan:Home   Discharge Diagnoses:  Principal Problem:   LLL pneumonia (HCC) Active Problems:   Alcoholic cirrhosis of liver with ascites (HCC)   Alcohol abuse   Symptomatic anemia   Thrombocytopenia (HCC)   Splenomegaly   Cirrhosis (HCC)   Hypomagnesemia   Ascites   Esophageal reflux   Hypokalemia   Abdominal distension   Community acquired pneumonia    Discharge Instructions: Discharge Instructions    Call MD for:  difficulty breathing, headache or visual disturbances   Complete by:  As directed    Call MD for:  extreme fatigue   Complete by:  As directed    Call MD for:  hives   Complete by:  As directed    Call MD for:  persistant dizziness or light-headedness   Complete by:  As directed    Call MD for:  persistant nausea and vomiting   Complete by:  As directed    Call MD for:  severe uncontrolled pain   Complete by:  As directed    Diet - low sodium heart healthy   Complete by:  As directed    Increase activity slowly   Complete by:  As directed      Allergies as of 02/04/2018   No Known Allergies     Medication List    TAKE these medications   doxycycline 100 MG capsule Commonly known as:  VIBRAMYCIN Take 1 capsule (100 mg total) by mouth 2 (two) times daily for 5 days.   folic acid 1 MG tablet Commonly known as:  FOLVITE Take 1 tablet (1 mg total) by mouth daily.   furosemide 40 MG tablet Commonly known as:  LASIX Take 1 tablet (40 mg total) by mouth 2 (two) times daily.   Magnesium Oxide 250 MG Tabs Take 1 tablet (250 mg total) by mouth 2 (two) times daily.    multivitamin with minerals Tabs tablet Take 1 tablet by mouth daily.   omeprazole 20 MG capsule Commonly known as:  PRILOSEC Take 20 mg by mouth 2 (two) times daily before a meal.   pantoprazole 40 MG tablet Commonly known as:  PROTONIX Take 1 tablet (40 mg total) by mouth 2 (two) times daily before a meal.   propranolol 20 MG tablet Commonly known as:  INDERAL Take 1 tablet (20 mg total) by mouth 2 (two) times daily.   spironolactone 100 MG tablet Commonly known as:  ALDACTONE Take 1 tablet (100 mg total) by mouth 2 (two) times daily before a meal.   thiamine 100 MG tablet Take 1 tablet (100 mg total) by mouth daily.      Follow-up Information    Doreatha Massed, MD. Schedule an appointment as soon as possible for a visit in 2 week(s).   Specialty:  Hematology  Why:  Hospital Follow Up for Anemia Contact information: [address] [place] [address] [phone]        Tiffany Kocher, PA-C. Schedule an appointment as soon as possible for a visit in 6 week(s).   Specialty:  Gastroenterology Why:  Hospital Follow Up  Contact information: [address] [place] [address] [phone]          No Known Allergies Allergies as of 02/04/2018   No Known Allergies     Medication List    TAKE these medications   doxycycline 100 MG capsule Commonly known as:  VIBRAMYCIN Take 1 capsule (100 mg total) by mouth 2 (two) times daily for 5 days.   folic acid 1 MG tablet Commonly known as:  FOLVITE Take 1 tablet (1 mg total) by mouth daily.   furosemide 40 MG tablet Commonly known as:  LASIX Take 1 tablet (40 mg total) by mouth 2 (two) times daily.   Magnesium Oxide 250 MG Tabs Take 1 tablet (250 mg total) by mouth 2 (two) times daily.   multivitamin with minerals Tabs tablet Take 1 tablet by mouth daily.   omeprazole 20 MG capsule Commonly known as:  PRILOSEC Take 20 mg by mouth 2 (two) times daily before a  meal.   pantoprazole 40 MG tablet Commonly known as:  PROTONIX Take 1 tablet (40 mg total) by mouth 2 (two) times daily before a meal.   propranolol 20 MG tablet Commonly known as:  INDERAL Take 1 tablet (20 mg total) by mouth 2 (two) times daily.   spironolactone 100 MG tablet Commonly known as:  ALDACTONE Take 1 tablet (100 mg total) by mouth 2 (two) times daily before a meal.   thiamine 100 MG tablet Take 1 tablet (100 mg total) by mouth daily.       Procedures/Studies: Dg Chest 2 View  Result Date: 01/31/2018 CLINICAL DATA:  Abdominal swelling EXAM: CHEST - 2 VIEW COMPARISON:  None. FINDINGS: Cardiac shadow is within normal limits. The lungs are well aerated bilaterally. Left lower lobe infiltrate with associated effusion is seen. IMPRESSION: Left lower lobe pneumonia Electronically Signed   By: Alcide Clever M.D.   On: 01/31/2018 13:45   US Abdomen Complete  Result Date: 01/31/2018 CLINICAL DATA:  37 year old male with abdominal swelling, history of alcoholic cirrhosis and ascites EXAM: ABDOMEN ULTRASOUND COMPLETE COMPARISON:  None. FINDINGS: Gallbladder: No gallstones or wall thickening visualized. No sonographic Murphy sign noted by sonographer. Common bile duct: Diameter: Normal at 3 mm Liver: The hepatic contour appears mildly nodular. There is relatively increased echogenicity and coarsening of the echotexture. No discrete mass or lesion identified. Portal vein is patent on color Doppler imaging with normal direction of blood flow towards the liver. IVC: No abnormality visualized. Pancreas: Visualized portion unremarkable. Spleen: Marked splenomegaly. The spleen measures up to 18.7 cm with a volume of 1302 cubic cm (top-normal is 411 cubic cm). Right Kidney: Length: 12.1 cm. Echogenicity within normal limits. No mass or hydronephrosis visualized. Left Kidney: Length: 12.0 cm. Echogenicity within normal limits. No mass or hydronephrosis visualized. Abdominal aorta: No aneurysm  visualized. Other findings: Trace ascites. IMPRESSION: 1. Hepatic cirrhosis with marked splenomegaly suggesting underlying portal hypertension. 2. Only trace perihepatic ascites, insufficient for paracentesis. 3. No discrete hepatic lesion identified. Electronically Signed   By: Malachy Moan M.D.   On: 01/31/2018 13:42   US Paracentesis  Result Date: 02/03/2018 INDICATION: Alcoholic cirrhosis EXAM: ULTRASOUND GUIDED THERAPEUTIC  PARACENTESIS MEDICATIONS: None. COMPLICATIONS: None immediate. PROCEDURE: Procedure, benefits, and risks of procedure were discussed with patient. Written informed consent for procedure was obtained. Time out protocol followed. Adequate collection of ascites localized by ultrasound in RIGHT lower quadrant. Skin prepped and draped in usual sterile fashion. Skin and soft tissues anesthetized with 10 mL of 1% lidocaine. 5 [place] Yueh catheter placed into peritoneal cavity. 4.8 L of YELLOW ascitic fluid fluid aspirated by vacuum bottle suction. Procedure tolerated well by patient without immediate complication. FINDINGS: As above IMPRESSION: Successful ultrasound-guided paracentesis yielding 4.8 liters of peritoneal fluid. Electronically Signed   By: Ulyses Southward M.D.   On: 02/03/2018 12:37   US Paracentesis  Result Date: 02/01/2018 INDICATION: Alcoholic cirrhosis, ascites EXAM: ULTRASOUND GUIDED DIAGNOSTIC AND THERAPEUTIC PARACENTESIS MEDICATIONS: None. COMPLICATIONS: None immediate. PROCEDURE: Procedure, benefits, and risks of procedure were discussed with patient. Written informed consent for procedure was obtained. Time out protocol followed. Adequate collection of ascites localized by ultrasound in RIGHT lower quadrant. Skin prepped and draped in usual sterile fashion. Skin and soft tissues anesthetized with 10 mL of 1% lidocaine. 5 French 10 cm length Yueh catheter placed into peritoneal cavity. 5.18 L of yellow ascitic fluid aspirated by vacuum bottle suction. Procedure tolerated  well by patient without immediate complication. FINDINGS: A total of approximately 5.18 L of ascitic fluid was removed. Samples were sent to the laboratory as requested by the clinical team. IMPRESSION: Successful ultrasound-guided paracentesis yielding 5.18 liters of peritoneal fluid. Electronically Signed   By: Ulyses Southward M.D.   On: 02/01/2018 11:06     Subjective: Pt says he feels much better and wants to go home today.   Discharge Exam: Vitals:   02/04/18 1105 02/04/18 1328  BP: (!) 110/57 117/60  Pulse: 60 65  Resp: 18 16  Temp: 98.3 F (36.8 C) 97.8 F (36.6 C)  SpO2: 100% 100%   Vitals:   02/03/18 2306 02/04/18 1040 02/04/18 1105 02/04/18 1328  BP: (!) 105/58 (!) 109/52 (!) 110/57 117/60  Pulse: 76 66 60 65  Resp: 20 16 18 16   Temp: 98 F (36.7 C) 99.9 F (37.7 C) 98.3 F (36.8 C) 97.8 F (36.6 C)  TempSrc: Oral Oral Oral Oral  SpO2: 99% 100% 100% 100%  Weight:      Height:       General exam: chronically ill appearing, no scleral icterus, NAD. Cooperative.  Respiratory system: BBS shallow but clear. No increased work of breathing. Cardiovascular system: S1 & S2 heard.  Bilateral LE edema. Gastrointestinal system: Abdomen is very distended, soft and nontender. Normal bowel sounds heard. Central nervous system: Alert and oriented. No focal neurological deficits. Extremities: 1+ edema bilateral LEs.   The results of significant diagnostics from this hospitalization (including imaging, microbiology, ancillary and laboratory) are listed below for reference.     Microbiology: Recent Results (from the past 240 hour(s))  Culture, blood (Routine X 2) w Reflex to ID Panel     Status: None (Preliminary result)   Collection Time: 01/31/18  3:06 PM  Result Value Ref Range Status   Specimen Description LEFT ANTECUBITAL  Final   Special Requests   Final    BOTTLES DRAWN AEROBIC AND ANAEROBIC Blood Culture adequate volume   Culture   Final    NO GROWTH 3 DAYS Performed at  [facility], [address], [place], [place] [address]    Report Status PENDING  Incomplete  Culture, blood (Routine X 2) w Reflex to ID Panel  Status: None (Preliminary result)   Collection Time: 01/31/18  3:06 PM  Result Value Ref Range Status   Specimen Description RIGHT ANTECUBITAL  Final   Special Requests   Final    BOTTLES DRAWN AEROBIC AND ANAEROBIC Blood Culture adequate volume   Culture  Setup Time   Final    GRAM POSITIVE RODS ANAEROBE BOTTLE ONLY Gram Stain Report Called to,Read Back By and Verified With: MDISHMON AT 1416 BY HFLYNT 02/04/18    Culture   Final    NO GROWTH 3 DAYS Performed at [facility], [address] [place], [place] [address]    Report Status PENDING  Incomplete  Culture, body fluid-bottle     Status: None (Preliminary result)   Collection Time: 02/01/18  9:42 AM  Result Value Ref Range Status   Specimen Description ASCITIC  Final   Special Requests BOTTLES DRAWN AEROBIC AND ANAEROBIC 10 CC EACH  Final   Culture   Final    NO GROWTH 2 DAYS Performed at [facility], [address], [place], [place] [address]    Report Status PENDING  Incomplete  Gram stain     Status: None   Collection Time: 02/01/18  9:42 AM  Result Value Ref Range Status   Specimen Description ASCITIC  Final   Special Requests NONE  Final   Gram Stain   Final    NO ORGANISMS SEEN WBC PRESENT, PREDOMINANTLY MONONUCLEAR CYTOSPIN SMEAR Performed at [facility] [place] [facility], [address], [place], [place] [address]    Report Status 02/01/2018 FINAL  Final  Acid Fast Smear (AFB)     Status: None   Collection Time: 02/01/18 10:08 AM  Result Value Ref Range Status   AFB Specimen Processing Concentration  Final   Acid Fast Smear Negative  Final    Comment: (NOTE) Performed At: [facility] [address] [place], [place] [id number] Jolene Schimke MD OZ:[id number]    Source (AFB) ASCITIC  Final    Comment: Performed at [facility], [address], [place], [place]  [address]     Labs: BNP (last 3 results) No results for input(s): BNP in the last 8760 hours. Basic Metabolic Panel: Recent Labs  Lab 01/31/18 1140 01/31/18 1235 02/01/18 0454 02/02/18 0537 02/03/18 0539 02/04/18 0519  NA 135  --  136 135 135 135  K 3.3*  --  3.5 4.0 3.8 3.5  CL 101  --  103 103 101 102  CO2 26  --  26 25 27 26   GLUCOSE 95  --  82 85 104* 93  BUN 5*  --  5* 5* 5* 6  CREATININE 0.46*  --  0.48* 0.48* 0.50* 0.41*  CALCIUM 8.2*  --  8.1* 8.2* 8.4* 8.5*  MG  --  1.3* 1.6* 1.6* 1.5* 1.4*   Liver Function Tests: Recent Labs  Lab 01/31/18 1140 02/01/18 0454 02/02/18 0537 02/03/18 0539 02/04/18 0519  AST 74* 66* 67* 68* 67*  ALT 22 19 21 22 22   ALKPHOS 150* 129* 123 111 98  BILITOT 3.3* 3.3* 2.8* 2.4* 2.1*  PROT 7.0 6.4* 6.3* 6.3* 6.2*  ALBUMIN 2.7* 2.5* 2.4* 2.4* 2.5*   Recent Labs  Lab 01/31/18 1140  LIPASE 38   Recent Labs  Lab 02/01/18 0454 02/02/18 0537 02/03/18 0539  AMMONIA 89* 79* 76*   CBC: Recent Labs  Lab 01/31/18 1140 01/31/18 1235 02/01/18 0454 02/02/18 0537 02/03/18 0539 02/04/18 0519  WBC 2.8*  --  3.6* 3.7* 3.3* 3.0*  NEUTROABS  --  1.1* 1.4* 1.5* 1.2* 0.9*  HGB 6.2*  --  7.0* 7.1* 7.2* 6.9*  HCT 22.6*  --  23.8* 24.6* 24.7* 24.1*  MCV 73.6*  --  75.1* 75.2* 75.8* 75.8*  PLT 85*  --  79* 71* 71* 72*   Cardiac Enzymes: No results for input(s): CKTOTAL, CKMB, CKMBINDEX, TROPONINI in the last 168 hours. BNP: Invalid input(s): POCBNP CBG: No results for input(s): GLUCAP in the last 168 hours. D-Dimer No results for input(s): DDIMER in the last 72 hours. Hgb A1c No results for input(s): HGBA1C in the last 72 hours. Lipid Profile No results for input(s): CHOL, HDL, LDLCALC, TRIG, CHOLHDL, LDLDIRECT in the last 72 hours. Thyroid function studies No results for input(s): TSH, T4TOTAL, T3FREE, THYROIDAB in the last 72 hours.  Invalid input(s): FREET3 Anemia work up No results for input(s): VITAMINB12, FOLATE, FERRITIN,  TIBC, IRON, RETICCTPCT in the last 72 hours. Urinalysis    Component Value Date/Time   COLORURINE AMBER (A) 01/31/2018 1054   APPEARANCEUR CLEAR 01/31/2018 1054   LABSPEC 1.019 01/31/2018 1054   PHURINE 6.0 01/31/2018 1054   GLUCOSEU NEGATIVE 01/31/2018 1054   HGBUR MODERATE (A) 01/31/2018 1054   BILIRUBINUR MODERATE (A) 01/31/2018 1054   KETONESUR NEGATIVE 01/31/2018 1054   PROTEINUR NEGATIVE 01/31/2018 1054   NITRITE NEGATIVE 01/31/2018 1054   LEUKOCYTESUR NEGATIVE 01/31/2018 1054   Sepsis Labs Invalid input(s): PROCALCITONIN,  WBC,  LACTICIDVEN Microbiology Recent Results (from the past 240 hour(s))  Culture, blood (Routine X 2) w Reflex to ID Panel     Status: None (Preliminary result)   Collection Time: 01/31/18  3:06 PM  Result Value Ref Range Status   Specimen Description LEFT ANTECUBITAL  Final   Special Requests   Final    BOTTLES DRAWN AEROBIC AND ANAEROBIC Blood Culture adequate volume   Culture   Final    NO GROWTH 3 DAYS Performed at [facility], [address], [place], [place] [address]    Report Status PENDING  Incomplete  Culture, blood (Routine X 2) w Reflex to ID Panel     Status: None (Preliminary result)   Collection Time: 01/31/18  3:06 PM  Result Value Ref Range Status   Specimen Description RIGHT ANTECUBITAL  Final   Special Requests   Final    BOTTLES DRAWN AEROBIC AND ANAEROBIC Blood Culture adequate volume   Culture  Setup Time   Final    GRAM POSITIVE RODS ANAEROBE BOTTLE ONLY Gram Stain Report Called to,Read Back By and Verified With: MDISHMON AT 1416 BY HFLYNT 02/04/18    Culture   Final    NO GROWTH 3 DAYS Performed at [facility], [address] [place], [place] [address]    Report Status PENDING  Incomplete  Culture, body fluid-bottle     Status: None (Preliminary result)   Collection Time: 02/01/18  9:42 AM  Result Value Ref Range Status   Specimen Description ASCITIC  Final   Special Requests BOTTLES DRAWN AEROBIC AND ANAEROBIC 10  CC EACH  Final   Culture   Final    NO GROWTH 2 DAYS Performed at [facility], [address] [place], [place] [address]    Report Status PENDING  Incomplete  Gram stain     Status: None   Collection Time: 02/01/18  9:42 AM  Result Value Ref Range Status   Specimen Description ASCITIC  Final   Special Requests NONE  Final   Gram Stain   Final    NO ORGANISMS SEEN  WBC PRESENT, PREDOMINANTLY MONONUCLEAR CYTOSPIN SMEAR Performed at [facility], [address], [place], [place] [address]    Report Status 02/01/2018 FINAL  Final  Acid Fast Smear (AFB)     Status: None   Collection Time: 02/01/18 10:08 AM  Result Value Ref Range Status   AFB Specimen Processing Concentration  Final   Acid Fast Smear Negative  Final    Comment: (NOTE) Performed At: [facility] [address] [place], [place] [id number] Jolene Schimke MD NW:[id number]    Source (AFB) ASCITIC  Final    Comment: Performed at [facility], [address] [place], [place] [address]    Time coordinating discharge: 34 minutes SIGNED:  Standley Dakins, MD  [facility] 02/04/2018, 2:22 PM Pager [phone]  If 7PM-7AM, please contact night-coverage [email or web address] Password [email or web address]

## 2018-02-04 NOTE — Care Management Note (Addendum)
Case Management Note  Patient Details  Name: Grant [name] MRN: [id number] Date of Birth: May 03, 1981    Expected Discharge Date:    02/04/2018              Expected Discharge Plan:  Home/Self Care  In-House Referral:     Discharge planning Services  CM Consult, [facility]  Post Acute Care Choice:  NA Choice offered to:  NA  DME Arranged:    DME Agency:     HH Arranged:    HH Agency:     Status of Service:  Completed, signed off  If discussed at [employer] of Stay Meetings, dates discussed:    Additional Comments: Patient discharging today. No insurance. MATCH provided, patient will use [facility] in [place] to fill prescriptions. He has an appt with [facility] in [place] to establish care. He would like to talk with Financial counselor again, left message with Jerene Dilling to contact patient.   Grant [name], Chrystine Oiler, RN 02/04/2018, 11:59 AM

## 2018-02-04 NOTE — Progress Notes (Signed)
PHARMACIST - PHYSICIAN COMMUNICATION DR:   TRH CONCERNING: Antibiotic IV to Oral Route Change Policy  RECOMMENDATION: This patient is receiving Azithromycin by the intravenous route.  Based on criteria approved by the Pharmacy and Therapeutics Committee, the antibiotic(s) is/are being converted to the equivalent oral dose form(s).   DESCRIPTION: These criteria include:  Patient being treated for a respiratory tract infection, urinary tract infection, cellulitis or clostridium difficile associated diarrhea if on metronidazole  The patient is not neutropenic and does not exhibit a GI malabsorption state  The patient is eating (either orally or via tube) and/or has been taking other orally administered medications for a least 24 hours  The patient is improving clinically and has a Tmax < 100.5  If you have questions about this conversion, please contact the Pharmacy Department    [phone] )  Jeani Hawking   [phone] )  [facility]   [phone] )  [facility]   [phone] )  [facility]   [phone] )  [facility]   Mady Gemma, [facility]  02/04/2018 8:23 AM

## 2018-02-05 LAB — TYPE AND SCREEN
ABO/RH(D): A POS
ANTIBODY SCREEN: NEGATIVE
Unit division: 0

## 2018-02-05 LAB — BPAM RBC
Blood Product Expiration Date: 201905302359
ISSUE DATE / TIME: 201905101046
Unit Type and Rh: 600

## 2018-02-05 LAB — BLOOD CULTURE ID PANEL (REFLEXED)
ACINETOBACTER BAUMANNII: NOT DETECTED
CANDIDA ALBICANS: NOT DETECTED
CANDIDA KRUSEI: NOT DETECTED
Candida glabrata: NOT DETECTED
Candida parapsilosis: NOT DETECTED
Candida tropicalis: NOT DETECTED
ENTEROBACTER CLOACAE COMPLEX: NOT DETECTED
ENTEROBACTERIACEAE SPECIES: NOT DETECTED
ENTEROCOCCUS SPECIES: NOT DETECTED
ESCHERICHIA COLI: NOT DETECTED
Haemophilus influenzae: NOT DETECTED
KLEBSIELLA OXYTOCA: NOT DETECTED
Klebsiella pneumoniae: NOT DETECTED
LISTERIA MONOCYTOGENES: NOT DETECTED
METHICILLIN RESISTANCE: NOT DETECTED
Neisseria meningitidis: NOT DETECTED
PSEUDOMONAS AERUGINOSA: NOT DETECTED
Proteus species: NOT DETECTED
SERRATIA MARCESCENS: NOT DETECTED
STREPTOCOCCUS AGALACTIAE: NOT DETECTED
STREPTOCOCCUS PNEUMONIAE: NOT DETECTED
STREPTOCOCCUS PYOGENES: NOT DETECTED
Staphylococcus aureus (BCID): NOT DETECTED
Staphylococcus species: NOT DETECTED
Streptococcus species: NOT DETECTED

## 2018-02-06 LAB — CULTURE, BODY FLUID W GRAM STAIN -BOTTLE: Culture: NO GROWTH

## 2018-02-06 LAB — CULTURE, BLOOD (ROUTINE X 2): SPECIAL REQUESTS: ADEQUATE

## 2018-02-08 LAB — CULTURE, BLOOD (ROUTINE X 2): Special Requests: ADEQUATE

## 2018-03-09 ENCOUNTER — Ambulatory Visit (INDEPENDENT_AMBULATORY_CARE_PROVIDER_SITE_OTHER): Payer: Self-pay | Admitting: Gastroenterology

## 2018-03-09 ENCOUNTER — Inpatient Hospital Stay (HOSPITAL_COMMUNITY): Payer: Self-pay | Attending: Hematology | Admitting: Hematology

## 2018-03-09 ENCOUNTER — Encounter: Payer: Self-pay | Admitting: Gastroenterology

## 2018-03-09 ENCOUNTER — Inpatient Hospital Stay (HOSPITAL_COMMUNITY): Payer: Self-pay

## 2018-03-09 ENCOUNTER — Encounter (HOSPITAL_COMMUNITY): Payer: Self-pay | Admitting: Hematology

## 2018-03-09 ENCOUNTER — Other Ambulatory Visit (HOSPITAL_COMMUNITY)
Admission: RE | Admit: 2018-03-09 | Discharge: 2018-03-09 | Disposition: A | Discharge status: Home / Self Care | Payer: Self-pay | Source: Ambulatory Visit | Attending: Gastroenterology | Admitting: Gastroenterology

## 2018-03-09 VITALS — BP 163/81 | HR 61 | Temp 97.1°F | Ht 70.0 in | Wt 203.0 lb

## 2018-03-09 VITALS — BP 155/91 | HR 63 | Temp 97.8°F | Resp 18 | Wt 203.0 lb

## 2018-03-09 DIAGNOSIS — K7030 Alcoholic cirrhosis of liver without ascites: Secondary | ICD-10-CM

## 2018-03-09 DIAGNOSIS — K7460 Unspecified cirrhosis of liver: Secondary | ICD-10-CM | POA: Insufficient documentation

## 2018-03-09 DIAGNOSIS — K766 Portal hypertension: Secondary | ICD-10-CM | POA: Insufficient documentation

## 2018-03-09 DIAGNOSIS — D649 Anemia, unspecified: Secondary | ICD-10-CM

## 2018-03-09 DIAGNOSIS — R161 Splenomegaly, not elsewhere classified: Secondary | ICD-10-CM | POA: Insufficient documentation

## 2018-03-09 DIAGNOSIS — D500 Iron deficiency anemia secondary to blood loss (chronic): Secondary | ICD-10-CM | POA: Insufficient documentation

## 2018-03-09 LAB — CBC WITH DIFFERENTIAL/PLATELET
Basophils Absolute: 0.1 10*3/uL (ref 0.0–0.1)
Basophils Relative: 1 %
EOS PCT: 3 %
Eosinophils Absolute: 0.1 10*3/uL (ref 0.0–0.7)
HEMATOCRIT: 37.3 % — AB (ref 39.0–52.0)
Hemoglobin: 11.6 g/dL — ABNORMAL LOW (ref 13.0–17.0)
LYMPHS ABS: 0.8 10*3/uL (ref 0.7–4.0)
LYMPHS PCT: 21 %
MCH: 27.4 pg (ref 26.0–34.0)
MCHC: 31.1 g/dL (ref 30.0–36.0)
MCV: 88 fL (ref 78.0–100.0)
MONOS PCT: 18 %
Monocytes Absolute: 0.7 10*3/uL (ref 0.1–1.0)
Neutro Abs: 2.2 10*3/uL (ref 1.7–7.7)
Neutrophils Relative %: 57 %
PLATELETS: 80 10*3/uL — AB (ref 150–400)
RBC: 4.24 MIL/uL (ref 4.22–5.81)
RDW: 24.8 % — AB (ref 11.5–15.5)
WBC: 3.9 10*3/uL — ABNORMAL LOW (ref 4.0–10.5)

## 2018-03-09 LAB — PROTIME-INR
INR: 1.35
PROTHROMBIN TIME: 16.6 s — AB (ref 11.4–15.2)

## 2018-03-09 LAB — COMPREHENSIVE METABOLIC PANEL
ALT: 43 U/L (ref 17–63)
AST: 98 U/L — AB (ref 15–41)
Albumin: 3.5 g/dL (ref 3.5–5.0)
Alkaline Phosphatase: 142 U/L — ABNORMAL HIGH (ref 38–126)
Anion gap: 10 (ref 5–15)
BILIRUBIN TOTAL: 3.5 mg/dL — AB (ref 0.3–1.2)
BUN: 7 mg/dL (ref 6–20)
CHLORIDE: 99 mmol/L — AB (ref 101–111)
CO2: 28 mmol/L (ref 22–32)
CREATININE: 0.44 mg/dL — AB (ref 0.61–1.24)
Calcium: 9.2 mg/dL (ref 8.9–10.3)
Glucose, Bld: 91 mg/dL (ref 65–99)
Potassium: 3.6 mmol/L (ref 3.5–5.1)
Sodium: 137 mmol/L (ref 135–145)
TOTAL PROTEIN: 8.4 g/dL — AB (ref 6.5–8.1)

## 2018-03-09 LAB — IRON AND TIBC
Iron: 116 ug/dL (ref 45–182)
Saturation Ratios: 28 % (ref 17.9–39.5)
TIBC: 416 ug/dL (ref 250–450)
UIBC: 300 ug/dL

## 2018-03-09 LAB — FERRITIN: Ferritin: 32 ng/mL (ref 24–336)

## 2018-03-09 MED ORDER — SPIRONOLACTONE 100 MG PO TABS: 100.0000 mg | ORAL_TABLET | Freq: Every day | ORAL | 0 refills | Status: DC

## 2018-03-09 MED ORDER — FUROSEMIDE 40 MG PO TABS: 40.0000 mg | ORAL_TABLET | Freq: Every day | ORAL | 5 refills | Status: DC

## 2018-03-09 MED ORDER — PROPRANOLOL HCL 20 MG PO TABS
20.0000 mg | ORAL_TABLET | Freq: Two times a day (BID) | ORAL | 2 refills | Status: DC
Start: 2018-03-09 — End: 2018-06-13

## 2018-03-09 MED ORDER — OMEPRAZOLE 20 MG PO CPDR: 20.0000 mg | DELAYED_RELEASE_CAPSULE | Freq: Two times a day (BID) | ORAL | 5 refills | Status: DC

## 2018-03-09 NOTE — Progress Notes (Signed)
CONSULT NOTE  Patient Care Team: Patient, No Pcp Per as PCP - General (General Practice) Fields, Marga Melnick, MD as Consulting Physician (Gastroenterology)  CHIEF COMPLAINTS/PURPOSE OF CONSULTATION:  Severe microcytic anemia.  HISTORY OF PRESENTING ILLNESS:  Grant [name] 37 y.o. male is seen in our office for consultation for severe microcytic anemia.  He was admitted to the hospital on 01/31/2018 and was found to have a low hemoglobin of 6.2.  He has a history of cirrhosis and splenomegaly.  Denied any bleeding per rectum or melena.  He received 3 units of blood while in the hospital and was discharged on 02/04/2018.  He also received Feraheme infusion on 02/03/2018 and did not have any problems.  His ferritin during hospitalization on 02/01/2018 was 11.  His last CBC on 02/04/2018 shows hemoglobin of 6.9.  He also had abdominal paracentesis done and about 5 L of fluid was removed.  During that hospitalization, hemolysis was ruled out with normal Coombs test, and LDH level.  He denies any chest pains or presyncopal episodes.  Denies any shortness of breath.  Lives at home with his father.  Able to do all his ADLs and IADLs. Denies any fevers, night sweats or weight loss.  He was evaluated by GI clinic this morning.  EGD and colonoscopy were recommended.  Patient is putting them off until he gets financial assistance.  Denies any rectal bleeding since discharge from the hospital.  MEDICAL HISTORY:  Past Medical History:  Diagnosis Date  . Ascites 2013  . Campylobacter diarrhea 04/2016   bloody diarrhea.   . Cirrhosis ([place]) 2013   Due to ETOH and ? obesity related fatty liver disease. Hepatitis B and C serologies negative 06/2016.     Marland Kitchen Hypomagnesemia 01/31/2018  . Splenomegaly 2013  . Thrombocytopenia ([place]) 2013    SURGICAL HISTORY: Past Surgical History:  Procedure Laterality Date  . ESOPHAGOGASTRODUODENOSCOPY  08/2015   [place], Dr. Britta Mccreedy   . PARACENTESIS  2013   In [place] at [facility]  associated hospital.     SOCIAL HISTORY: Social History   Socioeconomic History  . Marital status: Single    Spouse name: Not on file  . Number of children: Not on file  . Years of education: 2 to 3 yrs of colleg  . Highest education level: Not on file  Occupational History  . 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 Financial resource strain: Not on file  . Food insecurity:    Worry: Not on file    Inability: Not on file  . Transportation needs:    Medical: Not on file    Non-medical: Not on file  Tobacco Use  . Smoking status: Never Smoker  . Smokeless tobacco: Never Used  Substance and Sexual Activity  . Alcohol use: Yes    Alcohol/week: 12.0 oz    Types: 20 Cans of beer per week    Comment: history of ETOH abuse, sober for one month  . Drug use: No  . Sexual activity: Not on file  Lifestyle  . Physical activity:    Days per week: Not on file    Minutes per session: Not on file  . Stress: Not on file  Relationships  . Social connections:    Talks on phone: Not on file    Gets together: Not on file    Attends religious service: Not on file    Active member of club or organization:  Not on file    Attends meetings of clubs or organizations: Not on file    Relationship status: Not on file  . Intimate partner violence:    Fear of current or ex partner: Not on file    Emotionally abused: Not on file    Physically abused: Not on file    Forced sexual activity: Not on file  Other Topics Concern  . Not on file  Social History Narrative   Had nearly 1 year of college credit gained in high school AP classes.  Stayed in college for about 2 years, has about 3 yrs of college credits.     FAMILY HISTORY: Family History  Problem Relation Age of Onset  . Diabetes Father   . Hypertension Father   . Colon polyps Father 35  . Diabetes Other   . Cancer Neg Hx   . Colon cancer Neg Hx   . Liver disease Neg Hx     ALLERGIES:   has No Known Allergies.  MEDICATIONS:  Current Outpatient Medications  Medication Sig Dispense Refill  . folic acid (FOLVITE) 1 MG tablet Take 1 tablet (1 mg total) by mouth daily. 30 tablet 1  . furosemide (LASIX) 40 MG tablet Take 1 tablet (40 mg total) by mouth daily. 30 tablet 5  . Magnesium 250 MG TABS Take by mouth daily.    . Multiple Vitamin (MULTIVITAMIN WITH MINERALS) TABS tablet Take 1 tablet by mouth daily.    . Multiple Vitamin (MULTIVITAMIN) tablet Take 1 tablet by mouth daily.    Marland Kitchen omeprazole (PRILOSEC) 20 MG capsule Take 1 capsule (20 mg total) by mouth 2 (two) times daily before a meal. 60 capsule 5  . propranolol (INDERAL) 20 MG tablet Take 1 tablet (20 mg total) by mouth 2 (two) times daily. 60 tablet 2  . spironolactone (ALDACTONE) 100 MG tablet Take 1 tablet (100 mg total) by mouth daily. 30 tablet 0  . thiamine 100 MG tablet Take 1 tablet (100 mg total) by mouth daily. 30 tablet 1   No current facility-administered medications for this visit.     REVIEW OF SYSTEMS:   Constitutional: Denies fevers, chills or abnormal night sweats.  Mild fatigue present. Eyes: Denies blurriness of vision, double vision or watery eyes Ears, nose, mouth, throat, and face: Denies mucositis or sore throat Respiratory: Denies cough, dyspnea or wheezes Cardiovascular: Denies palpitation, chest discomfort or lower extremity swelling Gastrointestinal:  Denies nausea, heartburn or change in bowel habits Skin: Denies abnormal skin rashes Lymphatics: Denies new lymphadenopathy or easy bruising Neurological:Denies numbness, tingling or new weaknesses Behavioral/Psych: Mood is stable, no new changes  All other systems were reviewed with the patient and are negative.  PHYSICAL EXAMINATION: ECOG PERFORMANCE STATUS: 1 - Symptomatic but completely ambulatory  Vitals:   03/09/18 1200  BP: (!) 155/91  Pulse: 63  Resp: 18  Temp: 97.8 F (36.6 C)  SpO2: 100%   Filed Weights   03/09/18 1200   Weight: 203 lb (92.1 kg)    GENERAL:alert, no distress and comfortable SKIN: skin color, texture, turgor are normal, no rashes or significant lesions EYES: normal, conjunctiva are pink and non-injected, sclera clear OROPHARYNX:no exudate, no erythema and lips, buccal mucosa, and tongue normal  NECK: supple, thyroid normal size, non-tender, without nodularity LYMPH:  no palpable lymphadenopathy in the cervical, axillary or inguinal LUNGS: clear to auscultation and percussion with normal breathing effort HEART: regular rate & rhythm and no murmurs and no lower extremity edema ABDOMEN:abdomen soft,  non-tender and normal bowel sounds.  Palpable hepatomegaly and splenomegaly about 2 to 4 fingerbreadths below the costal margin on deep inspiration. PSYCH: alert & oriented x 3 with fluent speech   LABORATORY DATA:  I have reviewed the data as listed Recent Results (from the past 2160 hour(s))  Urinalysis, Routine w reflex microscopic     Status: Abnormal   Collection Time: 01/31/18 10:54 AM  Result Value Ref Range   Color, Urine AMBER (A) YELLOW    Comment: BIOCHEMICALS MAY BE AFFECTED BY COLOR   APPearance CLEAR CLEAR   Specific Gravity, Urine 1.019 1.005 - 1.030   pH 6.0 5.0 - 8.0   Glucose, UA NEGATIVE NEGATIVE mg/dL   Hgb urine dipstick MODERATE (A) NEGATIVE   Bilirubin Urine MODERATE (A) NEGATIVE   Ketones, ur NEGATIVE NEGATIVE mg/dL   Protein, ur NEGATIVE NEGATIVE mg/dL   Nitrite NEGATIVE NEGATIVE   Leukocytes, UA NEGATIVE NEGATIVE   RBC / HPF 21-50 0 - 5 RBC/hpf   WBC, UA 0-5 0 - 5 WBC/hpf   Bacteria, UA NONE SEEN NONE SEEN   Squamous Epithelial / LPF 0-5 0 - 5    Comment: Please note change in reference range.   Mucus PRESENT    Uric Acid Crys, UA PRESENT     Comment: Performed at [facility], [address], [place], [place] [address]  Lipase, blood     Status: None   Collection Time: 01/31/18 11:40 AM  Result Value Ref Range   Lipase 38 11 - 51 U/L    Comment:  Performed at [facility], [address] [place], [place] [address]  Comprehensive metabolic panel     Status: Abnormal   Collection Time: 01/31/18 11:40 AM  Result Value Ref Range   Sodium 135 135 - 145 mmol/L   Potassium 3.3 (L) 3.5 - 5.1 mmol/L   Chloride 101 101 - 111 mmol/L   CO2 26 22 - 32 mmol/L   Glucose, Bld 95 65 - 99 mg/dL   BUN 5 (L) 6 - 20 mg/dL   Creatinine, Ser 0.46 (L) 0.61 - 1.24 mg/dL   Calcium 8.2 (L) 8.9 - 10.3 mg/dL   Total Protein 7.0 6.5 - 8.1 g/dL   Albumin 2.7 (L) 3.5 - 5.0 g/dL   AST 74 (H) 15 - 41 U/L   ALT 22 17 - 63 U/L   Alkaline Phosphatase 150 (H) 38 - 126 U/L   Total Bilirubin 3.3 (H) 0.3 - 1.2 mg/dL   GFR calc non Af Amer >60 >60 mL/min   GFR calc Af Amer >60 >60 mL/min    Comment: (NOTE) The eGFR has been calculated using the CKD EPI equation. This calculation has not been validated in all clinical situations. eGFR's persistently <60 mL/min signify possible Chronic Kidney Disease.    Anion gap 8 5 - 15    Comment: Performed at [facility], [address], [place], [place] [address]  CBC     Status: Abnormal   Collection Time: 01/31/18 11:40 AM  Result Value Ref Range   WBC 2.8 (L) 4.0 - 10.5 K/uL   RBC 3.07 (L) 4.22 - 5.81 MIL/uL   Hemoglobin 6.2 (LL) 13.0 - 17.0 g/dL    Comment: CRITICAL RESULT CALLED TO, READ BACK BY AND VERIFIED WITH: HYATT,K_0  BY MATTHEWS, B 5.6.19    HCT 22.6 (L) 39.0 - 52.0 %   MCV 73.6 (L) 78.0 - 100.0 fL   MCH 20.2 (L) 26.0 - 34.0 pg   MCHC  27.4 (L) 30.0 - 36.0 g/dL   RDW 21.7 (H) 11.5 - 15.5 %   Platelets 85 (L) 150 - 400 K/uL    Comment: SPECIMEN CHECKED FOR CLOTS PLATELET COUNT CONFIRMED BY SMEAR Performed at [facility], [address], [place], [place] [address]   Protime-INR     Status: Abnormal   Collection Time: 01/31/18 12:24 PM  Result Value Ref Range   Prothrombin Time 17.8 (H) 11.4 - 15.2 seconds   INR 1.48     Comment: Performed at [facility], [address] [place], [place] [address]   Type and screen     Status: None   Collection Time: 01/31/18 12:34 PM  Result Value Ref Range   ABO/RH(D) A POS    Antibody Screen NEG    Sample Expiration 02/03/2018    Unit Number [id number]    Blood Component Type RED CELLS,LR    Unit division 00    Status of Unit ISSUED,FINAL    Transfusion Status OK TO TRANSFUSE    Crossmatch Result      Compatible Performed at [facility], [address], [place], [place] [address]    Unit Number [id number]    Blood Component Type RED CELLS,LR    Unit division 00    Status of Unit ISSUED,FINAL    Transfusion Status OK TO TRANSFUSE    Crossmatch Result Compatible   Ethanol     Status: None   Collection Time: 01/31/18 12:34 PM  Result Value Ref Range   Alcohol, Ethyl (B) <10 <10 mg/dL    Comment:        LOWEST DETECTABLE LIMIT FOR SERUM ALCOHOL IS 10 mg/dL FOR MEDICAL PURPOSES ONLY Performed at [facility], [address], [place], [place] [address]   BPAM RBC     Status: None   Collection Time: 01/31/18 12:34 PM  Result Value Ref Range   ISSUE DATE / TIME [id number]    Blood Product Unit Number [id number]    PRODUCT CODE E0382V00    Unit Type and Rh 6200    Blood Product Expiration Date 867672094709    ISSUE DATE / TIME [id number]    Blood Product Unit Number [id number]    PRODUCT CODE E0382V00    Unit Type and Rh 6200    Blood Product Expiration Date 751700174944   Differential     Status: Abnormal   Collection Time: 01/31/18 12:35 PM  Result Value Ref Range   Neutro Abs 1.1 (L) 1.7 - 7.7 K/uL   Lymphs Abs 0.8 0.7 - 4.0 K/uL   Monocytes Absolute 0.6 0.1 - 1.0 K/uL   Eosinophils Absolute 0.2 0.0 - 0.7 K/uL   Basophils Absolute 0.1 0.0 - 0.1 K/uL   Neutrophils Relative % 39 %   Lymphocytes Relative 30 %   Monocytes Relative 22 %   Eosinophils Relative 7 %   Basophils Relative 2 %   Band Neutrophils 0 %   Metamyelocytes Relative 0 %   Myelocytes 0 %   Promyelocytes Relative 0 %   Blasts 0 %   nRBC 0 0  /100 WBC   Other 0 %   RBC Morphology Schistocytes present     Comment: TARGET CELLS ELLIPTOCYTES ANISOCYTES Performed at [facility], [address], [place], [place] [address]   Magnesium     Status: Abnormal   Collection Time: 01/31/18 12:35 PM  Result Value Ref Range   Magnesium 1.3 (L) 1.7 - 2.4 mg/dL    Comment: Performed at [facility], [address], [place], [place] [address]  ABO/Rh     Status: None   Collection Time: 01/31/18 12:37 PM  Result Value Ref Range   ABO/RH(D)      A POS Performed at [facility], [address], [place], [place] [address]   Direct antiglobulin test (not at [facility])     Status: None   Collection Time: 01/31/18 12:37 PM  Result Value Ref Range   DAT, complement      NEG Performed at [place] [address] [place], [place] [address]    DAT, IgG      NEG Performed at [facility], [address] [place], [place] [address]   Prepare RBC     Status: None   Collection Time: 01/31/18 12:45 PM  Result Value Ref Range   Order Confirmation      ORDER PROCESSED BY BLOOD BANK Performed at [facility], [address] [place], [place] [address]   Culture, blood (Routine X 2) w Reflex to ID Panel     Status: Abnormal   Collection Time: 01/31/18  3:06 PM  Result Value Ref Range   Specimen Description      LEFT ANTECUBITAL Performed at [facility], [address], [place], [place] [address]    Special Requests      BOTTLES DRAWN AEROBIC AND ANAEROBIC Blood Culture adequate volume Performed at [place] [place], [place] [address]    Culture  Setup Time      GRAM POSITIVE RODS Gram Stain Report Called to,Read Back By and Verified With: DISHMON,M_0  BY MATTHEWS, B 5.11.19 ANAEROBIC BOTTLE [facility] Performed at [facility], [address], [place], [place] [address]    Culture PROPIONIBACTERIUM ACNES (A)    Report Status 02/08/2018 FINAL   Culture, blood (Routine X 2) w Reflex to ID Panel     Status:  Abnormal   Collection Time: 01/31/18  3:06 PM  Result Value Ref Range   Specimen Description      RIGHT ANTECUBITAL Performed at [facility], [address] [place], [place] [address]    Special Requests      BOTTLES DRAWN AEROBIC AND ANAEROBIC Blood Culture adequate volume Performed at [place] [place], [place] [address]    Culture  Setup Time      GRAM POSITIVE RODS ANAEROBIC BOTTLE ONLY Gram Stain Report Called to,Read Back By and Verified With: MDISHMON AT 7948 BY HFLYNT 02/04/18    Culture PROPIONIBACTERIUM ACNES (A)    Report Status 02/06/2018 FINAL   Blood Culture ID Panel (Reflexed)     Status: None   Collection Time: 01/31/18  3:06 PM  Result Value Ref Range   Enterococcus species NOT DETECTED NOT DETECTED   Listeria monocytogenes NOT DETECTED NOT DETECTED   Staphylococcus species NOT DETECTED NOT DETECTED   Staphylococcus aureus NOT DETECTED NOT DETECTED   Methicillin resistance NOT DETECTED NOT DETECTED   Streptococcus species NOT DETECTED NOT DETECTED   Streptococcus agalactiae NOT DETECTED NOT DETECTED   Streptococcus pneumoniae NOT DETECTED NOT DETECTED   Streptococcus pyogenes NOT DETECTED NOT DETECTED   Acinetobacter baumannii NOT DETECTED NOT DETECTED   Enterobacteriaceae species NOT DETECTED NOT DETECTED   Enterobacter cloacae complex NOT DETECTED NOT DETECTED   Escherichia coli NOT DETECTED NOT DETECTED   Klebsiella oxytoca NOT DETECTED NOT DETECTED   Klebsiella pneumoniae NOT DETECTED NOT DETECTED   Proteus species NOT DETECTED NOT DETECTED   Serratia  marcescens NOT DETECTED NOT DETECTED   Haemophilus influenzae NOT DETECTED NOT DETECTED   Neisseria meningitidis NOT DETECTED NOT DETECTED   Pseudomonas aeruginosa NOT DETECTED NOT DETECTED   Candida albicans NOT DETECTED NOT DETECTED   Candida glabrata NOT DETECTED NOT DETECTED   Candida krusei NOT DETECTED NOT DETECTED   Candida parapsilosis NOT DETECTED NOT DETECTED   Candida  tropicalis NOT DETECTED NOT DETECTED    Comment: Performed at [facility], [place] [address], [place], [place] [address]  Strep pneumoniae urinary antigen     Status: None   Collection Time: 01/31/18  4:04 PM  Result Value Ref Range   Strep Pneumo Urinary Antigen NEGATIVE NEGATIVE    Comment:        Infection due to S. pneumoniae cannot be absolutely ruled out since the antigen present may be below the detection limit of the test. PERFORMED AT [facility] Performed at [facility], [place] [address], [place], [place] [address]   TSH     Status: None   Collection Time: 01/31/18  4:04 PM  Result Value Ref Range   TSH 2.121 0.350 - 4.500 uIU/mL    Comment: Performed by a 3rd Generation assay with a functional sensitivity of <=0.01 uIU/mL. Performed at [facility], [address], [place], [place] [address]   Rapid urine drug screen (hospital performed)     Status: None   Collection Time: 01/31/18  4:04 PM  Result Value Ref Range   Opiates NONE DETECTED NONE DETECTED   Cocaine NONE DETECTED NONE DETECTED   Benzodiazepines NONE DETECTED NONE DETECTED   Amphetamines NONE DETECTED NONE DETECTED   Tetrahydrocannabinol NONE DETECTED NONE DETECTED   Barbiturates NONE DETECTED NONE DETECTED    Comment: (NOTE) DRUG SCREEN FOR MEDICAL PURPOSES ONLY.  IF CONFIRMATION IS NEEDED FOR ANY PURPOSE, NOTIFY LAB WITHIN 5 DAYS. LOWEST DETECTABLE LIMITS FOR URINE DRUG SCREEN Drug Class                     Cutoff (ng/mL) Amphetamine and metabolites    1000 Barbiturate and metabolites    200 Benzodiazepine                 580 Tricyclics and metabolites     300 Opiates and metabolites        300 Cocaine and metabolites        300 THC                            50 Performed at [facility], [address] [place],  [address]   Comprehensive metabolic panel     Status: Abnormal   Collection Time: 02/01/18  4:54 AM  Result Value Ref Range   Sodium 136 135 - 145 mmol/L    Potassium 3.5 3.5 - 5.1 mmol/L   Chloride 103 101 - 111 mmol/L   CO2 26 22 - 32 mmol/L   Glucose, Bld 82 65 - 99 mg/dL   BUN 5 (L) 6 - 20 mg/dL   Creatinine, Ser 0.48 (L) 0.61 - 1.24 mg/dL   Calcium 8.1 (L) 8.9 - 10.3 mg/dL   Total Protein 6.4 (L) 6.5 - 8.1 g/dL   Albumin 2.5 (L) 3.5 - 5.0 g/dL   AST 66 (H) 15 - 41 U/L   ALT 19 17 - 63 U/L   Alkaline Phosphatase 129 (H) 38 - 126 U/L   Total Bilirubin 3.3 (  H) 0.3 - 1.2 mg/dL   GFR calc non Af Amer >60 >60 mL/min   GFR calc Af Amer >60 >60 mL/min    Comment: (NOTE) The eGFR has been calculated using the CKD EPI equation. This calculation has not been validated in all clinical situations. eGFR's persistently <60 mL/min signify possible Chronic Kidney Disease.    Anion gap 7 5 - 15    Comment: Performed at [facility], [address] [place], [place] [address]  CBC WITH DIFFERENTIAL     Status: Abnormal   Collection Time: 02/01/18  4:54 AM  Result Value Ref Range   WBC 3.6 (L) 4.0 - 10.5 K/uL   RBC 3.17 (L) 4.22 - 5.81 MIL/uL   Hemoglobin 7.0 (L) 13.0 - 17.0 g/dL   HCT 23.8 (L) 39.0 - 52.0 %   MCV 75.1 (L) 78.0 - 100.0 fL   MCH 22.1 (L) 26.0 - 34.0 pg   MCHC 29.4 (L) 30.0 - 36.0 g/dL   RDW 21.0 (H) 11.5 - 15.5 %   Platelets 79 (L) 150 - 400 K/uL    Comment: SPECIMEN CHECKED FOR CLOTS PLATELET COUNT CONFIRMED BY SMEAR    Neutrophils Relative % 37 %   Neutro Abs 1.4 (L) 1.7 - 7.7 K/uL   Lymphocytes Relative 30 %   Lymphs Abs 1.1 0.7 - 4.0 K/uL   Monocytes Relative 24 %   Monocytes Absolute 0.8 0.1 - 1.0 K/uL   Eosinophils Relative 8 %   Eosinophils Absolute 0.3 0.0 - 0.7 K/uL   Basophils Relative 1 %   Basophils Absolute 0.0 0.0 - 0.1 K/uL    Comment: Performed at [facility], [address] [place], [place] [address]  Magnesium     Status: Abnormal   Collection Time: 02/01/18  4:54 AM  Result Value Ref Range   Magnesium 1.6 (L) 1.7 - 2.4 mg/dL    Comment: Performed at [facility], [address] [place], [place]  [address]  Ammonia     Status: Abnormal   Collection Time: 02/01/18  4:54 AM  Result Value Ref Range   Ammonia 89 (H) 9 - 35 umol/L    Comment: Performed at [facility], [address] [place], [place] [address]  Lipid panel     Status: Abnormal   Collection Time: 02/01/18  4:54 AM  Result Value Ref Range   Cholesterol 131 0 - 200 mg/dL   Triglycerides 78 <150 mg/dL   HDL 17 (L) >40 mg/dL   Total CHOL/HDL Ratio 7.7 RATIO   VLDL 16 0 - 40 mg/dL   LDL Cholesterol 98 0 - 99 mg/dL    Comment:        Total Cholesterol/HDL:CHD Risk Coronary Heart Disease Risk Table                     Men   Women  1/2 Average Risk   3.4   3.3  Average Risk       5.0   4.4  2 X Average Risk   9.6   7.1  3 X Average Risk  23.4   11.0        Use the calculated Patient Ratio above and the CHD Risk Table to determine the patient's CHD Risk.        ATP III CLASSIFICATION (LDL):  <100     mg/dL   Optimal  100-129  mg/dL   Near or Above  Optimal  130-159  mg/dL   Borderline  160-189  mg/dL   High  >190     mg/dL   Very High Performed at [facility], [address] [place], [place] [address]   Protime-INR     Status: Abnormal   Collection Time: 02/01/18  4:54 AM  Result Value Ref Range   Prothrombin Time 18.4 (H) 11.4 - 15.2 seconds   INR 1.54     Comment: Performed at [facility], [address], [place], [place] [address]  HIV antibody     Status: None   Collection Time: 02/01/18  4:54 AM  Result Value Ref Range   HIV Screen 4th Generation wRfx Non Reactive Non Reactive    Comment: (NOTE) Performed At: [facility] [place], [place] [id number] Rush Farmer MD [id number] Performed at [facility], [address], [place], [place] [address]   Vitamin B12     Status: Abnormal   Collection Time: 02/01/18  4:54 AM  Result Value Ref Range   Vitamin B-12 1,282 (H) 180 - 914 pg/mL    Comment: (NOTE) This assay is not validated for testing neonatal  or myeloproliferative syndrome specimens for Vitamin B12 levels. Performed at [facility], [place] [address], [place], [place] [address]   Iron and TIBC     Status: Abnormal   Collection Time: 02/01/18  4:54 AM  Result Value Ref Range   Iron 24 (L) 45 - 182 ug/dL   TIBC 335 250 - 450 ug/dL   Saturation Ratios 7 (L) 17.9 - 39.5 %   UIBC 311 ug/dL    Comment: Performed at [facility], [place] [address], [place], [place] [address]  Ferritin     Status: Abnormal   Collection Time: 02/01/18  4:54 AM  Result Value Ref Range   Ferritin 11 (L) 24 - 336 ng/mL    Comment: Performed at [facility], [place] [address], [place], [place] [address]  Folate     Status: None   Collection Time: 02/01/18  4:54 AM  Result Value Ref Range   Folate 29.0 >5.9 ng/mL    Comment: Performed at [place] [address], [place], [place] [address]  Lactate dehydrogenase     Status: None   Collection Time: 02/01/18  4:54 AM  Result Value Ref Range   LDH 120 98 - 192 U/L    Comment: Performed at [facility] [address] [place], [place] [address]  Reticulocytes     Status: Abnormal   Collection Time: 02/01/18  4:54 AM  Result Value Ref Range   Retic Ct Pct 1.9 0.4 - 3.1 %   RBC. 3.18 (L) 4.22 - 5.81 MIL/uL   Retic Count, Absolute 60.4 19.0 - 186.0 K/uL    Comment: Performed at [facility], [address] [place], [place] [address]  Lactate dehydrogenase (pleural or peritoneal fluid)     Status: Abnormal   Collection Time: 02/01/18  9:42 AM  Result Value Ref Range   LD, Fluid 24 (H) 3 - 23 U/L    Comment: (NOTE) Results should be evaluated in conjunction with serum values    Fluid Type-FLDH ASCITIC     Comment: Performed at [facility], [address], [place], [place] [address] CORRECTED ON 05/07 AT 1000: PREVIOUSLY REPORTED AS PERITONEAL CAVITY   Body fluid cell count with differential     Status: Abnormal   Collection Time: 02/01/18  9:42 AM  Result Value Ref Range    Fluid Type-FCT ASCITIC     Comment: CORRECTED ON 05/07 AT 1000: PREVIOUSLY REPORTED AS PERITONEAL CAVITY   Color, Fluid YELLOW YELLOW   Appearance, Fluid CLEAR CLEAR   WBC, Fluid 74 0 - 1,000 cu mm   Neutrophil Count, Fluid 4 0 - 25 %   Lymphs, Fluid 54 %   Monocyte-Macrophage-Serous Fluid 42 (L) 50 - 90 %   Eos, Fluid 0 %   Other Cells, Fluid MESOTHELIAL CELLS AND REACTIVE MESOTHELIAL CELLS %    Comment: Performed at [facility], [address], [place], [place] [address]  Amylase, pleural or peritoneal fluid     Status: None   Collection Time: 02/01/18  9:42 AM  Result Value Ref Range   Amylase, Fluid 13 U/L    Comment: NO NORMAL RANGE ESTABLISHED FOR THIS TEST Performed at [facility], [address], [place], [place] [address]    Fluid Type-FAMY ASCITIC     Comment: Performed at [facility], [address] [place], [place] [address] CORRECTED ON 05/07 AT 1000: PREVIOUSLY REPORTED AS PERITONEAL CAVITY   Albumin, pleural or peritoneal fluid     Status: None   Collection Time: 02/01/18  9:42 AM  Result Value Ref Range   Albumin, Fluid <1.0 g/dL    Comment: (NOTE) No normal range established for this test Results should be evaluated in conjunction with serum values    Fluid Type-FALB ASCITIC     Comment: Performed at [facility], [address] [place], [place] [address] CORRECTED ON 05/07 AT 1000: PREVIOUSLY REPORTED AS PERITONEAL CAVITY   Protein, pleural or peritoneal fluid     Status: None   Collection Time: 02/01/18  9:42 AM  Result Value Ref Range   Total protein, fluid <3.0 g/dL    Comment: (NOTE) No normal range established for this test Results should be evaluated in conjunction with serum values    Fluid Type-FTP ASCITIC     Comment: Performed at [facility], [address], [place], [place] [address] CORRECTED ON 05/07 AT 1000: PREVIOUSLY REPORTED AS PERITONEAL CAVITY   Total bilirubin, body fluid     Status: None   Collection Time: 02/01/18   9:42 AM  Result Value Ref Range   Source bili tot ASCITIC    Total bilirubin, fluid REJ5 mg/dL    Comment: (NOTE) The specimen submitted does not meet the laboratory's criteria for acceptability. Refer to [employer] [facility] [employer] for specimen acceptability criteria.      Rejected because specimen was not light protected      as required for [phone] per DOS.      Notified Lorette Ang 02/03/2018 Performed At: [facility] [address] [place], [place] [id number] Esau Grew MD [id number] Performed at [facility], [address], [place], [place] [address]   Culture, body fluid-bottle     Status: None   Collection Time: 02/01/18  9:42 AM  Result Value Ref Range   Specimen Description ASCITIC    Special Requests BOTTLES DRAWN AEROBIC AND ANAEROBIC 10 CC EACH    Culture      NO GROWTH 5 DAYS Performed at [facility], [address] [place], [place] [address]    Report Status 02/06/2018 FINAL   Gram stain     Status: None   Collection Time: 02/01/18  9:42 AM  Result Value Ref Range   Specimen Description ASCITIC    Special Requests NONE  Gram Stain      NO ORGANISMS SEEN WBC PRESENT, PREDOMINANTLY MONONUCLEAR CYTOSPIN SMEAR Performed at [facility], [address], [place], [place] [address]    Report Status 02/01/2018 FINAL   Acid Fast Smear (AFB)     Status: None   Collection Time: 02/01/18 10:08 AM  Result Value Ref Range   AFB Specimen Processing Concentration    Acid Fast Smear Negative     Comment: (NOTE) Performed At: [facility] [address] [place], [place] [id number] Rush Farmer MD [id number]    Source (AFB) ASCITIC     Comment: Performed at [facility], [address], [place], [place] [address]  Hepatitis A antibody, total     Status: Abnormal   Collection Time: 02/01/18 11:52 AM  Result Value Ref Range   Hep A Total Ab Positive (A) Negative    Comment: (NOTE) Performed At: [facility] [place], [place] [id number] Rush Farmer MD [id number] Performed at [facility], [address], [place], [place] [address]   Ceruloplasmin     Status: None   Collection Time: 02/01/18 11:52 AM  Result Value Ref Range   Ceruloplasmin 29.1 16.0 - 31.0 mg/dL    Comment: (NOTE) Performed At: [facility] [place], [place] [id number] Rush Farmer MD [id number] Performed at [facility], [address], [place], [place] [address]   IgG, IgA, IgM     Status: Abnormal   Collection Time: 02/01/18 11:52 AM  Result Value Ref Range   IgG (Immunoglobin G), Serum 2,143 (H) 700 - 1,600 mg/dL   IgA 1,184 (H) 90 - 386 mg/dL    Comment: (NOTE) Results confirmed on dilution.    IgM (Immunoglobulin M), Srm 119 20 - 172 mg/dL    Comment: (NOTE) Performed At: [facility] [place], [place] [id number] Rush Farmer MD [id number] Performed at [facility], [address], [place], [place] [address]   Mitochondrial antibodies     Status: None   Collection Time: 02/01/18 11:52 AM  Result Value Ref Range   Mitochondrial M2 Ab, IgG <20.0 0.0 - 20.0 Units    Comment: (NOTE)                                Negative    0.0 - 20.0                                Equivocal  20.1 - 24.9                                Positive         >24.9 Mitochondrial (M2) Antibodies are found in 90-96% of patients with primary biliary cirrhosis. Performed At: [facility] [place], [place] [id number] Rush Farmer MD [id number] Performed at [facility], [address], [place], [place] [address]   Anti-smooth muscle antibody, IgG     Status: Abnormal   Collection Time: 02/01/18 11:52 AM  Result Value Ref Range   F-Actin IgG 29 (H) 0 - 19 Units    Comment: (NOTE)                 Negative  0 - 19                 Weak positive               20 - 30                 Moderate to strong positive     >30 Actin  Antibodies are found in 52-85% of patients with autoimmune hepatitis or chronic active hepatitis and in 22% of patients with primary biliary cirrhosis. Performed At: [facility] [place], [place] [id number] Rush Farmer MD [id number] Performed at [facility], [address], [place], [place] [address]   Antinuclear Antibodies, IFA     Status: None   Collection Time: 02/01/18 11:52 AM  Result Value Ref Range   ANA Ab, IFA Negative     Comment: (NOTE)                                     Negative   <1:80                                     Borderline  1:80                                     Positive   >1:80 Performed At: [facility] [place], [place] [id number] Rush Farmer MD [id number] Performed at [facility], [address] [place], [place] [address]   Comprehensive metabolic panel     Status: Abnormal   Collection Time: 02/02/18  5:37 AM  Result Value Ref Range   Sodium 135 135 - 145 mmol/L   Potassium 4.0 3.5 - 5.1 mmol/L   Chloride 103 101 - 111 mmol/L   CO2 25 22 - 32 mmol/L   Glucose, Bld 85 65 - 99 mg/dL   BUN 5 (L) 6 - 20 mg/dL   Creatinine, Ser 0.48 (L) 0.61 - 1.24 mg/dL   Calcium 8.2 (L) 8.9 - 10.3 mg/dL   Total Protein 6.3 (L) 6.5 - 8.1 g/dL   Albumin 2.4 (L) 3.5 - 5.0 g/dL   AST 67 (H) 15 - 41 U/L   ALT 21 17 - 63 U/L   Alkaline Phosphatase 123 38 - 126 U/L   Total Bilirubin 2.8 (H) 0.3 - 1.2 mg/dL   GFR calc non Af Amer >60 >60 mL/min   GFR calc Af Amer >60 >60 mL/min    Comment: (NOTE) The eGFR has been calculated using the CKD EPI equation. This calculation has not been validated in all clinical situations. eGFR's persistently <60 mL/min signify possible Chronic Kidney Disease.    Anion gap 7 5 - 15    Comment: Performed at [facility], [address] [place], [place] [address]  CBC WITH DIFFERENTIAL     Status: Abnormal   Collection Time: 02/02/18  5:37 AM  Result Value Ref Range   WBC 3.7  (L) 4.0 - 10.5 K/uL   RBC 3.27 (L) 4.22 - 5.81 MIL/uL   Hemoglobin 7.1 (L) 13.0 - 17.0 g/dL   HCT 24.6 (L) 39.0 - 52.0 %   MCV 75.2 (L) 78.0 - 100.0 fL   MCH 21.7 (L) 26.0 - 34.0 pg   MCHC  28.9 (L) 30.0 - 36.0 g/dL   RDW 21.4 (H) 11.5 - 15.5 %   Platelets 71 (L) 150 - 400 K/uL    Comment: SPECIMEN CHECKED FOR CLOTS CONSISTENT WITH PREVIOUS RESULT    Neutrophils Relative % 41 %   Neutro Abs 1.5 (L) 1.7 - 7.7 K/uL   Lymphocytes Relative 30 %   Lymphs Abs 1.1 0.7 - 4.0 K/uL   Monocytes Relative 21 %   Monocytes Absolute 0.8 0.1 - 1.0 K/uL   Eosinophils Relative 6 %   Eosinophils Absolute 0.2 0.0 - 0.7 K/uL   Basophils Relative 2 %   Basophils Absolute 0.1 0.0 - 0.1 K/uL    Comment: Performed at [facility], [address] [place], [place] [address]  Magnesium     Status: Abnormal   Collection Time: 02/02/18  5:37 AM  Result Value Ref Range   Magnesium 1.6 (L) 1.7 - 2.4 mg/dL    Comment: Performed at [facility], [address] [place], [place] [address]  Ammonia     Status: Abnormal   Collection Time: 02/02/18  5:37 AM  Result Value Ref Range   Ammonia 79 (H) 9 - 35 umol/L    Comment: Performed at [facility], [address] [place], [place] [address]  Protime-INR     Status: Abnormal   Collection Time: 02/02/18  5:37 AM  Result Value Ref Range   Prothrombin Time 16.5 (H) 11.4 - 15.2 seconds   INR 1.35     Comment: Performed at [facility], [address] [place], [place] [address]  Comprehensive metabolic panel     Status: Abnormal   Collection Time: 02/03/18  5:39 AM  Result Value Ref Range   Sodium 135 135 - 145 mmol/L   Potassium 3.8 3.5 - 5.1 mmol/L   Chloride 101 101 - 111 mmol/L   CO2 27 22 - 32 mmol/L   Glucose, Bld 104 (H) 65 - 99 mg/dL   BUN 5 (L) 6 - 20 mg/dL   Creatinine, Ser 0.50 (L) 0.61 - 1.24 mg/dL   Calcium 8.4 (L) 8.9 - 10.3 mg/dL   Total Protein 6.3 (L) 6.5 - 8.1 g/dL   Albumin 2.4 (L) 3.5 - 5.0 g/dL   AST 68 (H) 15 - 41 U/L   ALT 22 17 - 63  U/L   Alkaline Phosphatase 111 38 - 126 U/L   Total Bilirubin 2.4 (H) 0.3 - 1.2 mg/dL   GFR calc non Af Amer >60 >60 mL/min   GFR calc Af Amer >60 >60 mL/min    Comment: (NOTE) The eGFR has been calculated using the CKD EPI equation. This calculation has not been validated in all clinical situations. eGFR's persistently <60 mL/min signify possible Chronic Kidney Disease.    Anion gap 7 5 - 15    Comment: Performed at [facility], [address] [place], [place] [address]  CBC WITH DIFFERENTIAL     Status: Abnormal   Collection Time: 02/03/18  5:39 AM  Result Value Ref Range   WBC 3.3 (L) 4.0 - 10.5 K/uL   RBC 3.26 (L) 4.22 - 5.81 MIL/uL   Hemoglobin 7.2 (L) 13.0 - 17.0 g/dL   HCT 24.7 (L) 39.0 - 52.0 %   MCV 75.8 (L) 78.0 - 100.0 fL   MCH 22.1 (L) 26.0 - 34.0 pg   MCHC 29.1 (L) 30.0 - 36.0 g/dL   RDW 21.8 (H) 11.5 - 15.5 %   Platelets 71 (L) 150 - 400 K/uL  Comment: SPECIMEN CHECKED FOR CLOTS PLATELET COUNT CONFIRMED BY SMEAR CONSISTENT WITH PREVIOUS RESULT    Neutrophils Relative % 35 %   Neutro Abs 1.2 (L) 1.7 - 7.7 K/uL   Lymphocytes Relative 32 %   Lymphs Abs 1.1 0.7 - 4.0 K/uL   Monocytes Relative 24 %   Monocytes Absolute 0.8 0.1 - 1.0 K/uL   Eosinophils Relative 7 %   Eosinophils Absolute 0.2 0.0 - 0.7 K/uL   Basophils Relative 2 %   Basophils Absolute 0.1 0.0 - 0.1 K/uL    Comment: Performed at [facility], [address], [place], [place] [address]  Magnesium     Status: Abnormal   Collection Time: 02/03/18  5:39 AM  Result Value Ref Range   Magnesium 1.5 (L) 1.7 - 2.4 mg/dL    Comment: Performed at [facility], [address] [place], [place] [address]  Ammonia     Status: Abnormal   Collection Time: 02/03/18  5:39 AM  Result Value Ref Range   Ammonia 76 (H) 9 - 35 umol/L    Comment: Performed at [facility], [address] [place], [place] [address]  Protime-INR     Status: Abnormal   Collection Time: 02/03/18  5:39 AM  Result Value Ref Range    Prothrombin Time 17.5 (H) 11.4 - 15.2 seconds   INR 1.45     Comment: Performed at [facility], [address], [place], [place] [address]  Comprehensive metabolic panel     Status: Abnormal   Collection Time: 02/04/18  5:19 AM  Result Value Ref Range   Sodium 135 135 - 145 mmol/L   Potassium 3.5 3.5 - 5.1 mmol/L   Chloride 102 101 - 111 mmol/L   CO2 26 22 - 32 mmol/L   Glucose, Bld 93 65 - 99 mg/dL   BUN 6 6 - 20 mg/dL   Creatinine, Ser 0.41 (L) 0.61 - 1.24 mg/dL   Calcium 8.5 (L) 8.9 - 10.3 mg/dL   Total Protein 6.2 (L) 6.5 - 8.1 g/dL   Albumin 2.5 (L) 3.5 - 5.0 g/dL   AST 67 (H) 15 - 41 U/L   ALT 22 17 - 63 U/L   Alkaline Phosphatase 98 38 - 126 U/L   Total Bilirubin 2.1 (H) 0.3 - 1.2 mg/dL   GFR calc non Af Amer >60 >60 mL/min   GFR calc Af Amer >60 >60 mL/min    Comment: (NOTE) The eGFR has been calculated using the CKD EPI equation. This calculation has not been validated in all clinical situations. eGFR's persistently <60 mL/min signify possible Chronic Kidney Disease.    Anion gap 7 5 - 15    Comment: Performed at [facility], [address] [place], [place] [address]  CBC WITH DIFFERENTIAL     Status: Abnormal   Collection Time: 02/04/18  5:19 AM  Result Value Ref Range   WBC 3.0 (L) 4.0 - 10.5 K/uL   RBC 3.18 (L) 4.22 - 5.81 MIL/uL   Hemoglobin 6.9 (LL) 13.0 - 17.0 g/dL    Comment: REPEATED TO VERIFY CRITICAL RESULT CALLED TO, READ BACK BY AND VERIFIED WITH: THOMAS @ 0704 ON [id number] BY HENDERSON L.    HCT 24.1 (L) 39.0 - 52.0 %   MCV 75.8 (L) 78.0 - 100.0 fL   MCH 21.7 (L) 26.0 - 34.0 pg   MCHC 28.6 (L) 30.0 - 36.0 g/dL   RDW 22.3 (H) 11.5 - 15.5 %   Platelets 72 (L) 150 -  400 K/uL    Comment: PLATELET COUNT CONFIRMED BY SMEAR SPECIMEN CHECKED FOR CLOTS    Neutrophils Relative % 31 %   Lymphocytes Relative 32 %   Monocytes Relative 27 %   Eosinophils Relative 7 %   Basophils Relative 3 %   Neutro Abs 0.9 (L) 1.7 - 7.7 K/uL   Lymphs Abs 1.0 0.7 - 4.0  K/uL   Monocytes Absolute 0.8 0.1 - 1.0 K/uL   Eosinophils Absolute 0.2 0.0 - 0.7 K/uL   Basophils Absolute 0.1 0.0 - 0.1 K/uL   RBC Morphology ANISOCYTES    WBC Morphology WHITE COUNT CONFIRMED ON SMEAR     Comment: Performed at [facility], [address] [place], [place] [address]  Magnesium     Status: Abnormal   Collection Time: 02/04/18  5:19 AM  Result Value Ref Range   Magnesium 1.4 (L) 1.7 - 2.4 mg/dL    Comment: Performed at [facility], [address] [place], [place] [address]  Protime-INR     Status: Abnormal   Collection Time: 02/04/18  5:19 AM  Result Value Ref Range   Prothrombin Time 18.2 (H) 11.4 - 15.2 seconds   INR 1.53     Comment: Performed at [facility], [address], [place], [place] [address]  Type and screen [facility]     Status: None   Collection Time: 02/04/18  7:38 AM  Result Value Ref Range   ABO/RH(D) A POS    Antibody Screen NEG    Sample Expiration 02/07/2018    Unit Number [id number]    Blood Component Type RED CELLS,LR    Unit division 00    Status of Unit ISSUED,FINAL    Transfusion Status OK TO TRANSFUSE    Crossmatch Result      Compatible Performed at [facility], [address] [place], [place] [address]   Prepare RBC     Status: None   Collection Time: 02/04/18  7:38 AM  Result Value Ref Range   Order Confirmation      ORDER PROCESSED BY BLOOD BANK Performed at [facility], [address] [name], [place] [address]   BPAM RBC     Status: None   Collection Time: 02/04/18  7:38 AM  Result Value Ref Range   ISSUE DATE / TIME [id number]    Blood Product Unit Number [id number]    PRODUCT CODE [id number]    Unit Type and Rh 0600    Blood Product Expiration Date [id number]     RADIOGRAPHIC STUDIES: I have personally reviewed the ultrasound of the abdomen dated 01/31/2018 which shows cirrhosis with marked splenomegaly with portal hypertension. ASSESSMENT & PLAN:  Symptomatic anemia 1.  Severe microcytic  anemia: - Due to chronic blood loss and impaired absorption from underlying cirrhosis, splenomegaly and portal hypertension. - Last paracentesis done with 5.18 L on 02/01/2018 and 4.8 L on 02/03/2017.  Continues Lasix and spironolactone. -Last Feraheme on 02/03/2018 and received 3 units of blood transfusion during the hospitalization.  Ferritin was 11 at that time. -Last CBC on 02/04/2018 shows hemoglobin of 6.9.  I have recommended repeating a CBC with iron panel and ferritin today.  I have recommended 2 more infusions of Feraheme.  We discussed about the side effects of Feraheme in detail.  Patient is agreeable to this plan.  I will see him back in 4 to 5 weeks with repeat CBC and iron panel. - EGD/colonoscopy will be scheduled by GI once patient gets financial clearance.  All questions were answered. The patient knows to call the clinic with any problems, questions or concerns.      Derek Jack, MD 03/09/18 2:09 PM

## 2018-03-09 NOTE — Patient Instructions (Addendum)
You can decrease the lasix and aldactone to once each morning. Let me know if you start having any fluid retention. Follow a low sodium diet, no more than 2 grams of sodium a day.   I suspect that Hematology may be drawing labs today, so I haven't ordered any today. I will see what is done, and then we will decide what else needs to be done.  I have given you a prescription for Hepatitis B vaccination. You are immune to Hepatitis A.   I am very proud of how you have continued to stay away from alcohol! This is awesome.  I recommend an upper endoscopy in the future once you are ready to proceed. I would also recommend a colonoscopy due to iron deficiency to be thorough.   Regardless, we will see you back in 3 months!  It was a pleasure to see you today. I strive to create trusting relationships with patients to provide genuine, compassionate, and quality care. I value your feedback. If you receive a survey regarding your visit,  I greatly appreciate you taking time to fill this out.   Gelene MinkAnna W. Boone, PhD, ANP-BC [facility]

## 2018-03-09 NOTE — Assessment & Plan Note (Addendum)
1.  Severe microcytic anemia: - Due to chronic blood loss and impaired absorption from underlying cirrhosis, splenomegaly and portal hypertension. - Last paracentesis done with 5.18 L on 02/01/2018 and 4.8 L on 02/03/2017.  Continues Lasix and spironolactone. -Last Feraheme on 02/03/2018 and received 3 units of blood transfusion during the hospitalization.  Ferritin was 11 at that time. -Last CBC on 02/04/2018 shows hemoglobin of 6.9.  I have recommended repeating a CBC with iron panel and ferritin today.  I have recommended 2 more infusions of Feraheme.  We discussed about the side effects of Feraheme in detail.  Patient is agreeable to this plan.  I will see him back in 4 to 5 weeks with repeat CBC and iron panel. - EGD/colonoscopy will be scheduled by GI once patient gets financial clearance.

## 2018-03-09 NOTE — Assessment & Plan Note (Signed)
History of ETOH cirrhosis, likely component of fatty liver disease. Improved s/p hospitalization with decompensated cirrhosis in setting of pneumonia. Continues to abstain from alcohol, now almost 2 months sober. Applauded on efforts. Anasarca improved, no evidence of ascites on exam. Will continue diuretics but at a lower dosing. Will continue to follow this closely. Follow low sodium diet. Needs Hep B vaccination; he is immune to Hep A. Discussed cirrhosis care in depth. Needs EGD in near future, but he would like to wait until after he has obtained [facility] Assistance. I would also like to update labs including INR, CBC, CMP, repeat immunoglobulins. Appt today with Hematology, so hopefully he can have this done all at once. Placed orders in system. In setting of IDA, also recommend colonoscopy. He is wanting to hold off on lower GI evaluation but willing to pursue EGD after financial assistance obtained. Return for close follow-up in 3 months. Next US in Oct 2019. Continue ETOH cessation and applauded on efforts.

## 2018-03-09 NOTE — Progress Notes (Signed)
Referring Provider: No ref. provider found Primary Care Physician:  Patient, No Pcp Per  Primary GI: Dr. Darrick PennaFields   Chief Complaint  Patient presents with  . ida    ? need for EGD  . Cirrhosis    HPI:   Grant [name] is a 37 y.o. male presenting today with a history of ETOH abuse, cirrhosis, ETOH hepatitis in 2013/2017, presenting in hospital follow-up. He was admitted in May 2019 with decompensated cirrhosis with anasarca in the setting of pneumonia. MELD Na 18, Child Pugh C at that time. [facility] and saw Dr. Teena DunkBenson several years ago with last EGD around 2016/2017. Paracentesis X 2 completed during May 2019 admission, no SBP. IDA noted while inpatient, receiving 2 units PRBCs as admitting Hgb was 6.2 on 5/6 and another unit 02/03/18 for a total of 3 units PRBCs. He will be seeing Hematology today at 1pm. Received one dose of Feraheme while inpatient 02/03/18. Last weight during hospitalization was 258. Today he is [address]. Last alcohol intake first week of April 2019.   Other serologies ordered with weakly positive ASMA, ANA negative, AMA negative, normal ceruloplasmin, significantly elevated IgG and IgA during admission. 2017 evaluation with negative Hep B serologies, negative Hep C antibody.   Immune to Hep A. Needs Hep B vaccination.   Lasix 40 mg BID and aldactone 100 BID. Feels like he doesn't need this anymore. Following a low salt diet "best I can". No hematochezia or melena. Tries to avoid spicy foods as this causes abdominal discomfort. Has to avoid steaks, pork chops as it feels harder to digest and has mild lower abdominal discomfort. States his dad has similar symptoms. No confusion. Doesn't sleep well at night. No prior colonoscopy. Rarely has constipation. Sometimes will have diarrhea.   [facility]. Wants to hold off on any invasive testing until he has [facility] arranged. Discussed need for EGD/TCS due to IDA. At a  minimum needs EGD. He is leaning towards EGD only and agreeable to this once cost is addressed.   [facility]. Has left sided facial bruising under eye and cheek. States much better. Hx avascular necrosis and lost his footing. Left orbital floor fracture.     Past Medical History:  Diagnosis Date  . Ascites 2013  . Campylobacter diarrhea 04/2016   bloody diarrhea.   . 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 Hepatitis B and C serologies negative 06/2016.     Marland Kitchen. Hypomagnesemia 01/31/2018  . Splenomegaly 2013  . Thrombocytopenia (HCC) 2013    Past Surgical History:  Procedure Laterality Date  . ESOPHAGOGASTRODUODENOSCOPY  08/2015   [place], Dr. Teena DunkBenson   . PARACENTESIS  2013   [facility] [place] [facility].     Current Outpatient Medications  Medication Sig Dispense Refill  . folic acid (FOLVITE) 1 MG tablet Take 1 tablet (1 mg total) by mouth daily. 30 tablet 1  . furosemide (LASIX) 40 MG tablet Take 1 tablet (40 mg total) by mouth 2 (two) times daily. 60 tablet 0  . Magnesium 250 MG TABS Take by mouth daily.    . Multiple Vitamin (MULTIVITAMIN WITH MINERALS) TABS tablet Take 1 tablet by mouth daily.    . Multiple Vitamin (MULTIVITAMIN) tablet Take 1 tablet by mouth daily.    Marland Kitchen. omeprazole (PRILOSEC) 20 MG capsule Take 20 mg by mouth 2 (two) times daily  before a meal.    . propranolol (INDERAL) 20 MG tablet Take 1 tablet (20 mg total) by mouth 2 (two) times daily. 60 tablet 0  . spironolactone (ALDACTONE) 100 MG tablet Take 1 tablet (100 mg total) by mouth 2 (two) times daily before a meal. 60 tablet 0  . thiamine 100 MG tablet Take 1 tablet (100 mg total) by mouth daily. 30 tablet 1  . pantoprazole (PROTONIX) 40 MG tablet Take 1 tablet (40 mg total) by mouth 2 (two) times daily before a meal. (Patient not taking: Reported on 03/09/2018) 60 tablet 0   No current facility-administered medications for this visit.      Allergies as of 03/09/2018  . (No Known Allergies)    Family History  Problem Relation Age of Onset  . Diabetes Father   . Hypertension Father   . Colon polyps Father 49  . Diabetes Other   . Cancer Neg Hx   . Colon cancer Neg Hx   . Liver disease Neg Hx     Social History   Socioeconomic History  . Marital status: Single    Spouse name: Not on file  . Number of children: Not on file  . Years of education: 2 to 3 yrs of colleg  . Highest education level: Not on file  Occupational History  . 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 Financial resource strain: Not on file  . Food insecurity:    Worry: Not on file    Inability: Not on file  . Transportation needs:    Medical: Not on file    Non-medical: Not on file  Tobacco Use  . Smoking status: Never Smoker  . Smokeless tobacco: Never Used  Substance and Sexual Activity  . Alcohol use: Yes    Alcohol/week: 12.0 oz    Types: 20 Cans of beer per week    Comment: history of ETOH abuse, sober for one month  . Drug use: No  . Sexual activity: Not on file  Lifestyle  . Physical activity:    Days per week: Not on file    Minutes per session: Not on file  . Stress: Not on file  Relationships  . Social connections:    Talks on phone: Not on file    Gets together: Not on file    Attends religious service: Not on file    Active member of club or organization: Not on file    Attends meetings of clubs or organizations: Not on file    Relationship status: Not on file  Other Topics Concern  . Not on file  Social History Narrative   Had nearly 1 year of college credit gained in high school AP classes.  Stayed in college for about 2 years, has about 3 yrs of college credits.     Review of Systems: As mentioned in HPI    Physical Exam: BP (!) 163/81   Pulse 61   Temp (!) 97.1 F (36.2 C) (Oral)   Ht 5\' 10"  (1.778 m)   Wt 203 lb (92.1 kg)   BMI 29.13 kg/m  General:    Alert and oriented. No distress noted. Pleasant and cooperative. Somewhat sallow-appearing. Facial bruising under left eye/cheek s/p fall  Head:  Normocephalic and atraumatic. Eyes:  Conjuctiva clear without scleral icterus. Mouth:  Oral mucosa pink and moist.  Abdomen:  +BS, soft, obese, hepatomegaly noted with liver margin noticeably palpable, non-tender  and non-distended. No rebound or guarding.  Msk:  Symmetrical without gross deformities. Normal posture. Extremities:  Without edema. Neurologic:  Alert and  oriented x4 Psych:  Alert and cooperative. Normal mood and affect.

## 2018-03-10 LAB — IGG, IGA, IGM
IGG (IMMUNOGLOBIN G), SERUM: 2032 mg/dL — AB (ref 700–1600)
IgA: 1191 mg/dL — ABNORMAL HIGH (ref 90–386)
IgM (Immunoglobulin M), Srm: 111 mg/dL (ref 20–172)

## 2018-03-11 ENCOUNTER — Other Ambulatory Visit: Payer: Self-pay

## 2018-03-11 ENCOUNTER — Encounter (HOSPITAL_COMMUNITY): Payer: Self-pay

## 2018-03-11 ENCOUNTER — Inpatient Hospital Stay (HOSPITAL_COMMUNITY): Payer: Self-pay

## 2018-03-11 MED ORDER — SODIUM CHLORIDE 0.9 % IV SOLN
INTRAVENOUS | Status: DC
  Administered 2018-03-11: 12:00:00 via INTRAVENOUS

## 2018-03-11 MED ORDER — SODIUM CHLORIDE 0.9 % IV SOLN
510.0000 mg | Freq: Once | INTRAVENOUS | Status: AC
  Administered 2018-03-11: 510 mg via INTRAVENOUS
  Filled 2018-03-11: qty 17

## 2018-03-11 NOTE — Patient Instructions (Signed)
[facility] at [facility] Discharge Instructions  Feraheme given, follow up as scheduled.   Thank you for choosing [facility] at [facility] to provide your oncology and hematology care.  To afford each patient quality time with our provider, please arrive at least 15 minutes before your scheduled appointment time.   If you have a lab appointment with the Cancer Center please come in thru the  Main Entrance and check in at the main information desk  You need to re-schedule your appointment should you arrive 10 or more minutes late.  We strive to give you quality time with our providers, and arriving late affects you and other patients whose appointments are after yours.  Also, if you no show three or more times for appointments you may be dismissed from the clinic at the providers discretion.     Again, thank you for choosing [facility].  Our hope is that these requests will decrease the amount of time that you wait before being seen by our physicians.       _____________________________________________________________  Should you have questions after your visit to [facility], please contact our office at [phone] between the hours of 8:30 a.m. and 4:30 p.m.  Voicemails left after 4:30 p.m. will not be returned until the following business day.  For prescription refill requests, have your pharmacy contact our office.       Resources For Cancer Patients and their Caregivers ? American Cancer Society: Can assist with transportation, wigs, general needs, runs Look Good Feel Better.        1-888-227-6333 ? Cancer Care: Provides financial assistance, online support groups, medication/co-pay assistance.  1-800-813-HOPE (4673) ? [facility] Assists [place] cancer patients and their families through emotional , educational and financial support.  336-427-4357 ? [place] DSS Where to  apply for food stamps, Medicaid and utility assistance. 336-342-1394 ? [facility]: Transportation to medical appointments. 336-347-2287 ? Social Security Administration: May apply for disability if have a Stage IV cancer. 336-342-7796 1-800-772-1213 ? [place] Aging, Disability and Transit Services: Assists with nutrition, care and transit needs. 336-349-2343  Cancer Center Support Programs:   > Cancer Support Group  2nd Tuesday of the month 1pm-2pm, Journey Room   > Creative Journey  3rd Tuesday of the month 1130am-1pm, Journey Room    

## 2018-03-11 NOTE — Progress Notes (Signed)
Feraheme given per orders. Patient tolerated it well without problems. Vitals stable and discharged home from clinic ambulatory. Follow up as scheduled.  

## 2018-03-17 NOTE — Progress Notes (Signed)
IgG is still doubled above upper limits of normal, IgA also significantly elevated, similar to inpatient stay. IgA can be elevated in setting of alcoholic liver disease. IgG should be followed, and ASMA was weakly positive. LFTs still with AST increased compare to ALT, consistent with history of alcohol. Bilirubin 3.5. MELD Na 16. Hgb improved now to 11.6. Platelets 80 and stable, actually improved from one month ago. We will repeat these labs when he returns in September, including ASMA, AMA. If he has persistent abnormalities, may need liver biopsy to exclude a co-existing liver disease that is not entirely related to alcohol,

## 2018-03-18 ENCOUNTER — Inpatient Hospital Stay (HOSPITAL_COMMUNITY): Payer: Self-pay

## 2018-03-18 ENCOUNTER — Encounter (HOSPITAL_COMMUNITY): Payer: Self-pay

## 2018-03-18 ENCOUNTER — Other Ambulatory Visit: Payer: Self-pay

## 2018-03-18 MED ORDER — SODIUM CHLORIDE 0.9 % IV SOLN
510.0000 mg | Freq: Once | INTRAVENOUS | Status: AC
  Administered 2018-03-18: 510 mg via INTRAVENOUS
  Filled 2018-03-18: qty 17

## 2018-03-18 MED ORDER — SODIUM CHLORIDE 0.9 % IV SOLN
INTRAVENOUS | Status: DC
  Administered 2018-03-18: 15:00:00 via INTRAVENOUS

## 2018-03-18 NOTE — Patient Instructions (Signed)
[facility] at [facility] Discharge Instruction [place] given Follow up as scheduled.   Thank you for choosing [facility] at [facility] to provide your oncology and hematology care.  To afford each patient quality time with our provider, please arrive at least 15 minutes before your scheduled appointment time.   If you have a lab appointment with the Cancer Center please come in thru the  Main Entrance and check in at the main information desk  You need to re-schedule your appointment should you arrive 10 or more minutes late.  We strive to give you quality time with our providers, and arriving late affects you and other patients whose appointments are after yours.  Also, if you no show three or more times for appointments you may be dismissed from the clinic at the providers discretion.     Again, thank you for choosing [facility].  Our hope is that these requests will decrease the amount of time that you wait before being seen by our physicians.       _____________________________________________________________  Should you have questions after your visit to [facility], please contact our office at [phone] between the hours of 8:30 a.m. and 4:30 p.m.  Voicemails left after 4:30 p.m. will not be returned until the following business day.  For prescription refill requests, have your pharmacy contact our office.       Resources For Cancer Patients and their Caregivers ? American Cancer Society: Can assist with transportation, wigs, general needs, runs Look Good Feel Better.        610-229-06241-442-417-4391 ? Cancer Care: Provides financial assistance, online support groups, medication/co-pay assistance.  1-800-813-HOPE (240) 751-3819(4673) ? [facility] Assists [place] cancer patients and their families through emotional , educational and financial support.  [phone] ? [place] DSS Where to apply  for food stamps, Medicaid and utility assistance. 534-475-9826(718)537-3360 ? [facility]: Transportation to medical appointments. 628-104-2791785-264-9132 ? Social Security Administration: May apply for disability if have a Stage IV cancer. 901-231-6033(205)841-0206 [phone] ? [place] [facility], Dis[facility] and [facility] Services: Assists with nutrition, care and transit needs. 3103353196(701)132-6368  Cancer Center Support Programs:   > Cancer Support Group  2nd Tuesday of the month 1pm-2pm, Journey Room   > Creative Journey  3rd Tuesday of the month 1130am-1pm, Journey Room

## 2018-03-21 LAB — ACID FAST CULTURE WITH REFLEXED SENSITIVITIES (MYCOBACTERIA)

## 2018-03-21 LAB — ACID FAST CULTURE WITH REFLEXED SENSITIVITIES: ACID FAST CULTURE - AFSCU3: NEGATIVE

## 2018-03-24 NOTE — Progress Notes (Signed)
LMOM to call.

## 2018-04-12 ENCOUNTER — Other Ambulatory Visit (HOSPITAL_COMMUNITY): Payer: Self-pay

## 2018-04-18 ENCOUNTER — Inpatient Hospital Stay (HOSPITAL_COMMUNITY): Payer: Self-pay | Attending: Hematology

## 2018-04-18 ENCOUNTER — Ambulatory Visit (HOSPITAL_COMMUNITY): Payer: Self-pay | Admitting: Hematology

## 2018-04-18 DIAGNOSIS — K766 Portal hypertension: Secondary | ICD-10-CM | POA: Insufficient documentation

## 2018-04-18 DIAGNOSIS — D500 Iron deficiency anemia secondary to blood loss (chronic): Secondary | ICD-10-CM | POA: Insufficient documentation

## 2018-04-18 DIAGNOSIS — R161 Splenomegaly, not elsewhere classified: Secondary | ICD-10-CM | POA: Insufficient documentation

## 2018-04-18 DIAGNOSIS — K7460 Unspecified cirrhosis of liver: Secondary | ICD-10-CM | POA: Insufficient documentation

## 2018-04-18 DIAGNOSIS — D649 Anemia, unspecified: Secondary | ICD-10-CM

## 2018-04-18 LAB — CBC WITH DIFFERENTIAL/PLATELET
BASOS ABS: 0.1 10*3/uL (ref 0.0–0.1)
BASOS PCT: 1 %
EOS ABS: 0.1 10*3/uL (ref 0.0–0.7)
Eosinophils Relative: 3 %
HCT: 42.7 % (ref 39.0–52.0)
HEMOGLOBIN: 14.6 g/dL (ref 13.0–17.0)
LYMPHS ABS: 0.8 10*3/uL (ref 0.7–4.0)
Lymphocytes Relative: 14 %
MCH: 31.9 pg (ref 26.0–34.0)
MCHC: 34.2 g/dL (ref 30.0–36.0)
MCV: 93.2 fL (ref 78.0–100.0)
Monocytes Absolute: 0.8 10*3/uL (ref 0.1–1.0)
Monocytes Relative: 15 %
NEUTROS PCT: 67 %
Neutro Abs: 3.7 10*3/uL (ref 1.7–7.7)
Platelets: 52 10*3/uL — ABNORMAL LOW (ref 150–400)
RBC: 4.58 MIL/uL (ref 4.22–5.81)
RDW: 19.4 % — ABNORMAL HIGH (ref 11.5–15.5)
WBC: 5.6 10*3/uL (ref 4.0–10.5)

## 2018-04-18 LAB — IRON AND TIBC
IRON: 40 ug/dL — AB (ref 45–182)
Saturation Ratios: 14 % — ABNORMAL LOW (ref 17.9–39.5)
TIBC: 291 ug/dL (ref 250–450)
UIBC: 251 ug/dL

## 2018-04-18 LAB — FERRITIN: FERRITIN: 334 ng/mL (ref 24–336)

## 2018-04-25 ENCOUNTER — Other Ambulatory Visit: Payer: Self-pay

## 2018-04-25 ENCOUNTER — Encounter (HOSPITAL_COMMUNITY): Payer: Self-pay | Admitting: Hematology

## 2018-04-25 ENCOUNTER — Inpatient Hospital Stay (HOSPITAL_BASED_OUTPATIENT_CLINIC_OR_DEPARTMENT_OTHER): Payer: Self-pay | Admitting: Hematology

## 2018-04-25 VITALS — BP 119/63 | HR 90 | Temp 98.5°F | Resp 16 | Wt 217.4 lb

## 2018-04-25 DIAGNOSIS — R161 Splenomegaly, not elsewhere classified: Secondary | ICD-10-CM

## 2018-04-25 DIAGNOSIS — D649 Anemia, unspecified: Secondary | ICD-10-CM

## 2018-04-25 DIAGNOSIS — D500 Iron deficiency anemia secondary to blood loss (chronic): Secondary | ICD-10-CM

## 2018-04-25 DIAGNOSIS — K766 Portal hypertension: Secondary | ICD-10-CM

## 2018-04-25 DIAGNOSIS — K7460 Unspecified cirrhosis of liver: Secondary | ICD-10-CM

## 2018-04-25 NOTE — Progress Notes (Signed)
[facility] [address] [place] [place] [address]   CLINIC:  Medical Oncology/Hematology  PCP:  Patient, No Pcp Per No address on file None   REASON FOR VISIT:  Follow-up for severe microcytic anemia  CURRENT THERAPY: Intermittent iron infusions   INTERVAL HISTORY:  Grant [name] 37 y.o. male returns for routine follow-up for severe microcytic anemia. Patient is here today and feeling good. His energy levels remain high. He lives at home and performs all his own ADLs and activity. Patient is taking an oral iron supplement daily and having no problems with the medication. Denies any bleeding or easy bruising. Denies any new pains. Denies any nausea, vomiting, or diarrhea. Denies constipations.    REVIEW OF SYSTEMS:  Review of Systems  All other systems reviewed and are negative.    PAST MEDICAL/SURGICAL HISTORY:  Past Medical History:  Diagnosis Date  . Ascites 2013  . Campylobacter diarrhea 04/2016   bloody diarrhea.   . 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 Hepatitis B and C serologies negative 06/2016.     Marland Kitchen Hypomagnesemia 01/31/2018  . Splenomegaly 2013  . Thrombocytopenia (HCC) 2013   Past Surgical History:  Procedure Laterality Date  . ESOPHAGOGASTRODUODENOSCOPY  08/2015   [place], Dr. Teena Dunk   . PARACENTESIS  2013   In [place] at [facility] hospital.      SOCIAL HISTORY:  Social History   Socioeconomic History  . Marital status: Single    Spouse name: Not on file  . Number of children: Not on file  . Years of education: 2 to 3 yrs of colleg  . Highest education level: Not on file  Occupational History  . 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 Financial resource strain: Not on file  . Food insecurity:    Worry: Not on file    Inability: Not on file  . Transportation needs:    Medical: Not on file    Non-medical: Not on file  Tobacco Use  .  Smoking status: Never Smoker  . Smokeless tobacco: Never Used  Substance and Sexual Activity  . Alcohol use: Yes    Alcohol/week: 12.0 oz    Types: 20 Cans of beer per week    Comment: history of ETOH abuse, sober for one month  . Drug use: No  . Sexual activity: Not on file  Lifestyle  . Physical activity:    Days per week: Not on file    Minutes per session: Not on file  . Stress: Not on file  Relationships  . Social connections:    Talks on phone: Not on file    Gets together: Not on file    Attends religious service: Not on file    Active member of club or organization: Not on file    Attends meetings of clubs or organizations: Not on file    Relationship status: Not on file  . Intimate partner violence:    Fear of current or ex partner: Not on file    Emotionally abused: Not on file    Physically abused: Not on file    Forced sexual activity: Not on file  Other Topics Concern  . Not on file  Social History Narrative   Had nearly 1 year of college credit gained in high school AP classes.  Stayed in college for about 2 years, has about 3  yrs of college credits.     FAMILY HISTORY:  Family History  Problem Relation Age of Onset  . Diabetes Father   . Hypertension Father   . Colon polyps Father 94  . Diabetes Other   . Cancer Neg Hx   . Colon cancer Neg Hx   . Liver disease Neg Hx     CURRENT MEDICATIONS:  Outpatient Encounter Medications as of 04/25/2018  Medication Sig  . folic acid (FOLVITE) 1 MG tablet Take 1 tablet (1 mg total) by mouth daily.  . furosemide (LASIX) 40 MG tablet Take 40 mg by mouth daily.  . Magnesium 250 MG TABS Take by mouth daily.  . Multiple Vitamin (MULTIVITAMIN) tablet Take 1 tablet by mouth daily.  Marland Kitchen omeprazole (PRILOSEC) 20 MG capsule Take 1 capsule (20 mg total) by mouth 2 (two) times daily before a meal.  . propranolol (INDERAL) 20 MG tablet Take 1 tablet (20 mg total) by mouth 2 (two) times daily.  Marland Kitchen thiamine 100 MG tablet Take 1  tablet (100 mg total) by mouth daily.  Marland Kitchen spironolactone (ALDACTONE) 100 MG tablet Take 1 tablet (100 mg total) by mouth daily.  . [DISCONTINUED] furosemide (LASIX) 40 MG tablet Take 1 tablet (40 mg total) by mouth daily.  . [DISCONTINUED] Multiple Vitamin (MULTIVITAMIN WITH MINERALS) TABS tablet Take 1 tablet by mouth daily.   No facility-administered encounter medications on file as of 04/25/2018.     ALLERGIES:  No Known Allergies   PHYSICAL EXAM:  ECOG Performance status: 0  Vitals:   04/25/18 1544  BP: 119/63  Pulse: 90  Resp: 16  Temp: 98.5 F (36.9 C)  SpO2: 93%   Filed Weights   04/25/18 1544  Weight: 217 lb 6.4 oz (98.6 kg)    Physical Exam   LABORATORY DATA:  I have reviewed the labs as listed.  CBC    Component Value Date/Time   WBC 5.6 04/18/2018 1016   RBC 4.58 04/18/2018 1016   HGB 14.6 04/18/2018 1016   HCT 42.7 04/18/2018 1016   PLT 52 (L) 04/18/2018 1016   MCV 93.2 04/18/2018 1016   MCH 31.9 04/18/2018 1016   MCHC 34.2 04/18/2018 1016   RDW 19.4 (H) 04/18/2018 1016   LYMPHSABS 0.8 04/18/2018 1016   MONOABS 0.8 04/18/2018 1016   EOSABS 0.1 04/18/2018 1016   BASOSABS 0.1 04/18/2018 1016   CMP Latest Ref Rng & Units 03/09/2018 02/04/2018 02/03/2018  Glucose 65 - 99 mg/dL 91 93 130(Q)  BUN 6 - 20 mg/dL 7 6 5(L)  Creatinine 6.57 - 1.24 mg/dL 8.46(N) 6.29(B) 2.84(X)  Sodium 135 - 145 mmol/L 137 135 135  Potassium 3.5 - 5.1 mmol/L 3.6 3.5 3.8  Chloride 101 - 111 mmol/L 99(L) 102 101  CO2 22 - 32 mmol/L 28 26 27   Calcium 8.9 - 10.3 mg/dL 9.2 3.2(G) 4.0(N)  Total Protein 6.5 - 8.1 g/dL 0.2(V) 6.2(L) 6.3(L)  Total Bilirubin 0.3 - 1.2 mg/dL 2.5(D) 2.1(H) 2.4(H)  Alkaline Phos 38 - 126 U/L 142(H) 98 111  AST 15 - 41 U/L 98(H) 67(H) 68(H)  ALT 17 - 63 U/L 43 22 22        ASSESSMENT & PLAN:   Symptomatic anemia 1.  Severe microcytic anemia: - Due to chronic blood loss and impaired absorption from underlying cirrhosis, splenomegaly and portal  hypertension. - Last paracentesis done with 5.18 L on 02/01/2018 and 4.8 L on 02/03/2017.  Continues Lasix and spironolactone. -Last Feraheme on 02/03/2018 and received 3  units of blood transfusion during the hospitalization.  Ferritin was 11 at that time. - Received 2 more infusions of Feraheme on 03/11/2018 and 03/18/2018.  Denies any bleeding per rectum or melena. -I discussed the results of the CBC which showed hemoglobin improved 14.6.  Ferritin has improved to 334.  He does not require any parenteral iron therapy at this time.  He will continue iron tablet daily.  We will recheck his labs in 2 months.      Orders placed this encounter:  Orders Placed This Encounter  Procedures  . CBC with Differential/Platelet  . Comprehensive metabolic panel  . Ferritin  . Iron and TIBC  . Vitamin B12  . Folate      Doreatha MassedSreedhar Katragadda, MD [facility] [phone]

## 2018-04-25 NOTE — Assessment & Plan Note (Signed)
1.  Severe microcytic anemia: - Due to chronic blood loss and impaired absorption from underlying cirrhosis, splenomegaly and portal hypertension. - Last paracentesis done with 5.18 L on 02/01/2018 and 4.8 L on 02/03/2017.  Continues Lasix and spironolactone. -Last Feraheme on 02/03/2018 and received 3 units of blood transfusion during the hospitalization.  Ferritin was 11 at that time. - Received 2 more infusions of Feraheme on 03/11/2018 and 03/18/2018.  Denies any bleeding per rectum or melena. -I discussed the results of the CBC which showed hemoglobin improved 14.6.  Ferritin has improved to 334.  He does not require any parenteral iron therapy at this time.  He will continue iron tablet daily.  We will recheck his labs in 2 months.

## 2018-06-13 ENCOUNTER — Other Ambulatory Visit: Payer: Self-pay | Admitting: Gastroenterology

## 2018-06-15 ENCOUNTER — Telehealth: Payer: Self-pay | Admitting: Gastroenterology

## 2018-06-15 ENCOUNTER — Ambulatory Visit (INDEPENDENT_AMBULATORY_CARE_PROVIDER_SITE_OTHER): Payer: Self-pay | Admitting: Gastroenterology

## 2018-06-15 ENCOUNTER — Encounter: Payer: Self-pay | Admitting: Gastroenterology

## 2018-06-15 VITALS — BP 125/73 | HR 76 | Temp 98.0°F | Ht 70.0 in | Wt 251.4 lb

## 2018-06-15 DIAGNOSIS — K7031 Alcoholic cirrhosis of liver with ascites: Secondary | ICD-10-CM

## 2018-06-15 MED ORDER — SPIRONOLACTONE 100 MG PO TABS: 100.0000 mg | ORAL_TABLET | Freq: Two times a day (BID) | ORAL | 3 refills | Status: DC

## 2018-06-15 MED ORDER — FUROSEMIDE 40 MG PO TABS: 40.0000 mg | ORAL_TABLET | Freq: Two times a day (BID) | ORAL | 5 refills | Status: DC

## 2018-06-15 NOTE — Telephone Encounter (Signed)
Please let patient know we don't have any other options for the [facility] assistance. However, please encourage him to check with DSS in [place]. He should possibly qualify for Medicaid as he would be considered disabled, but I don't know if he has documentation of that. I can help with letter if needed. We have limited options here from our standpoint.

## 2018-06-15 NOTE — Assessment & Plan Note (Signed)
History of cirrhosis secondary to ETOH and likely fatty liver component as well, now with worsening ascites and mild lower extremity edema. He has not been taking spironolactone inadvertently but has continued on lasix 40 mg daily. Will need para in near future. Unfortunately, he has been denied financial assistance and unable to obtain Medicaid. Difficult situation currently, as he needs screening EGD, US for surveillance purposes. He is declining labs now but will be having in 1 week. Will increase to Lasix 40 mg BID and spironolactone 100 BID which he has been on previously. Closely follow renal, electrolytes. He is agreeable to paracentesis in near future. Holding on liver ultrasound due to financial concern.   Follow low sodium diet strictly, discussed in detail. Return in 2-3 months.

## 2018-06-15 NOTE — Progress Notes (Signed)
Referring Provider: No ref. provider found Primary Care Physician:  Patient, No Pcp Per Primary GI: Dr. Darrick Penna   Chief Complaint  Patient presents with  . Cirrhosis    f/u.   Marland Kitchen abdominal swelling    x 1 month    HPI:   Grant [name] is a 37 y.o. male presenting today with a history of ETOH abuse, cirrhosis, ETOH hepatitis in 2013/2017, admitted in May 2019 with decompensated cirrhosis with anasarca in the setting of pneumonia. MELD Na 18, Child Pugh C at that time. Previously evaluated at [facility] in 2013 and saw Dr. Teena Dunk several years ago with last EGD around 2016/2017. Paracentesis X 2 completed during May 2019 admission, no SBP. IDA noted while inpatient, receiving 2 units PRBCs as admitting Hgb was 6.2 on 5/6 and another unit 02/03/18 for a total of 3 units PRBCs. Received one dose of Feraheme while inpatient 02/03/18. 2 additional iron infusions June 2019. Followed by Hematology. Due for labs next week.   He was 258 during hospitalization, 203 in June 2019, now back to 251.   Needs Hep B vaccination. Needs EGD in near future but was holding off on this at last visit. Wanted to hold off on colonoscopy.   Was to be on Lasix 40 mg daily and aldactone 100 mg daily. Just on lasix 40 mg daily. Trying to follow low salt diet. Sometimes fast foods. Every other week may have fast food. Canned veggies, canned soup.   Further serologies with ASMA weakly positive, IgA and IgG elevated. Needs to repeat now. May need liver biopsy to exclude co-existing liver disease.   Last ETOH intake in April 2019.   He has been denied [employer]. He also has been denied Medicaid. Self pay currently and declining labs today, wanting to wait till next week. Unable to pursue EGD currently. Able to pursue paracentesis.   Past Medical History:  Diagnosis Date  . Ascites 2013  . Campylobacter diarrhea 04/2016   bloody diarrhea.   . 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 obesity related fatty  liver disease. Hepatitis B and C serologies negative 06/2016.     Marland Kitchen Hypomagnesemia 01/31/2018  . Splenomegaly 2013  . Thrombocytopenia (HCC) 2013    Past Surgical History:  Procedure Laterality Date  . ESOPHAGOGASTRODUODENOSCOPY  08/2015   [place], Dr. Teena Dunk   . PARACENTESIS  2013   In [place] at [facility].     Current Outpatient Medications  Medication Sig Dispense Refill  . folic acid (FOLVITE) 1 MG tablet Take 1 tablet (1 mg total) by mouth daily. 30 tablet 1  . furosemide (LASIX) 40 MG tablet Take 1 tablet (40 mg total) by mouth 2 (two) times daily. 60 tablet 5  . Magnesium 250 MG TABS Take by mouth daily.    . Multiple Vitamin (MULTIVITAMIN) tablet Take 1 tablet by mouth daily.    Marland Kitchen omeprazole (PRILOSEC) 20 MG capsule Take 1 capsule (20 mg total) by mouth 2 (two) times daily before a meal. 60 capsule 5  . propranolol (INDERAL) 20 MG tablet TAKE 1 TABLET BY MOUTH TWICE DAILY 60 tablet 5  . thiamine 100 MG tablet Take 1 tablet (100 mg total) by mouth daily. 30 tablet 1  . spironolactone (ALDACTONE) 100 MG tablet Take 1 tablet (100 mg total) by mouth 2 (two) times daily. 60 tablet 3   No current facility-administered medications for this visit.     Allergies as of 06/15/2018  . (  No Known Allergies)    Family History  Problem Relation Age of Onset  . Diabetes Father   . Hypertension Father   . Colon polyps Father 81  . Diabetes Other   . Cancer Neg Hx   . Colon cancer Neg Hx   . Liver disease Neg Hx     Social History   Socioeconomic History  . Marital status: Single    Spouse name: Not on file  . Number of children: Not on file  . Years of education: 2 to 3 yrs of colleg  . Highest education level: Not on file  Occupational History  . 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 Financial resource strain: Not on file  . Food insecurity:    Worry: Not on file    Inability: Not on file  . Transportation  needs:    Medical: Not on file    Non-medical: Not on file  Tobacco Use  . Smoking status: Never Smoker  . Smokeless tobacco: Never Used  Substance and Sexual Activity  . Alcohol use: Yes    Alcohol/week: 20.0 standard drinks    Types: 20 Cans of beer per week    Comment: history of ETOH abuse, Last drank in April 2019  . Drug use: No  . Sexual activity: Not on file  Lifestyle  . Physical activity:    Days per week: Not on file    Minutes per session: Not on file  . Stress: Not on file  Relationships  . Social connections:    Talks on phone: Not on file    Gets together: Not on file    Attends religious service: Not on file    Active member of club or organization: Not on file    Attends meetings of clubs or organizations: Not on file    Relationship status: Not on file  Other Topics Concern  . Not on file  Social History Narrative   Had nearly 1 year of college credit gained in high school AP classes.  Stayed in college for about 2 years, has about 3 yrs of college credits.     Review of Systems: Gen: Denies fever, chills, anorexia. Denies fatigue, weakness, weight loss.  CV: Denies chest pain, palpitations, syncope, peripheral edema, and claudication. Resp: Denies dyspnea at rest, cough, wheezing, coughing up blood, and pleurisy. GI: see HPI  Derm: Denies rash, itching, dry skin Psych: Denies depression, anxiety, memory loss, confusion. No homicidal or suicidal ideation.  Heme: Denies bruising, bleeding, and enlarged lymph nodes.  Physical Exam: BP 125/73   Pulse 76   Temp 98 F (36.7 C) (Oral)   Ht 5\' 10"  (1.778 m)   Wt 251 lb 6.4 oz (114 kg)   BMI 36.07 kg/m  General:   Alert and oriented. No distress noted. Pleasant and cooperative.  Head:  Normocephalic and atraumatic. Eyes:  Conjuctiva clear without scleral icterus. Mouth:  Oral mucosa pink and moist.  Abdomen:  +BS, soft, obese with large pannus, distended upper abdomen with ascites, protruding umbilical  hernia with fragile skin, no TTP Msk:  Symmetrical without gross deformities. Normal posture. Extremities:  With 1+ edema bilateral lower extremities  Neurologic:  Alert and  oriented x4 Psych:  Alert and cooperative. Normal mood and affect.  Lab Results  Component Value Date   WBC 5.6 04/18/2018   HGB 14.6 04/18/2018   HCT 42.7 04/18/2018   MCV 93.2 04/18/2018  PLT 52 (L) 04/18/2018   Lab Results  Component Value Date   ALT 43 03/09/2018   AST 98 (H) 03/09/2018   ALKPHOS 142 (H) 03/09/2018   BILITOT 3.5 (H) 03/09/2018   Lab Results  Component Value Date   CREATININE 0.44 (L) 03/09/2018   BUN 7 03/09/2018   NA 137 03/09/2018   K 3.6 03/09/2018   CL 99 (L) 03/09/2018   CO2 28 03/09/2018

## 2018-06-15 NOTE — Telephone Encounter (Signed)
Pt is aware and he will check and see and let us know if he needs letter from [place].

## 2018-06-15 NOTE — Progress Notes (Signed)
NO PCP PER PATIENT °

## 2018-06-15 NOTE — Patient Instructions (Addendum)
I have sent in lasix 40 milligrams and spironolactone 100 milligrams to take twice a day.  Please do blood work next week as already planned.  We have ordered a paracentesis. At some point in the future, you will need further imaging of your liver.  Please call with any concerns or changes.   I will see you back in about 2-3 months.  Follow the low salt diet that I have attached. It is important to not take in any more than 2 grams total a day.  We are trying to help facilitate an assistance plan/insurance for you.  It was a pleasure to see you today. I strive to create trusting relationships with patients to provide genuine, compassionate, and quality care. I value your feedback. If you receive a survey regarding your visit,  I greatly appreciate you taking time to fill this out.   Gelene Mink, PhD, ANP-BC [facility]    Low-Sodium Eating Plan Sodium, which is an element that makes up salt, helps you maintain a healthy balance of fluids in your body. Too much sodium can increase your blood pressure and cause fluid and waste to be held in your body. Your health care provider or dietitian may recommend following this plan if you have high blood pressure (hypertension), kidney disease, liver disease, or heart failure. Eating less sodium can help lower your blood pressure, reduce swelling, and protect your heart, liver, and kidneys. What are tips for following this plan? General guidelines  Most people on this plan should limit their sodium intake to 1,500-2,000 mg (milligrams) of sodium each day. Reading food labels  The Nutrition Facts label lists the amount of sodium in one serving of the food. If you eat more than one serving, you must multiply the listed amount of sodium by the number of servings.  Choose foods with less than 140 mg of sodium per serving.  Avoid foods with 300 mg of sodium or more per serving. Shopping  Look for lower-sodium products, often  labeled as "low-sodium" or "no salt added."  Always check the sodium content even if foods are labeled as "unsalted" or "no salt added".  Buy fresh foods. ? Avoid canned foods and premade or frozen meals. ? Avoid canned, cured, or processed meats  Buy breads that have less than 80 mg of sodium per slice. Cooking  Eat more home-cooked food and less restaurant, buffet, and fast food.  Avoid adding salt when cooking. Use salt-free seasonings or herbs instead of table salt or sea salt. Check with your health care provider or pharmacist before using salt substitutes.  Cook with plant-based oils, such as canola, sunflower, or olive oil. Meal planning  When eating at a restaurant, ask that your food be prepared with less salt or no salt, if possible.  Avoid foods that contain MSG (monosodium glutamate). MSG is sometimes added to [place] food, bouillon, and some canned foods. What foods are recommended? The items listed may not be a complete list. Talk with your dietitian about what dietary choices are best for you. Grains Low-sodium cereals, including oats, puffed wheat and rice, and shredded wheat. Low-sodium crackers. Unsalted rice. Unsalted pasta. Low-sodium bread. Whole-grain breads and whole-grain pasta. Vegetables Fresh or frozen vegetables. "No salt added" canned vegetables. "No salt added" tomato sauce and paste. Low-sodium or reduced-sodium tomato and vegetable juice. Fruits Fresh, frozen, or canned fruit. Fruit juice. Meats and other protein foods Fresh or frozen (no salt added) meat, poultry, seafood, and fish. Low-sodium canned tuna  and salmon. Unsalted nuts. Dried peas, beans, and lentils without added salt. Unsalted canned beans. Eggs. Unsalted nut butters. Dairy Milk. Soy milk. Cheese that is naturally low in sodium, such as ricotta cheese, fresh mozzarella, or Swiss cheese Low-sodium or reduced-sodium cheese. Cream cheese. Yogurt. Fats and oils Unsalted butter. Unsalted  margarine with no trans fat. Vegetable oils such as canola or olive oils. Seasonings and other foods Fresh and dried herbs and spices. Salt-free seasonings. Low-sodium mustard and ketchup. Sodium-free salad dressing. Sodium-free light mayonnaise. Fresh or refrigerated horseradish. Lemon juice. Vinegar. Homemade, reduced-sodium, or low-sodium soups. Unsalted popcorn and pretzels. Low-salt or salt-free chips. What foods are not recommended? The items listed may not be a complete list. Talk with your dietitian about what dietary choices are best for you. Grains Instant hot cereals. Bread stuffing, pancake, and biscuit mixes. Croutons. Seasoned rice or pasta mixes. Noodle soup cups. Boxed or frozen macaroni and cheese. Regular salted crackers. Self-rising flour. Vegetables Sauerkraut, pickled vegetables, and relishes. Olives. [place] fries. Onion rings. Regular canned vegetables (not low-sodium or reduced-sodium). Regular canned tomato sauce and paste (not low-sodium or reduced-sodium). Regular tomato and vegetable juice (not low-sodium or reduced-sodium). Frozen vegetables in sauces. Meats and other protein foods Meat or fish that is salted, canned, smoked, spiced, or pickled. Bacon, ham, sausage, hotdogs, corned beef, chipped beef, packaged lunch meats, salt pork, jerky, pickled herring, anchovies, regular canned tuna, sardines, salted nuts. Dairy Processed cheese and cheese spreads. Cheese curds. Blue cheese. Feta cheese. String cheese. Regular cottage cheese. Buttermilk. Canned milk. Fats and oils Salted butter. Regular margarine. Ghee. Bacon fat. Seasonings and other foods Onion salt, garlic salt, seasoned salt, table salt, and sea salt. Canned and packaged gravies. Worcestershire sauce. Tartar sauce. Barbecue sauce. Teriyaki sauce. Soy sauce, including reduced-sodium. Steak sauce. Fish sauce. Oyster sauce. Cocktail sauce. Horseradish that you find on the shelf. Regular ketchup and mustard. Meat  flavorings and tenderizers. Bouillon cubes. Hot sauce and Tabasco sauce. Premade or packaged marinades. Premade or packaged taco seasonings. Relishes. Regular salad dressings. Salsa. Potato and tortilla chips. Corn chips and puffs. Salted popcorn and pretzels. Canned or dried soups. Pizza. Frozen entrees and pot pies. Summary  Eating less sodium can help lower your blood pressure, reduce swelling, and protect your heart, liver, and kidneys.  Most people on this plan should limit their sodium intake to 1,500-2,000 mg (milligrams) of sodium each day.  Canned, boxed, and frozen foods are high in sodium. Restaurant foods, fast foods, and pizza are also very high in sodium. You also get sodium by adding salt to food.  Try to cook at home, eat more fresh fruits and vegetables, and eat less fast food, canned, processed, or prepared foods. This information is not intended to replace advice given to you by your health care provider. Make sure you discuss any questions you have with your health care provider. Document Released: 03/06/2002 Document Revised: 09/07/2016 Document Reviewed: 09/07/2016 Elsevier Interactive Patient Education  [employer]2018 Elsevier Inc.

## 2018-06-21 ENCOUNTER — Ambulatory Visit (HOSPITAL_COMMUNITY)
Admission: RE | Admit: 2018-06-21 | Discharge: 2018-06-21 | Disposition: A | Discharge status: Home / Self Care | Payer: Medicaid - Out of State | Source: Ambulatory Visit | Attending: Gastroenterology | Admitting: Gastroenterology

## 2018-06-21 ENCOUNTER — Encounter (HOSPITAL_COMMUNITY): Payer: Self-pay

## 2018-06-21 DIAGNOSIS — K7031 Alcoholic cirrhosis of liver with ascites: Secondary | ICD-10-CM | POA: Insufficient documentation

## 2018-06-21 LAB — GRAM STAIN: Gram Stain: NONE SEEN

## 2018-06-21 LAB — BODY FLUID CELL COUNT WITH DIFFERENTIAL
Eos, Fluid: 0 %
LYMPHS FL: 66 %
MONOCYTE-MACROPHAGE-SEROUS FLUID: 24 % — AB (ref 50–90)
NEUTROPHIL FLUID: 10 % (ref 0–25)
WBC FLUID: 134 uL (ref 0–1000)

## 2018-06-21 MED ORDER — ALBUMIN HUMAN 25 % IV SOLN
25.0000 g | Freq: Once | INTRAVENOUS | Status: AC
  Administered 2018-06-21: 25 g via INTRAVENOUS

## 2018-06-21 MED ORDER — ALBUMIN HUMAN 25 % IV SOLN
INTRAVENOUS | Status: AC
  Filled 2018-06-21: qty 100

## 2018-06-21 NOTE — Progress Notes (Signed)
Paracentesis complete no signs of distress.  

## 2018-06-21 NOTE — Progress Notes (Signed)
Negative SBP. When will he be doing his labs?

## 2018-06-21 NOTE — Procedures (Signed)
PreOperative Dx: Alcoholic cirrhosis, ascites Postoperative Dx: Alcoholic cirrhosis, ascites Procedure:   US guided paracentesis Radiologist:  Tyron RussellBoles Anesthesia:  10 ml of1% lidocaine Specimen:  5.6 L of yellow ascitic fluid EBL:   < 1 ml Complications: None

## 2018-06-23 ENCOUNTER — Inpatient Hospital Stay (HOSPITAL_COMMUNITY): Payer: Medicaid - Out of State | Attending: Hematology

## 2018-06-23 ENCOUNTER — Ambulatory Visit (HOSPITAL_COMMUNITY): Admission: RE | Admit: 2018-06-23 | Payer: Self-pay | Source: Ambulatory Visit

## 2018-06-23 DIAGNOSIS — D500 Iron deficiency anemia secondary to blood loss (chronic): Secondary | ICD-10-CM | POA: Diagnosis present

## 2018-06-23 DIAGNOSIS — D649 Anemia, unspecified: Secondary | ICD-10-CM

## 2018-06-23 LAB — COMPREHENSIVE METABOLIC PANEL
ALBUMIN: 2.9 g/dL — AB (ref 3.5–5.0)
ALT: 40 U/L (ref 0–44)
ANION GAP: 9 (ref 5–15)
AST: 142 U/L — ABNORMAL HIGH (ref 15–41)
Alkaline Phosphatase: 196 U/L — ABNORMAL HIGH (ref 38–126)
BUN: <5 mg/dL — ABNORMAL LOW (ref 6–20)
CO2: 24 mmol/L (ref 22–32)
Calcium: 8.4 mg/dL — ABNORMAL LOW (ref 8.9–10.3)
Chloride: 99 mmol/L (ref 98–111)
Creatinine, Ser: 0.53 mg/dL — ABNORMAL LOW (ref 0.61–1.24)
GFR calc Af Amer: >60 mL/min (ref >60)
GLUCOSE: 131 mg/dL — AB (ref 70–99)
POTASSIUM: 3 mmol/L — AB (ref 3.5–5.1)
Sodium: 132 mmol/L — ABNORMAL LOW (ref 135–145)
TOTAL PROTEIN: 7.5 g/dL (ref 6.5–8.1)
Total Bilirubin: 4.1 mg/dL — ABNORMAL HIGH (ref 0.3–1.2)

## 2018-06-23 LAB — CBC WITH DIFFERENTIAL/PLATELET
BASOS PCT: 2 %
Basophils Absolute: 0.1 10*3/uL (ref 0.0–0.1)
EOS PCT: 4 %
Eosinophils Absolute: 0.2 10*3/uL (ref 0.0–0.7)
HEMATOCRIT: 39.8 % (ref 39.0–52.0)
Hemoglobin: 13.4 g/dL (ref 13.0–17.0)
Lymphocytes Relative: 20 %
Lymphs Abs: 1.3 10*3/uL (ref 0.7–4.0)
MCH: 33 pg (ref 26.0–34.0)
MCHC: 33.7 g/dL (ref 30.0–36.0)
MCV: 98 fL (ref 78.0–100.0)
MONO ABS: 0.8 10*3/uL (ref 0.1–1.0)
MONOS PCT: 12 %
NEUTROS ABS: 4 10*3/uL (ref 1.7–7.7)
Neutrophils Relative %: 62 %
PLATELETS: 128 10*3/uL — AB (ref 150–400)
RBC: 4.06 MIL/uL — ABNORMAL LOW (ref 4.22–5.81)
RDW: 15.9 % — AB (ref 11.5–15.5)
WBC: 6.5 10*3/uL (ref 4.0–10.5)

## 2018-06-23 LAB — FOLATE: Folate: 26.3 ng/mL (ref >5.9)

## 2018-06-23 LAB — IRON AND TIBC
Iron: 26 ug/dL — ABNORMAL LOW (ref 45–182)
SATURATION RATIOS: 10 % — AB (ref 17.9–39.5)
TIBC: 265 ug/dL (ref 250–450)
UIBC: 239 ug/dL

## 2018-06-23 LAB — FERRITIN: FERRITIN: 85 ng/mL (ref 24–336)

## 2018-06-23 LAB — PATHOLOGIST SMEAR REVIEW

## 2018-06-23 LAB — VITAMIN B12: Vitamin B-12: 1391 pg/mL — ABNORMAL HIGH (ref 180–914)

## 2018-06-26 LAB — CULTURE, BODY FLUID W GRAM STAIN -BOTTLE: Culture: NO GROWTH

## 2018-06-26 LAB — CULTURE, BODY FLUID-BOTTLE

## 2018-06-27 ENCOUNTER — Other Ambulatory Visit: Payer: Self-pay

## 2018-06-27 ENCOUNTER — Telehealth: Payer: Self-pay | Admitting: Gastroenterology

## 2018-06-27 ENCOUNTER — Other Ambulatory Visit (HOSPITAL_COMMUNITY)
Admission: RE | Admit: 2018-06-27 | Discharge: 2018-06-27 | Disposition: A | Discharge status: Home / Self Care | Payer: Medicaid - Out of State | Source: Ambulatory Visit | Attending: Gastroenterology | Admitting: Gastroenterology

## 2018-06-27 DIAGNOSIS — K7030 Alcoholic cirrhosis of liver without ascites: Secondary | ICD-10-CM

## 2018-06-27 DIAGNOSIS — R69 Illness, unspecified: Secondary | ICD-10-CM | POA: Diagnosis present

## 2018-06-27 LAB — BASIC METABOLIC PANEL
Anion gap: 9 (ref 5–15)
BUN: 5 mg/dL — AB (ref 6–20)
CALCIUM: 8.4 mg/dL — AB (ref 8.9–10.3)
CO2: 26 mmol/L (ref 22–32)
CREATININE: 0.56 mg/dL — AB (ref 0.61–1.24)
Chloride: 99 mmol/L (ref 98–111)
GFR calc Af Amer: >60 mL/min (ref >60)
GLUCOSE: 108 mg/dL — AB (ref 70–99)
Potassium: 3.7 mmol/L (ref 3.5–5.1)
Sodium: 134 mmol/L — ABNORMAL LOW (ref 135–145)

## 2018-06-27 NOTE — Progress Notes (Signed)
Sodium slightly down. Potassium stable now. Continue Lasix 40 mg BID and aldactone 100 mg BID. He really needs to be in a place where he can be seen routinely and have financial assistance. Are there any places in [place] that offer this? I would love to get him in to see transplant. 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 His bilirubin is rising, needs abdominal imaging, and there is concern for further decompensation. Best options are to get on Medicaid or have disability if that is possible. Did he check into the options with DSS? I will write letter if needed. Imperative he has continued access to care. We will continue seeing him, but financially he is limited as he is paying out of pocket.

## 2018-06-27 NOTE — Telephone Encounter (Signed)
I called and informed pt. He said he will probably have to get an ostomy bag since it is leaking quite a bit at times. He will come to the lab at the hospital this afternoon for the BMP. He has not been given any potassium and wanted to know if he could take OTC potassium. I told him when we get results of the blood work I will let him know what Lewie Loron, NP says. He said he is taking Lasix 40 mg bid and Spironolactone 1000 mg bid.  I am faxing the lab order to the hospital lab. He will let us know if he has a problem getting the ostomy bags.

## 2018-06-27 NOTE — Telephone Encounter (Signed)
PT said he has herniated belly button and it started leaking on Sat. He has used towels to keep it soaked up. I told him he should have went to ED if he is having to use towels. He it was not bothering him other than just to soak up the fluid. York Spaniel he was hoping there was something else he could do without having to go to the hospital since it is so expensive. Tobi Bastos, please advise!

## 2018-06-27 NOTE — Telephone Encounter (Signed)
PATIENT CALLED BACK INQUIRING IF Grant [name] HAS ANY SUGGESTIONS ON HIS ABD LEAKAGE

## 2018-06-27 NOTE — Telephone Encounter (Signed)
See result note. Continue lasix 40 mg BID and aldactone 100 mg BID. No additional potassium needed. See result note for more in depth instructions.

## 2018-06-27 NOTE — Telephone Encounter (Signed)
See previous note, I have spoken to pt.  

## 2018-06-27 NOTE — Telephone Encounter (Signed)
[phone]  Please call patient, he is leaking fluid from his belly button and can not get it to stop

## 2018-06-27 NOTE — Telephone Encounter (Signed)
Discussed with Dr. Lovell Sheehan. Limited options. Can cover with gauze if only intermittent leaking. If significant amount, can use ostomy bag to cover over this. I believe these can be obtained from [facility]? If persistent, Dr. Lovell Sheehan recommended referral to [facility].  I looked at labs from Sept 2019 ordered by another provide. Bilirubin increasing. His potassium was 3.0. Creatinine stable. What diuretic regimen is he on currently, just want to verify this. Was he given any potassium supplementation? We need to recheck BMP now.

## 2018-06-28 NOTE — Progress Notes (Signed)
Pt is aware. Said he doesn't know of places in [place] where he could get assistance. York Spaniel he was turned down for Medicaid and Disability because he was single and didn't have any children. He has some more appts he wants to get through before he trys to find something. He said he will call and let you know when he needs a letter. His most urgent concern now is the bag that he finally found at [facility] yesterday is not very helpful. He needs something a little stronger and to fit a little better to get the fluid that is draining. Tobi Bastos, any suggestions?

## 2018-06-28 NOTE — Progress Notes (Signed)
We need to get him into an urgent slot this week. Please ask Grant [name] for help if not able to find one this week.

## 2018-06-28 NOTE — Progress Notes (Signed)
PT is scheduled for 06/29/2018 with Wynne Dust, NP.

## 2018-06-28 NOTE — Telephone Encounter (Signed)
See result note. Pt is aware.  

## 2018-06-29 ENCOUNTER — Other Ambulatory Visit (HOSPITAL_COMMUNITY)
Admission: RE | Admit: 2018-06-29 | Discharge: 2018-06-29 | Disposition: A | Discharge status: Home / Self Care | Payer: Medicaid - Out of State | Source: Ambulatory Visit | Attending: Nurse Practitioner | Admitting: Nurse Practitioner

## 2018-06-29 ENCOUNTER — Encounter: Payer: Self-pay | Admitting: Nurse Practitioner

## 2018-06-29 ENCOUNTER — Ambulatory Visit (INDEPENDENT_AMBULATORY_CARE_PROVIDER_SITE_OTHER): Payer: Self-pay | Admitting: Nurse Practitioner

## 2018-06-29 VITALS — BP 117/73 | HR 65 | Temp 97.8°F | Ht 70.0 in | Wt 211.0 lb

## 2018-06-29 DIAGNOSIS — K7031 Alcoholic cirrhosis of liver with ascites: Secondary | ICD-10-CM | POA: Insufficient documentation

## 2018-06-29 LAB — COMPREHENSIVE METABOLIC PANEL
ALT: 34 U/L (ref 0–44)
ANION GAP: 10 (ref 5–15)
AST: 115 U/L — AB (ref 15–41)
Albumin: 3 g/dL — ABNORMAL LOW (ref 3.5–5.0)
Alkaline Phosphatase: 199 U/L — ABNORMAL HIGH (ref 38–126)
BILIRUBIN TOTAL: 5 mg/dL — AB (ref 0.3–1.2)
BUN: 8 mg/dL (ref 6–20)
CALCIUM: 8.9 mg/dL (ref 8.9–10.3)
CO2: 25 mmol/L (ref 22–32)
CREATININE: 0.83 mg/dL (ref 0.61–1.24)
Chloride: 99 mmol/L (ref 98–111)
GFR calc Af Amer: >60 mL/min (ref >60)
GLUCOSE: 93 mg/dL (ref 70–99)
Potassium: 4.4 mmol/L (ref 3.5–5.1)
Sodium: 134 mmol/L — ABNORMAL LOW (ref 135–145)
TOTAL PROTEIN: 8 g/dL (ref 6.5–8.1)

## 2018-06-29 LAB — PROTIME-INR
INR: 1.36
Prothrombin Time: 16.6 seconds — ABNORMAL HIGH (ref 11.4–15.2)

## 2018-06-29 NOTE — Patient Instructions (Signed)
1. Have your labs drawn when you are able to. 2. We will help schedule your ultrasound for you. 3. We will call you back with possible recommendations about a different type of bag that may be easier to manage. 4. We will discuss where you would prefer to be referred to: [facility] in [place], [place] or [facility] in [place], [place]  At [facility] we value your feedback. You may receive a survey about your visit today. Please share your experience as we strive to create trusting relationships with our patients to provide genuine, compassionate, quality care.  We appreciate your understanding and patience as we review any laboratory studies, imaging, and other diagnostic tests that are ordered as we care for you. Our office policy is 5 business days for review of these results, and any emergent or urgent results are addressed in a timely manner for your best interest. If you do not hear from our office in 1 week, please contact us.   We also encourage the use of MyChart, which contains your medical information for your review as well. If you are not enrolled in this feature, an access code is on this after visit summary for your convenience. Thank you for allowing [place] to be involved in your care.  It was great to meet you today!  I hope you have a great Fall!!

## 2018-06-29 NOTE — Progress Notes (Signed)
Referring Provider: No ref. provider found Primary Care Physician:  Patient, No Pcp Per Primary GI:  Dr. Darrick Penna  Chief Complaint  Patient presents with  . drainage from belly button    yellow-clear in color, slight odor. Started Saturday afternoon    HPI:   Grant [name] is a 37 y.o. male who presents for follow-up on alcoholic cirrhosis.  The patient was last seen in our office 06/15/2018 for the same.  Gross is complicated by ascites.  Noted history of EtOH abuse, cirrhosis, EtOH hepatitis in 2013 and again in 2017.  Status post admission May 2019 with decompensated cirrhosis with anasarca in the setting of pneumonia with MELD Na 18, child Pugh C at that time.  Deviously evaluated currently in clinic in 2013 and saw Dr. Teena Dunk several years ago with last EGD in 2016 or 2017.  During his recent admission paracentesis x2 was completed, no SBP noted.  [facility] while [facility] receiving 3 units of PRBCs with admitting hemoglobin 6.2, 2 additional iron infusions in June 2019, he is followed by hematology.  His weight was 258 pounds during hospitalization, 203 in June 2019, at his last office visit he was back to 251.  Needs hepatitis B vaccination.  Need to EGD in the future but was holding off.  Also wants to hold off on colonoscopy.  Trying to follow a low-salt diet but eats fast food about every other week.  Eats canned vegetables and canned soup.  Further serologies with ASMA weakly positive, IgA and IgG elevated. Needs to repeat now. May need liver biopsy to exclude co-existing liver disease.   Last EtOH in April 2019.  Has been denied current assistance and Medicaid.  Currently self-pay and declining labs at his last visit wanting to wait until the following week.  Unable to pursue EGD because of financial reasons.  He is able to pursue paracentesis.  After his last visit it was recommended to start Lasix 40 mg and Spironolactone 100 mg twice daily.  Labs to be completed the following week.   Paracentesis ordered, recommended further liver imaging in the future.  Follow-up in 2 to 3 months.  Follow low-salt diet and additional information was attached to his AVS.  He called our office complaining of umbilical hernia that is leaking peritoneal fluid.  Discussion with surgeon discovered limited options can cover with gauze and if a significant amount can use an ostomy bag.  If it is persistent, surgery recommends referral to [facility].  Based on previous labs in September 2019 by another provider his bilirubin was increasing and his potassium is 3.0.  Recommended repeat BMP.  His lab work found potassium 3.7.  No potassium supplementation needed.  Commended continue current diuretics.  Most recent ultrasound paracentesis completed 06/21/2018 which resulted in successful abdominal paracentesis of approximately 5.6 L of ascitic fluid sample sent to the lab as requested.  Labs indicative of negative for SBP.  Review of most recent labs completed 06/23/2018 found normal kidney function, AST elevated at 142, alkaline phosphatase elevated at 196, bilirubin steadily increasing and now at 4.1.  Platelets significantly improved but still suppressed at 128 (compared to previous values of 52, 80, 72).  Ferritin normal, iron low at 26, saturation low at 10%.  Today he states he's still having persistent umbilical drainage. He has a colostomy bag attached to his abdomen to collect the fluids. He states it seems to be draining less now then it was Saturday. The drainage started about 4 days ago  and is persistent. Drainage is variable throughout the day, more when standing or walking, less to none if laying flat. He feels there's a slight odor, which is more pronounced then it was [place]. Fluid was pretty clear initially but now with a yellow tint. Has been taking diuretics which has decreased on diuretics. Denies abdominal pain, N/V, fever, chills. Denies yellow of skin/eyes, acute episodic confusion, darkened  urine, tremors/shakes. Denies chest pain, dyspnea, dizziness, lightheadedness, syncope, near syncope. Denies any other upper or lower GI symptoms.  Wt Readings from Last 3 Encounters:  06/29/18 211 lb (95.7 kg)  06/15/18 251 lb 6.4 oz (114 kg)  04/25/18 217 lb 6.4 oz (98.6 kg)    Past Medical History:  Diagnosis Date  . Ascites 2013  . Campylobacter diarrhea 04/2016   bloody diarrhea.   . 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 Hepatitis B and C serologies negative 06/2016.     Marland Kitchen Hypomagnesemia 01/31/2018  . Splenomegaly 2013  . Thrombocytopenia (HCC) 2013    Past Surgical History:  Procedure Laterality Date  . ESOPHAGOGASTRODUODENOSCOPY  08/2015   [place], Dr. Teena Dunk   . PARACENTESIS  2013   In [place] at [facility] associated hospital.     Current Outpatient Medications  Medication Sig Dispense Refill  . folic acid (FOLVITE) 1 MG tablet Take 1 tablet (1 mg total) by mouth daily. 30 tablet 1  . furosemide (LASIX) 40 MG tablet Take 1 tablet (40 mg total) by mouth 2 (two) times daily. 60 tablet 5  . Magnesium 250 MG TABS Take by mouth daily.    . Multiple Vitamin (MULTIVITAMIN) tablet Take 1 tablet by mouth daily.    Marland Kitchen omeprazole (PRILOSEC) 20 MG capsule Take 1 capsule (20 mg total) by mouth 2 (two) times daily before a meal. 60 capsule 5  . propranolol (INDERAL) 20 MG tablet TAKE 1 TABLET BY MOUTH TWICE DAILY 60 tablet 5  . spironolactone (ALDACTONE) 100 MG tablet Take 1 tablet (100 mg total) by mouth 2 (two) times daily. 60 tablet 3  . thiamine 100 MG tablet Take 1 tablet (100 mg total) by mouth daily. 30 tablet 1   No current facility-administered medications for this visit.     Allergies as of 06/29/2018  . (No Known Allergies)    Family History  Problem Relation Age of Onset  . Diabetes Father   . Hypertension Father   . Colon polyps Father 28  . Diabetes Other   . Cancer Neg Hx   . Colon cancer Neg Hx   . Liver disease Neg Hx      Social History   Socioeconomic History  . Marital status: Single    Spouse name: Not on file  . Number of children: Not on file  . Years of education: 2 to 3 yrs of colleg  . Highest education level: Not on file  Occupational History  . 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 Financial resource strain: Not on file  . Food insecurity:    Worry: Not on file    Inability: Not on file  . Transportation needs:    Medical: Not on file    Non-medical: Not on file  Tobacco Use  . Smoking status: Never Smoker  . Smokeless tobacco: Never Used  Substance and Sexual Activity  . Alcohol use: Yes    Alcohol/week: 20.0 standard drinks  Types: 20 Cans of beer per week    Comment: history of ETOH abuse, Last drank in April 2019 as of 06/29/18.  . Drug use: No  . Sexual activity: Not on file  Lifestyle  . Physical activity:    Days per week: Not on file    Minutes per session: Not on file  . Stress: Not on file  Relationships  . Social connections:    Talks on phone: Not on file    Gets together: Not on file    Attends religious service: Not on file    Active member of club or organization: Not on file    Attends meetings of clubs or organizations: Not on file    Relationship status: Not on file  Other Topics Concern  . Not on file  Social History Narrative   Had nearly 1 year of college credit gained in high school AP classes.  Stayed in college for about 2 years, has about 3 yrs of college credits.     Review of Systems: Complete ROS negative except as per HPI.   Physical Exam: BP 117/73   Pulse 65   Temp 97.8 F (36.6 C) (Oral)   Ht 5\' 10"  (1.778 m)   Wt 211 lb (95.7 kg)   BMI 30.28 kg/m  General:   Alert and oriented. Pleasant and cooperative. Well-nourished and well-developed. Obese male. Eyes:  Without icterus, sclera clear and conjunctiva pink.  Ears:  Normal auditory acuity. Cardiovascular:  S1, S2 present  without murmurs appreciated. Extremities without clubbing or edema. Respiratory:  Clear to auscultation bilaterally. No wheezes, rales, or rhonchi. No distress.  Gastrointestinal:  +BS, soft, non-tender and somewhat distended. No HSM noted. No guarding or rebound. No masses appreciated. Ostomy bag attached to the umbilicus with a cloudy yellow drainage noted in the bag. Rectal:  Deferred  Musculoskalatal:  Symmetrical without gross deformities. Neurologic:  Alert and oriented x4;  grossly normal neurologically. Psych:  Alert and cooperative. Normal mood and affect. Heme/Lymph/Immune: No excessive bruising noted.    06/29/2018 10:47 AM   Disclaimer: This note was dictated with voice recognition software. Similar sounding words can inadvertently be transcribed and may not be corrected upon review.

## 2018-06-30 ENCOUNTER — Telehealth: Payer: Self-pay | Admitting: Nurse Practitioner

## 2018-06-30 ENCOUNTER — Telehealth (HOSPITAL_COMMUNITY): Payer: Self-pay | Admitting: Hematology

## 2018-06-30 ENCOUNTER — Encounter (HOSPITAL_COMMUNITY): Payer: Self-pay | Admitting: Hematology

## 2018-06-30 ENCOUNTER — Ambulatory Visit (HOSPITAL_COMMUNITY)
Admission: RE | Admit: 2018-06-30 | Discharge: 2018-06-30 | Disposition: A | Discharge status: Home / Self Care | Payer: Medicaid - Out of State | Source: Ambulatory Visit | Attending: Nurse Practitioner | Admitting: Nurse Practitioner

## 2018-06-30 ENCOUNTER — Other Ambulatory Visit: Payer: Self-pay

## 2018-06-30 ENCOUNTER — Inpatient Hospital Stay (HOSPITAL_COMMUNITY): Payer: Medicaid - Out of State | Attending: Hematology | Admitting: Hematology

## 2018-06-30 VITALS — BP 118/64 | HR 72 | Temp 98.5°F | Resp 18 | Wt 211.8 lb

## 2018-06-30 DIAGNOSIS — D649 Anemia, unspecified: Secondary | ICD-10-CM

## 2018-06-30 DIAGNOSIS — Z79899 Other long term (current) drug therapy: Secondary | ICD-10-CM | POA: Diagnosis not present

## 2018-06-30 DIAGNOSIS — I81 Portal vein thrombosis: Secondary | ICD-10-CM | POA: Diagnosis not present

## 2018-06-30 DIAGNOSIS — R161 Splenomegaly, not elsewhere classified: Secondary | ICD-10-CM | POA: Diagnosis not present

## 2018-06-30 DIAGNOSIS — K7460 Unspecified cirrhosis of liver: Secondary | ICD-10-CM | POA: Diagnosis not present

## 2018-06-30 DIAGNOSIS — K7031 Alcoholic cirrhosis of liver with ascites: Secondary | ICD-10-CM | POA: Diagnosis not present

## 2018-06-30 DIAGNOSIS — D696 Thrombocytopenia, unspecified: Secondary | ICD-10-CM

## 2018-06-30 DIAGNOSIS — D509 Iron deficiency anemia, unspecified: Secondary | ICD-10-CM | POA: Diagnosis present

## 2018-06-30 DIAGNOSIS — K766 Portal hypertension: Secondary | ICD-10-CM

## 2018-06-30 NOTE — Telephone Encounter (Signed)
Spoke with Dr. Kirtland Bouchard in heme/onc. Will forward official U/S report when completed. He feels anticoagulation is feesible because the patient is not actively bleeding. The patient would benefit form a [facility]) but options may be limited due to no insurance. We will work on putting out feelers to see if we can get him accepted somewhere.  MB/MS: can we contact [facility], [facility], [facility] to see about possibility of sending him there for decompensating cirrhosis?

## 2018-06-30 NOTE — Telephone Encounter (Signed)
FILLED OUT APP FOR AMAG ASSIST/FERAHEME. PENDING FIN DOC FROM PT

## 2018-06-30 NOTE — Progress Notes (Signed)
[facility] [address], [place] [address]   CLINIC:  Medical Oncology/Hematology  PCP:  Patient, No Pcp Per No address on file None   REASON FOR VISIT: Follow-up for severe microcytic anemia  CURRENT THERAPY: intermittent iron infusion   INTERVAL HISTORY:  Grant [name] 37 y.o. male returns for routine follow-up for severe microcytic anemia. Patient is doing well. He is more fatigued than normal. He lives in [place] and performs all his own ADLs and activities. His appetite is good. He denies any bleeding or dark stools. Denies any nausea, vomiting, or diarrhea. Denies any new pains.     REVIEW OF SYSTEMS:  Review of Systems  Constitutional: Positive for fatigue.  All other systems reviewed and are negative.    PAST MEDICAL/SURGICAL HISTORY:  Past Medical History:  Diagnosis Date  . Ascites 2013  . Campylobacter diarrhea 04/2016   bloody diarrhea.   . 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 Hepatitis B and C serologies negative 06/2016.     Marland Kitchen Hypomagnesemia 01/31/2018  . Splenomegaly 2013  . Thrombocytopenia (HCC) 2013   Past Surgical History:  Procedure Laterality Date  . ESOPHAGOGASTRODUODENOSCOPY  08/2015   [place], Dr. Teena Dunk   . PARACENTESIS  2013   In [place] at [facility] associated hospital.      SOCIAL HISTORY:  Social History   Socioeconomic History  . Marital status: Single    Spouse name: Not on file  . Number of children: Not on file  . Years of education: 2 to 3 yrs of colleg  . Highest education level: Not on file  Occupational History  . 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 Financial resource strain: Not on file  . Food insecurity:    Worry: Not on file    Inability: Not on file  . Transportation needs:    Medical: Not on file    Non-medical: Not on file  Tobacco Use  . Smoking status: Never Smoker  . Smokeless tobacco: Never Used    Substance and Sexual Activity  . Alcohol use: Yes    Alcohol/week: 20.0 standard drinks    Types: 20 Cans of beer per week    Comment: history of ETOH abuse, Last drank in April 2019 as of 06/29/18.  . Drug use: No  . Sexual activity: Not on file  Lifestyle  . Physical activity:    Days per week: Not on file    Minutes per session: Not on file  . Stress: Not on file  Relationships  . Social connections:    Talks on phone: Not on file    Gets together: Not on file    Attends religious service: Not on file    Active member of club or organization: Not on file    Attends meetings of clubs or organizations: Not on file    Relationship status: Not on file  . Intimate partner violence:    Fear of current or ex partner: Not on file    Emotionally abused: Not on file    Physically abused: Not on file    Forced sexual activity: Not on file  Other Topics Concern  . Not on file  Social History Narrative   Had nearly 1 year of college credit gained in high school AP classes.  Stayed in college for about 2 years, has about 3 yrs of college  credits.     FAMILY HISTORY:  Family History  Problem Relation Age of Onset  . Diabetes Father   . Hypertension Father   . Colon polyps Father 22  . Diabetes Other   . Cancer Neg Hx   . Colon cancer Neg Hx   . Liver disease Neg Hx     CURRENT MEDICATIONS:  Outpatient Encounter Medications as of 06/30/2018  Medication Sig  . folic acid (FOLVITE) 1 MG tablet Take 1 tablet (1 mg total) by mouth daily.  . furosemide (LASIX) 40 MG tablet Take 1 tablet (40 mg total) by mouth 2 (two) times daily.  . Magnesium 250 MG TABS Take by mouth daily.  . Multiple Vitamin (MULTIVITAMIN) tablet Take 1 tablet by mouth daily.  Marland Kitchen omeprazole (PRILOSEC) 20 MG capsule Take 1 capsule (20 mg total) by mouth 2 (two) times daily before a meal.  . propranolol (INDERAL) 20 MG tablet TAKE 1 TABLET BY MOUTH TWICE DAILY  . spironolactone (ALDACTONE) 100 MG tablet Take 1  tablet (100 mg total) by mouth 2 (two) times daily.  Marland Kitchen thiamine 100 MG tablet Take 1 tablet (100 mg total) by mouth daily.   No facility-administered encounter medications on file as of 06/30/2018.     ALLERGIES:  No Known Allergies   PHYSICAL EXAM:  ECOG Performance status: 1  Vitals:   06/30/18 1434  BP: 118/64  Pulse: 72  Resp: 18  Temp: 98.5 F (36.9 C)  SpO2: 97%   Filed Weights   06/30/18 1434  Weight: 211 lb 12.8 oz (96.1 kg)    Physical Exam  Constitutional: He is oriented to person, place, and time. He appears well-developed and well-nourished.  Abdominal:  Colostomy bag  Musculoskeletal: Normal range of motion.  Neurological: He is alert and oriented to person, place, and time.  Skin: Skin is warm and dry.  Psychiatric: He has a normal mood and affect. His behavior is normal. Judgment and thought content normal.     LABORATORY DATA:  I have reviewed the labs as listed.  CBC    Component Value Date/Time   WBC 6.5 06/23/2018 1144   RBC 4.06 (L) 06/23/2018 1144   HGB 13.4 06/23/2018 1144   HCT 39.8 06/23/2018 1144   PLT 128 (L) 06/23/2018 1144   MCV 98.0 06/23/2018 1144   MCH 33.0 06/23/2018 1144   MCHC 33.7 06/23/2018 1144   RDW 15.9 (H) 06/23/2018 1144   LYMPHSABS 1.3 06/23/2018 1144   MONOABS 0.8 06/23/2018 1144   EOSABS 0.2 06/23/2018 1144   BASOSABS 0.1 06/23/2018 1144   CMP Latest Ref Rng & Units 06/29/2018 06/27/2018 06/23/2018  Glucose 70 - 99 mg/dL 93 161(W) 960(A)  BUN 6 - 20 mg/dL 8 5(L) <5(W)  Creatinine 0.61 - 1.24 mg/dL 0.98 1.19(J) 4.78(G)  Sodium 135 - 145 mmol/L 134(L) 134(L) 132(L)  Potassium 3.5 - 5.1 mmol/L 4.4 3.7 3.0(L)  Chloride 98 - 111 mmol/L 99 99 99  CO2 22 - 32 mmol/L 25 26 24   Calcium 8.9 - 10.3 mg/dL 8.9 9.5(A) 2.1(H)  Total Protein 6.5 - 8.1 g/dL 8.0 - 7.5  Total Bilirubin 0.3 - 1.2 mg/dL 5.0(H) - 4.1(H)  Alkaline Phos 38 - 126 U/L 199(H) - 196(H)  AST 15 - 41 U/L 115(H) - 142(H)  ALT 0 - 44 U/L 34 - 40           ASSESSMENT & PLAN:   Symptomatic anemia 1.  Severe microcytic anemia: - Due to chronic blood loss  and impaired absorption from underlying cirrhosis, splenomegaly and portal hypertension. - Paracentesis done with 5.18 L on 02/01/2018 and 4.8 L on 02/03/2017.  Last paracentesis on 06/21/2018 with 5.6 L removed.  He is taking Lasix 40 mg twice daily and Aldactone 100 mg twice daily. -Feraheme infusions on 02/03/2018, 03/11/2018 and 03/18/2018 and received 3 units of blood transfusion during the hospitalization.  Ferritin was 11 at that time. - Denies any bleeding per rectum or melena.  He is taking 1 iron tablet daily. -We reviewed the results of his blood work from 06/23/2018.  Hemoglobin was 13.4.  However ferritin dropped to 85 from 334 on 04/18/2018.  Percent saturation was 10.  I have recommended another infusion of Feraheme.  We will try to get free medication from the drug company. -he will follow-up with [place] in 4 months for repeat blood work.      Orders placed this encounter:  Orders Placed This Encounter  Procedures  . Lactate dehydrogenase  . CBC with Differential/Platelet  . Comprehensive metabolic panel  . Ferritin  . Iron and TIBC  . Vitamin B12  . Folate  . Lactate dehydrogenase  . CBC with Differential/Platelet  . Comprehensive metabolic panel  . Ferritin  . Iron and TIBC  . Vitamin B12  . Folate      Doreatha Massed, MD [facility] [phone]

## 2018-06-30 NOTE — Assessment & Plan Note (Signed)
1.  Severe microcytic anemia: - Due to chronic blood loss and impaired absorption from underlying cirrhosis, splenomegaly and portal hypertension. - Paracentesis done with 5.18 L on 02/01/2018 and 4.8 L on 02/03/2017.  Last paracentesis on 06/21/2018 with 5.6 L removed.  He is taking Lasix 40 mg twice daily and Aldactone 100 mg twice daily. -Feraheme infusions on 02/03/2018, 03/11/2018 and 03/18/2018 and received 3 units of blood transfusion during the hospitalization.  Ferritin was 11 at that time. - Denies any bleeding per rectum or melena.  He is taking 1 iron tablet daily. -We reviewed the results of his blood work from 06/23/2018.  Hemoglobin was 13.4.  However ferritin dropped to 85 from 334 on 04/18/2018.  Percent saturation was 10.  I have recommended another infusion of Feraheme.  We will try to get free medication from the drug company. -he will follow-up with [place] in 4 months for repeat blood work.

## 2018-06-30 NOTE — Patient Instructions (Signed)
[facility] Discharge Instructions     Thank you for choosing [facility] at [facility] to provide your oncology and hematology care.  To afford each patient quality time with our provider, please arrive at least 15 minutes before your scheduled appointment time.   If you have a lab appointment with the Cancer Center please come in thru the  Main Entrance and check in at the main information desk  You need to re-schedule your appointment should you arrive 10 or more minutes late.  We strive to give you quality time with our providers, and arriving late affects you and other patients whose appointments are after yours.  Also, if you no show three or more times for appointments you may be dismissed from the clinic at the providers discretion.     Again, thank you for choosing [facility].  Our hope is that these requests will decrease the amount of time that you wait before being seen by our physicians.       _____________________________________________________________  Should you have questions after your visit to [facility], please contact our office at [phone] between the hours of 8:00 a.m. and 4:30 p.m.  Voicemails left after 4:00 p.m. will not be returned until the following business day.  For prescription refill requests, have your pharmacy contact our office and allow 72 hours.    Cancer Center Support Programs:   > Cancer Support Group  2nd Tuesday of the month 1pm-2pm, Journey Room    

## 2018-07-01 NOTE — Assessment & Plan Note (Signed)
37 year old male with recently somewhat rapidly decompensating cirrhosis.  History of alcoholic cirrhosis.  He has been somewhat noncompliant with recommendations due to lack of insurance.  He does not qualify for [place] financial assistance [place]  He does have persistent ascites with paracentesis previously.  He has an umbilical hernia area that is started leaking peritoneal fluid.  He states the fluid started as amber clear but has changed somewhat to cloudy yellow.  He states that there is been a minimal odor to it as well.  This was examined with Dr. Darrick Penna and given no fevers no abdominal pain we will hold off on prophylactic antibiotics at this time.  I have recommended he recheck labs including CMP, INR, AFP.  I recommended strongly that he go for right upper quadrant ultrasound with Doppler studies to check for portal vein thrombosis as an etiology behind the increased pace of his decompensation.  We will call back with further recommendations pending study results.

## 2018-07-04 NOTE — Progress Notes (Signed)
No pcp per patient 

## 2018-07-04 NOTE — Addendum Note (Signed)
Addended by: Corrie Mckusick on: 07/04/2018 08:16 AM   Modules accepted: Orders

## 2018-07-04 NOTE — Telephone Encounter (Signed)
Referral faxed to [facility]. 

## 2018-07-07 ENCOUNTER — Other Ambulatory Visit (HOSPITAL_COMMUNITY): Payer: Self-pay | Admitting: Nurse Practitioner

## 2018-07-11 ENCOUNTER — Telehealth (HOSPITAL_COMMUNITY): Payer: Self-pay | Admitting: Hematology

## 2018-07-11 ENCOUNTER — Other Ambulatory Visit (HOSPITAL_COMMUNITY): Payer: Self-pay | Admitting: Nurse Practitioner

## 2018-07-11 DIAGNOSIS — I81 Portal vein thrombosis: Secondary | ICD-10-CM

## 2018-07-11 MED ORDER — RIVAROXABAN (XARELTO) VTE STARTER PACK (15 & 20 MG): ORAL_TABLET | ORAL | 0 refills | Status: DC

## 2018-07-11 NOTE — Telephone Encounter (Signed)
PC TO PT REQUESTING [id number] FOR J AND J APPLICATION AND AMAG APP(IRON). PT STATED HE MAILED IT TO ME  LAST WEEK. WILL WAIT TO GET INFO AND THEN SUBMIT APPS

## 2018-07-14 ENCOUNTER — Telehealth (HOSPITAL_COMMUNITY): Payer: Self-pay | Admitting: Hematology

## 2018-07-14 ENCOUNTER — Ambulatory Visit (HOSPITAL_COMMUNITY): Payer: Self-pay

## 2018-07-14 ENCOUNTER — Inpatient Hospital Stay (HOSPITAL_COMMUNITY): Payer: Medicaid - Out of State

## 2018-07-14 ENCOUNTER — Encounter (HOSPITAL_COMMUNITY): Payer: Self-pay

## 2018-07-14 DIAGNOSIS — D509 Iron deficiency anemia, unspecified: Secondary | ICD-10-CM | POA: Diagnosis not present

## 2018-07-14 MED ORDER — SODIUM CHLORIDE 0.9 % IV SOLN
510.0000 mg | Freq: Once | INTRAVENOUS | Status: AC
  Administered 2018-07-14: 510 mg via INTRAVENOUS
  Filled 2018-07-14: qty 17

## 2018-07-14 MED ORDER — SODIUM CHLORIDE 0.9 % IV SOLN
INTRAVENOUS | Status: DC
  Administered 2018-07-14: 13:00:00 via INTRAVENOUS

## 2018-07-14 NOTE — Patient Instructions (Signed)
[facility] Discharge Instructions  Received Feraheme infusion today. Follow-up as scheduled. Call clinic for any questions or concerns   Thank you for choosing [facility] at [facility] to provide your oncology and hematology care.  To afford each patient quality time with our provider, please arrive at least 15 minutes before your scheduled appointment time.   If you have a lab appointment with the Cancer Center please come in thru the  Main Entrance and check in at the main information desk  You need to re-schedule your appointment should you arrive 10 or more minutes late.  We strive to give you quality time with our providers, and arriving late affects you and other patients whose appointments are after yours.  Also, if you no show three or more times for appointments you may be dismissed from the clinic at the providers discretion.     Again, thank you for choosing [facility].  Our hope is that these requests will decrease the amount of time that you wait before being seen by our physicians.       _____________________________________________________________  Should you have questions after your visit to  Cancer Center, please contact our office at [phone] between the hours of 8:00 a.m. and 4:30 p.m.  Voicemails left after 4:00 p.m. will not be returned until the following business day.  For prescription refill requests, have your pharmacy contact our office and allow 72 hours.    Cancer Center Support Programs:   > Cancer Support Group  2nd Tuesday of the month 1pm-2pm, Journey Room   

## 2018-07-14 NOTE — Telephone Encounter (Signed)
PT APPROVED FOR FREE Duncan Dull BY AMAG ASSIT  07/13/18-07/13/19 [phone] P [phone] F  FAXED PRODUCT REQUEST FORM TO AMAG

## 2018-07-14 NOTE — Progress Notes (Signed)
Maze Culliton tolerated Feraheme infusion well without complaints or incident. VSS upon discharge. Peripheral IV site checked with positive blood return noted prior to and after this infusion. Pt discharged self ambulatory in satisfactory condition

## 2018-07-18 ENCOUNTER — Telehealth: Payer: Self-pay | Admitting: *Deleted

## 2018-07-18 NOTE — Telephone Encounter (Signed)
Called [facility] to f/u on referral. Was advised they have tried contacting patient and have not heard back from him.  Called home #-NA, called mobile # listed and was advised I had the wrong #. Letter mailed to patient advising to call [facility]. FYI to EG

## 2018-07-19 NOTE — Telephone Encounter (Signed)
See phone note for 07/18/18.

## 2018-07-21 NOTE — Telephone Encounter (Signed)
Noted. Thanks. Hopefully we will hear back from him soon.

## 2018-08-01 NOTE — Progress Notes (Signed)
Primary Care Physician:  Patient, No Pcp Per Primary GI: Dr. Darrick Penna   Chief Complaint  Patient presents with  . Cirrhosis    HPI:   Grant [name] is a 37 y.o. male presenting today with a history of ETOH cirrhosis. Followed by Hematology due to IDA. Needs EGD. Course complicated by ascites, umbilical drainage. Recent doppler study 06/30/18 with portal vein thrombosis. He has been referred to [facility], who has tried to contact him. He has not started on Xarelto. This was sent in 07/11/18/ He is to take 15 mg BID with food for 21 days. On day 22, switch to one 20 mg tablet once daily with food. Financial assistance has been started per Hematology and awaiting this. Last alcohol intake April 2019.   Umbilical drainage has resolved. No lower extremity edema. Remains on Lasix 40 mg BID and Aldactone 100 mg BID. Attempting to follow 2 gram sodium diet. No confusion or mental status changes. Feels abdomen is back to baseline. No abdominal pain.    July 2019: 217 Sept 2019: 251 Oct 2019: 211 Nov 2019: 222  Past Medical History:  Diagnosis Date  . Ascites 2013  . Avascular necrosis (HCC)   . Campylobacter diarrhea 04/2016   bloody diarrhea.   . 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 Hepatitis B and C serologies negative 06/2016.     Marland Kitchen Hypomagnesemia 01/31/2018  . Splenomegaly 2013  . Thrombocytopenia (HCC) 2013    Past Surgical History:  Procedure Laterality Date  . ESOPHAGOGASTRODUODENOSCOPY  08/2015   [place], Dr. Teena Dunk   . PARACENTESIS  2013   In [place] at [facility] associated hospital.     Current Outpatient Medications  Medication Sig Dispense Refill  . folic acid (FOLVITE) 1 MG tablet Take 1 tablet (1 mg total) by mouth daily. 30 tablet 1  . furosemide (LASIX) 40 MG tablet Take 1 tablet (40 mg total) by mouth 2 (two) times daily. 60 tablet 5  . Magnesium 250 MG TABS Take by mouth daily.    . Multiple Vitamin (MULTIVITAMIN)  tablet Take 1 tablet by mouth daily.    Marland Kitchen omeprazole (PRILOSEC) 20 MG capsule Take 1 capsule (20 mg total) by mouth 2 (two) times daily before a meal. 60 capsule 5  . propranolol (INDERAL) 20 MG tablet TAKE 1 TABLET BY MOUTH TWICE DAILY 60 tablet 5  . spironolactone (ALDACTONE) 100 MG tablet Take 1 tablet (100 mg total) by mouth 2 (two) times daily. 60 tablet 3  . thiamine 100 MG tablet Take 1 tablet (100 mg total) by mouth daily. 30 tablet 1  . Rivaroxaban 15 & 20 MG TBPK Take as directed on package: Start with one 15mg  tablet by mouth twice a day with food. On Day 22, switch to one 20mg  tablet once a day with food. (Patient not taking: Reported on 08/02/2018) 51 each 0   No current facility-administered medications for this visit.     Allergies as of 08/02/2018  . (No Known Allergies)    Family History  Problem Relation Age of Onset  . Diabetes Father   . Hypertension Father   . Colon polyps Father 33  . Diabetes Other   . Cancer Neg Hx   . Colon cancer Neg Hx   . Liver disease Neg Hx     Social History   Socioeconomic History  . Marital status: Single    Spouse name: Not on file  .  Number of children: Not on file  . Years of education: 2 to 3 yrs of colleg  . Highest education level: Not on file  Occupational History  . 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 Financial resource strain: Not on file  . Food insecurity:    Worry: Not on file    Inability: Not on file  . Transportation needs:    Medical: Not on file    Non-medical: Not on file  Tobacco Use  . Smoking status: Never Smoker  . Smokeless tobacco: Never Used  Substance and Sexual Activity  . Alcohol use: Not Currently    Alcohol/week: 20.0 standard drinks    Types: 20 Cans of beer per week    Comment: history of ETOH abuse, Last drank in April 2019 as of 06/29/18.  . Drug use: No  . Sexual activity: Not on file  Lifestyle  . Physical activity:    Days per  week: Not on file    Minutes per session: Not on file  . Stress: Not on file  Relationships  . Social connections:    Talks on phone: Not on file    Gets together: Not on file    Attends religious service: Not on file    Active member of club or organization: Not on file    Attends meetings of clubs or organizations: Not on file    Relationship status: Not on file  Other Topics Concern  . Not on file  Social History Narrative   Had nearly 1 year of college credit gained in high school AP classes.  Stayed in college for about 2 years, has about 3 yrs of college credits.     Review of Systems: Gen: Denies fever, chills, anorexia. Denies fatigue, weakness, weight loss.  CV: Denies chest pain, palpitations, syncope, peripheral edema, and claudication. Resp: Denies dyspnea at rest, cough, wheezing, coughing up blood, and pleurisy. GI: see HPI  Derm: Denies rash, itching, dry skin Psych: Denies depression, anxiety, memory loss, confusion. No homicidal or suicidal ideation.  Heme: Denies bruising, bleeding, and enlarged lymph nodes.  Physical Exam: BP 130/81   Pulse 77   Temp (!) 97 F (36.1 C) (Oral)   Ht 5\' 10"  (1.778 m)   Wt 222 lb 9.6 oz (101 kg)   BMI 31.94 kg/m  General:   Alert and oriented. No distress noted. Pleasant and cooperative.  Head:  Normocephalic and atraumatic. Eyes:  Conjuctiva clear without scleral icterus. Mouth:  Oral mucosa pink and moist.  Abdomen:  +BS, soft, obese, protruding umbilical hernia with fragile skin Msk:  Symmetrical without gross deformities. Normal posture. Extremities:  Without edema. Neurologic:  Alert and  oriented x4 Psych:  Alert and cooperative. Normal mood and affect.  Lab Results  Component Value Date   WBC 6.5 06/23/2018   HGB 13.4 06/23/2018   HCT 39.8 06/23/2018   MCV 98.0 06/23/2018   PLT 128 (L) 06/23/2018   Lab Results  Component Value Date   IRON 26 (L) 06/23/2018   TIBC 265 06/23/2018   FERRITIN 85 06/23/2018    Lab Results  Component Value Date   ALT 34 06/29/2018   AST 115 (H) 06/29/2018   ALKPHOS 199 (H) 06/29/2018   BILITOT 5.0 (H) 06/29/2018   Lab Results  Component Value Date   CREATININE 0.83 06/29/2018   BUN 8 06/29/2018   NA 134 (L) 06/29/2018   K 4.4 06/29/2018  CL 99 06/29/2018   CO2 25 06/29/2018

## 2018-08-02 ENCOUNTER — Encounter: Payer: Self-pay | Admitting: Gastroenterology

## 2018-08-02 ENCOUNTER — Ambulatory Visit (INDEPENDENT_AMBULATORY_CARE_PROVIDER_SITE_OTHER): Payer: Self-pay | Admitting: Gastroenterology

## 2018-08-02 ENCOUNTER — Telehealth: Payer: Self-pay | Admitting: Gastroenterology

## 2018-08-02 VITALS — BP 130/81 | HR 77 | Temp 97.0°F | Ht 70.0 in | Wt 222.6 lb

## 2018-08-02 DIAGNOSIS — K7031 Alcoholic cirrhosis of liver with ascites: Secondary | ICD-10-CM

## 2018-08-02 DIAGNOSIS — I81 Portal vein thrombosis: Secondary | ICD-10-CM

## 2018-08-02 NOTE — Patient Instructions (Signed)
Please call [facility] so you can have an appointment arranged.  You may start Xarelto. You will take 15 milligrams twice a day with food for 21 days. On day 22, you will take 20 milligram tablet once daily with food. We will try to figure out best assistance for this.  Continue lasix and aldactone twice a day.  I have completed the letter for you.  Please call if you start feeling fatigued, note any black,tarry stool or bright red blood in your stool. Seek medical attention if you start vomiting bright red blood or black emesis.   We will see you in January!  I enjoyed seeing you again today! As you know, I value our relationship and want to provide genuine, compassionate, and quality care. I welcome your feedback. If you receive a survey regarding your visit,  I greatly appreciate you taking time to fill this out. See you next time!  Gelene Mink, PhD, ANP-BC [facility]

## 2018-08-02 NOTE — Telephone Encounter (Signed)
Please let patient know that I spoke with Hematology. They are attempting financial assistance for Xarelto.

## 2018-08-02 NOTE — Assessment & Plan Note (Signed)
Refer to [facility] as previously outlined. He will contact them, as they have accepted. Lack of insurance remains in issue. Ascites resolved, no further umbilical drainage. No lower extremity edema. Continue Lasix 40 mg BID and aldactone 100 mg BID. Eventually will need EGD, but he has no insurance nor is he a candidate for [employer] as he lives in [place]. Return in Jan 2020. Continue close follow-up with Hematology.

## 2018-08-02 NOTE — Assessment & Plan Note (Signed)
Noted on recent doppler ultrasound. Hematology attempting patient assistance for Xarelto.

## 2018-08-02 NOTE — Progress Notes (Signed)
37 year old male with history of ETOH cirrhosis. Followed by Hematology due to IDA. Needs EGD. Course complicated by ascites, umbilical drainage. Recent doppler study 06/30/18 with portal vein thrombosis. He has been referred to [facility], who has tried to contact him. He has not started on Xarelto. This was sent in 07/11/18/ He is to take 15 mg BID with food for 21 days. On day 22, switch to one 20 mg tablet once daily with food.

## 2018-08-03 NOTE — Progress Notes (Signed)
No pcp per patient 

## 2018-08-03 NOTE — Telephone Encounter (Signed)
LMOM to call.

## 2018-08-04 ENCOUNTER — Other Ambulatory Visit (HOSPITAL_COMMUNITY): Payer: Self-pay | Admitting: *Deleted

## 2018-08-04 MED ORDER — XARELTO 20 MG PO TABS: 20.0000 mg | ORAL_TABLET | Freq: Every day | ORAL | 2 refills | Status: DC

## 2018-08-04 MED ORDER — XARELTO 15 MG PO TABS: 15.0000 mg | ORAL_TABLET | Freq: Two times a day (BID) | ORAL | 0 refills | Status: DC

## 2018-08-04 NOTE — Telephone Encounter (Signed)
We are working to get patient free medication and they need a prescription written for name brand only and for two prescriptions instead of the kit. New RX written and given to Dr. Delton Coombes to sign.

## 2018-08-05 NOTE — Telephone Encounter (Signed)
Patient aware.

## 2018-09-05 ENCOUNTER — Telehealth: Payer: Self-pay | Admitting: Gastroenterology

## 2018-09-05 NOTE — Telephone Encounter (Signed)
[phone] [id number] [place] [facility] [facility] and he wants to know what plans [facility] will participate with.  Do you know or have a number of someone he can speak with about it?

## 2018-09-05 NOTE — Telephone Encounter (Signed)
I spoke with the patient. 

## 2018-09-20 ENCOUNTER — Other Ambulatory Visit: Payer: Self-pay | Admitting: Gastroenterology

## 2018-09-26 ENCOUNTER — Encounter: Payer: Self-pay | Admitting: Gastroenterology

## 2018-09-26 NOTE — Progress Notes (Signed)
EGD last completed in Dec 2016 by Dr. 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

## 2018-10-11 ENCOUNTER — Ambulatory Visit (INDEPENDENT_AMBULATORY_CARE_PROVIDER_SITE_OTHER): Payer: Self-pay | Admitting: Gastroenterology

## 2018-10-11 ENCOUNTER — Encounter: Payer: Self-pay | Admitting: Gastroenterology

## 2018-10-11 VITALS — BP 137/76 | HR 105 | Temp 98.0°F | Ht 70.0 in | Wt 222.0 lb

## 2018-10-11 DIAGNOSIS — K7030 Alcoholic cirrhosis of liver without ascites: Secondary | ICD-10-CM

## 2018-10-11 DIAGNOSIS — I81 Portal vein thrombosis: Secondary | ICD-10-CM

## 2018-10-11 MED ORDER — OMEPRAZOLE 20 MG PO CPDR: DELAYED_RELEASE_CAPSULE | ORAL | 5 refills | Status: AC

## 2018-10-11 MED ORDER — SPIRONOLACTONE 100 MG PO TABS: 100.0000 mg | ORAL_TABLET | Freq: Two times a day (BID) | ORAL | 3 refills | Status: DC

## 2018-10-11 NOTE — Patient Instructions (Signed)
Keep upcoming appointment with Hematology and blood work.  I would like for you to restart propanolol 20 milligrams twice a day. The goal is for a resting heart rate of 55.   Continue lasix 40 milligrams twice a day and aldactone 100 milligrams twice a day.  I am referring you to Dr. Amado Coe to establish long-term care for cirrhosis as it will be in your network.  We wish you the best! Please call with any questions in meantime until you are officially set up with gastroenterology in [place].   I enjoyed seeing you again today! As you know, I value our relationship and want to provide genuine, compassionate, and quality care. I welcome your feedback. If you receive a survey regarding your visit,  I greatly appreciate you taking time to fill this out. See you next time!  Gelene Mink, PhD, ANP-BC [facility]

## 2018-10-11 NOTE — Progress Notes (Addendum)
Referring Provider: No ref. provider found Primary Care Physician:  Patient, No Pcp Per  Primary GI: Dr. Darrick Penna   Chief Complaint  Patient presents with  . Cirrhosis    HPI:   Grant [name] is a 38 y.o. male presenting today with a history of ETOH cirrhosis, IDA, portal vein thrombosis noted on doppler ultrasound Oct 2019 but has not started anticoagulation therapy. 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 He is overdue for surveillance and was inpatient last year with decompensated cirrhosis.   Last alcohol intake April 2019. Lasix 40 BID and aldactone 100 mg BID prescribed. Last para in Sept 2019 with 5.6 liters removed. There have been difficulties obtaining Xarelto due to paperwork. Blood work scheduled end of the month with Hematology.   No abdominal pain. Lower extremity edema improved. No confusion or mental status changes. No overt GI bleeding. Will need to continue care in [place] due to insurance.   He stopped propanolol on his own but understands he needs to resume this. Denies significant fatigue while taking.   Past Medical History:  Diagnosis Date  . Ascites 2013  . Avascular necrosis (HCC)   . Campylobacter diarrhea 04/2016   bloody diarrhea.   . 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 Hepatitis B and C serologies negative 06/2016.     Marland Kitchen Hypomagnesemia 01/31/2018  . Splenomegaly 2013  . Thrombocytopenia (HCC) 2013    Past Surgical History:  Procedure Laterality Date  . ESOPHAGOGASTRODUODENOSCOPY  08/2015   [place], Dr. 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 PARACENTESIS  2013   In [place] at [facility] associated hospital.     Current Outpatient Medications  Medication Sig Dispense Refill  . folic acid (FOLVITE) 1 MG tablet Take 1 tablet (1 mg total) by mouth daily. 30 tablet 1  . furosemide (LASIX) 40 MG  tablet Take 1 tablet (40 mg total) by mouth 2 (two) times daily. 60 tablet 5  . Magnesium 250 MG TABS Take by mouth daily.    . Multiple Vitamin (MULTIVITAMIN) tablet Take 1 tablet by mouth daily.    Marland Kitchen omeprazole (PRILOSEC) 20 MG capsule TAKE ONE CAPSULE BY MOUTH TWICE DAILY BEFORE A MEAL 180 capsule 5  . propranolol (INDERAL) 20 MG tablet TAKE 1 TABLET BY MOUTH TWICE DAILY (Patient taking differently: "weaning self off") 60 tablet 5  . spironolactone (ALDACTONE) 100 MG tablet Take 1 tablet (100 mg total) by mouth 2 (two) times daily for 30 days. 180 tablet 3  . thiamine 100 MG tablet Take 1 tablet (100 mg total) by mouth daily. 30 tablet 1   No current facility-administered medications for this visit.     Allergies as of 10/11/2018  . (No Known Allergies)    Family History  Problem Relation Age of Onset  . Diabetes Father   . Hypertension Father   . Colon polyps Father 53  . Diabetes Other   . Cancer Neg Hx   . Colon cancer Neg Hx   . Liver disease Neg Hx     Social History   Socioeconomic History  . Marital status: Single    Spouse name: Not on file  . Number of children: Not on file  . Years of education: 2 to 3 yrs of colleg  . Highest education level: Not on file  Occupational History  .  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 Financial resource strain: Not on file  . Food insecurity:    Worry: Not on file    Inability: Not on file  . Transportation needs:    Medical: Not on file    Non-medical: Not on file  Tobacco Use  . Smoking status: Never Smoker  . Smokeless tobacco: Never Used  Substance and Sexual Activity  . Alcohol use: Not Currently    Alcohol/week: 20.0 standard drinks    Types: 20 Cans of beer per week    Comment: history of ETOH abuse, Last drank in April 2019 as of 06/29/18.  . Drug use: No  . Sexual activity: Not on file  Lifestyle  . Physical activity:    Days per week: Not on file    Minutes per  session: Not on file  . Stress: Not on file  Relationships  . Social connections:    Talks on phone: Not on file    Gets together: Not on file    Attends religious service: Not on file    Active member of club or organization: Not on file    Attends meetings of clubs or organizations: Not on file    Relationship status: Not on file  Other Topics Concern  . Not on file  Social History Narrative   Had nearly 1 year of college credit gained in high school AP classes.  Stayed in college for about 2 years, has about 3 yrs of college credits.     Review of Systems: Gen: Denies fever, chills, anorexia. Denies fatigue, weakness, weight loss.  CV: Denies chest pain, palpitations, syncope, peripheral edema, and claudication. Resp: Denies dyspnea at rest, cough, wheezing, coughing up blood, and pleurisy. GI: see HPI Derm: Denies rash, itching, dry skin Psych: Denies depression, anxiety, memory loss, confusion. No homicidal or suicidal ideation.  Heme: Denies bruising, bleeding, and enlarged lymph nodes.  Physical Exam: BP 137/76   Pulse (!) 105   Temp 98 F (36.7 C) (Oral)   Ht 5\' 10"  (1.778 m)   Wt 222 lb (100.7 kg)   BMI 31.85 kg/m  General:   Alert and oriented. No distress noted. Pleasant and cooperative.  Head:  Normocephalic and atraumatic. Eyes:  Conjuctiva clear without scleral icterus. Mouth:  Oral mucosa pink and moist.  Abdomen:  +BS, obese, soft, non-tender and non-distended. No rebound or guarding. No HSM or masses noted. Msk:  Symmetrical without gross deformities. Normal posture. Extremities:  Without edema. Neurologic:  Alert and  oriented x4 Psych:  Alert and cooperative. Normal mood and affect.

## 2018-10-23 NOTE — Assessment & Plan Note (Signed)
Noted on ultrasound in Oct 2019 but difficulties starting anticoagulation due to lack of insurance. Keep appointment with Hematology upcoming.

## 2018-10-23 NOTE — Assessment & Plan Note (Signed)
38 year old male with history of ETOH use but sober since April 2019. Remains fairly well compensated on aldactone 100 mg BID and lasix 40 mg BID. Unfortunately, lack of insurance has remained an issue, but he now has coverage through [place]. Last ultrasound in Oct 2019. Needs to resume propanolol due to history of varices, prior variceal bleed. He is overdue for surveillance EGD with last in Dec 2016; he has been unable to pursue due to lack of insurance. We will be referring him to Dr. Amado Coe in [place]. Resume propanolol 20 mg BID. Keep upcoming appt with Hematology.

## 2018-10-24 NOTE — Progress Notes (Signed)
NO PCP PER PATIENT °

## 2018-10-28 ENCOUNTER — Inpatient Hospital Stay (HOSPITAL_COMMUNITY): Payer: Medicaid - Out of State | Attending: Hematology

## 2018-10-28 DIAGNOSIS — D509 Iron deficiency anemia, unspecified: Secondary | ICD-10-CM | POA: Insufficient documentation

## 2018-10-28 DIAGNOSIS — D649 Anemia, unspecified: Secondary | ICD-10-CM

## 2018-10-28 DIAGNOSIS — D696 Thrombocytopenia, unspecified: Secondary | ICD-10-CM

## 2018-10-28 LAB — COMPREHENSIVE METABOLIC PANEL
ALT: 19 U/L (ref 0–44)
AST: 47 U/L — AB (ref 15–41)
Albumin: 2.5 g/dL — ABNORMAL LOW (ref 3.5–5.0)
Alkaline Phosphatase: 118 U/L (ref 38–126)
Anion gap: 9 (ref 5–15)
BUN: 8 mg/dL (ref 6–20)
CO2: 22 mmol/L (ref 22–32)
CREATININE: 0.73 mg/dL (ref 0.61–1.24)
Calcium: 8.7 mg/dL — ABNORMAL LOW (ref 8.9–10.3)
Chloride: 97 mmol/L — ABNORMAL LOW (ref 98–111)
GFR calc Af Amer: >60 mL/min (ref >60)
GFR calc non Af Amer: >60 mL/min (ref >60)
GLUCOSE: 108 mg/dL — AB (ref 70–99)
Potassium: 4.1 mmol/L (ref 3.5–5.1)
Sodium: 128 mmol/L — ABNORMAL LOW (ref 135–145)
Total Bilirubin: 2.9 mg/dL — ABNORMAL HIGH (ref 0.3–1.2)
Total Protein: 7.7 g/dL (ref 6.5–8.1)

## 2018-10-28 LAB — CBC WITH DIFFERENTIAL/PLATELET
Abs Immature Granulocytes: 0.28 10*3/uL — ABNORMAL HIGH (ref 0.00–0.07)
BASOS ABS: 0.1 10*3/uL (ref 0.0–0.1)
BASOS PCT: 1 %
EOS ABS: 0.2 10*3/uL (ref 0.0–0.5)
Eosinophils Relative: 2 %
HCT: 32.9 % — ABNORMAL LOW (ref 39.0–52.0)
Hemoglobin: 10.7 g/dL — ABNORMAL LOW (ref 13.0–17.0)
Immature Granulocytes: 2 %
LYMPHS ABS: 1 10*3/uL (ref 0.7–4.0)
Lymphocytes Relative: 7 %
MCH: 33.4 pg (ref 26.0–34.0)
MCHC: 32.5 g/dL (ref 30.0–36.0)
MCV: 102.8 fL — ABNORMAL HIGH (ref 80.0–100.0)
MONO ABS: 1.6 10*3/uL — AB (ref 0.1–1.0)
MONOS PCT: 10 %
NEUTROS ABS: 12.1 10*3/uL — AB (ref 1.7–7.7)
NEUTROS PCT: 78 %
PLATELETS: 191 10*3/uL (ref 150–400)
RBC: 3.2 MIL/uL — ABNORMAL LOW (ref 4.22–5.81)
RDW: 15.7 % — AB (ref 11.5–15.5)
WBC: 15.3 10*3/uL — ABNORMAL HIGH (ref 4.0–10.5)
nRBC: 0 % (ref 0.0–0.2)

## 2018-10-28 LAB — FOLATE: Folate: 26.7 ng/mL (ref >5.9)

## 2018-10-28 LAB — FERRITIN: Ferritin: 536 ng/mL — ABNORMAL HIGH (ref 24–336)

## 2018-10-28 LAB — VITAMIN B12: Vitamin B-12: 1251 pg/mL — ABNORMAL HIGH (ref 180–914)

## 2018-10-28 LAB — IRON AND TIBC
Iron: 40 ug/dL — ABNORMAL LOW (ref 45–182)
Saturation Ratios: 28 % (ref 17.9–39.5)
TIBC: 145 ug/dL — ABNORMAL LOW (ref 250–450)
UIBC: 105 ug/dL

## 2018-10-28 LAB — LACTATE DEHYDROGENASE: LDH: 116 U/L (ref 98–192)

## 2018-11-04 ENCOUNTER — Encounter (HOSPITAL_COMMUNITY): Payer: Self-pay | Admitting: Hematology

## 2018-11-04 ENCOUNTER — Inpatient Hospital Stay (HOSPITAL_COMMUNITY): Payer: Medicaid - Out of State | Attending: Hematology | Admitting: Hematology

## 2018-11-04 ENCOUNTER — Other Ambulatory Visit: Payer: Self-pay

## 2018-11-04 DIAGNOSIS — K7460 Unspecified cirrhosis of liver: Secondary | ICD-10-CM | POA: Diagnosis not present

## 2018-11-04 DIAGNOSIS — D500 Iron deficiency anemia secondary to blood loss (chronic): Secondary | ICD-10-CM | POA: Diagnosis not present

## 2018-11-04 DIAGNOSIS — R5383 Other fatigue: Secondary | ICD-10-CM | POA: Insufficient documentation

## 2018-11-04 DIAGNOSIS — R040 Epistaxis: Secondary | ICD-10-CM | POA: Insufficient documentation

## 2018-11-04 DIAGNOSIS — R161 Splenomegaly, not elsewhere classified: Secondary | ICD-10-CM

## 2018-11-04 DIAGNOSIS — K766 Portal hypertension: Secondary | ICD-10-CM | POA: Diagnosis not present

## 2018-11-04 DIAGNOSIS — I81 Portal vein thrombosis: Secondary | ICD-10-CM | POA: Diagnosis not present

## 2018-11-04 DIAGNOSIS — D649 Anemia, unspecified: Secondary | ICD-10-CM

## 2018-11-04 DIAGNOSIS — K7030 Alcoholic cirrhosis of liver without ascites: Secondary | ICD-10-CM

## 2018-11-04 NOTE — Progress Notes (Signed)
[facility] [address] [place], [place] [address]   CLINIC:  Medical Oncology/Hematology  PCP:  Patient, No Pcp Per No address on file None   REASON FOR VISIT: Follow-up for severe microcytic anemia AND portal vein thrombosis   CURRENT THERAPY: intermittent iron infusion    INTERVAL HISTORY:  Grant [name] 38 y.o. [name] returns for routine follow-up severe microcytic anemia and portal vein thrombosis. He reports he has been doing well since his last visit. He is still sober since April 2019. He reports having mild nose bleeds from using a wood burning stove for heat. He reports no further bleeding. He is very fatigued during the day and is only able to do a few chores before he needs to rest or lay down. He had a follow up appointment with GI 3 weeks ago and they referred [place]. He reports he has not called them yet to make his appointment. He has not been on the blood thinner due to he never filled out all appropriate paperwork however he now has [place] medicaid and wishes [facility] hematologist. Denies any nausea, vomiting, or diarrhea. Denies any new pains. Had not noticed any recent bleeding such as epistaxis, hematuria or hematochezia. Denies recent chest pain on exertion, shortness of breath on minimal exertion, pre-syncopal episodes, or palpitations. Denies any numbness or tingling in hands or feet. Denies any recent fevers, infections, or recent hospitalizations. Patient reports appetite at 100% and energy level at 75%. He is eating well and has no problems maintaining his weight.     REVIEW OF SYSTEMS:  Review of Systems  Constitutional: Positive for fatigue.  HENT:   Positive for nosebleeds.   All other systems reviewed and are negative.    PAST MEDICAL/SURGICAL HISTORY:  Past Medical History:  Diagnosis Date  . Ascites 2013  . Avascular necrosis (HCC)   . Campylobacter diarrhea 04/2016   bloody diarrhea.   .  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 Hepatitis B and C serologies negative 06/2016.     Marland Kitchen. Hypomagnesemia 01/31/2018  . Splenomegaly 2013  . Thrombocytopenia (HCC) 2013   Past Surgical History:  Procedure Laterality Date  . ESOPHAGOGASTRODUODENOSCOPY  08/2015   [place], Dr. 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 PARACENTESIS  2013   [facility] [place] [facility].      SOCIAL HISTORY:  Social History   Socioeconomic History  . Marital status: Single    Spouse name: Not on file  . Number of children: Not on file  . Years of education: 2 to 3 yrs of colleg  . Highest education level: Not on file  Occupational History  . 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 Financial resource strain: Not on file  . Food insecurity:    Worry: Not on file    Inability: Not on file  . Transportation needs:    Medical: Not on file    Non-medical: Not on file  Tobacco Use  . Smoking status: Never Smoker  . Smokeless tobacco: Never Used  Substance and Sexual Activity  . Alcohol use: Not Currently    Alcohol/week: 20.0 standard drinks    Types: 20 Cans of beer per week    Comment: history of ETOH abuse, Last drank in April 2019 as of  06/29/18.  . Drug use: No  . Sexual activity: Not on file  Lifestyle  . Physical activity:    Days per week: Not on file    Minutes per session: Not on file  . Stress: Not on file  Relationships  . Social connections:    Talks on phone: Not on file    Gets together: Not on file    Attends religious service: Not on file    Active member of club or organization: Not on file    Attends meetings of clubs or organizations: Not on file    Relationship status: Not on file  . Intimate partner violence:    Fear of current or ex partner: Not on file    Emotionally abused: Not on file    Physically abused: Not on file    Forced  sexual activity: Not on file  Other Topics Concern  . Not on file  Social History Narrative   Had nearly 1 year of college credit gained in high school AP classes.  Stayed in college for about 2 years, has about 3 yrs of college credits.     FAMILY HISTORY:  Family History  Problem Relation Age of Onset  . Diabetes Father   . Hypertension Father   . Colon polyps Father 4778  . Diabetes Other   . Cancer Neg Hx   . Colon cancer Neg Hx   . Liver disease Neg Hx     CURRENT MEDICATIONS:  Outpatient Encounter Medications as of 11/04/2018  Medication Sig  . folic acid (FOLVITE) 1 MG tablet Take 1 tablet (1 mg total) by mouth daily.  . furosemide (LASIX) 40 MG tablet Take 1 tablet (40 mg total) by mouth 2 (two) times daily.  . Magnesium 250 MG TABS Take by mouth daily.  . Multiple Vitamin (MULTIVITAMIN) tablet Take 1 tablet by mouth daily.  Marland Kitchen. omeprazole (PRILOSEC) 20 MG capsule TAKE ONE CAPSULE BY MOUTH TWICE DAILY BEFORE A MEAL  . propranolol (INDERAL) 20 MG tablet TAKE 1 TABLET BY MOUTH TWICE DAILY (Patient taking differently: "weaning self off")  . spironolactone (ALDACTONE) 100 MG tablet Take 1 tablet (100 mg total) by mouth 2 (two) times daily for 30 days.  Marland Kitchen. thiamine 100 MG tablet Take 1 tablet (100 mg total) by mouth daily.   No facility-administered encounter medications on file as of 11/04/2018.     ALLERGIES:  No Known Allergies   PHYSICAL EXAM:  ECOG Performance status: 1  Vitals:   11/04/18 1100  BP: (!) 147/69  Pulse: 92  Resp: 16  Temp: 98.1 F (36.7 C)  SpO2: 93%   Filed Weights   11/04/18 1100  Weight: 221 lb 8 oz (100.5 kg)    Physical Exam Constitutional:      Appearance: Normal appearance. He is normal weight.  Cardiovascular:     Rate and Rhythm: Normal rate and regular rhythm.     Heart sounds: Normal heart sounds.  Pulmonary:     Effort: Pulmonary effort is normal.     Breath sounds: Normal breath sounds.  Musculoskeletal: Normal range of  motion.  Skin:    General: Skin is warm and dry.  Neurological:     Mental Status: He is alert and oriented to person, place, and time. Mental status is at baseline.  Psychiatric:        Mood and Affect: Mood normal.        Behavior: Behavior normal.        Thought  Content: Thought content normal.        Judgment: Judgment normal.   Abdomen: Positive for ascites.   Extremities: 2+ edema bilateral.   LABORATORY DATA:  I have reviewed the labs as listed.  CBC    Component Value Date/Time   WBC 15.3 (H) 10/28/2018 1108   RBC 3.20 (L) 10/28/2018 1108   HGB 10.7 (L) 10/28/2018 1108   HCT 32.9 (L) 10/28/2018 1108   PLT 191 10/28/2018 1108   MCV 102.8 (H) 10/28/2018 1108   MCH 33.4 10/28/2018 1108   MCHC 32.5 10/28/2018 1108   RDW 15.7 (H) 10/28/2018 1108   LYMPHSABS 1.0 10/28/2018 1108   MONOABS 1.6 (H) 10/28/2018 1108   EOSABS 0.2 10/28/2018 1108   BASOSABS 0.1 10/28/2018 1108   CMP Latest Ref Rng & Units 10/28/2018 06/29/2018 06/27/2018  Glucose 70 - 99 mg/dL 831(D) 93 176(H)  BUN 6 - 20 mg/dL 8 8 5(L)  Creatinine 6.07 - 1.24 mg/dL 3.71 0.62 6.94(W)  Sodium 135 - 145 mmol/L 128(L) 134(L) 134(L)  Potassium 3.5 - 5.1 mmol/L 4.1 4.4 3.7  Chloride 98 - 111 mmol/L 97(L) 99 99  CO2 22 - 32 mmol/L 22 25 26   Calcium 8.9 - 10.3 mg/dL 5.4(O) 8.9 2.7(O)  Total Protein 6.5 - 8.1 g/dL 7.7 8.0 -  Total Bilirubin 0.3 - 1.2 mg/dL 2.9(H) 5.0(H) -  Alkaline Phos 38 - 126 U/L 118 199(H) -  AST 15 - 41 U/L 47(H) 115(H) -  ALT 0 - 44 U/L 19 34 -       DIAGNOSTIC IMAGING:  I have independently reviewed the scans and discussed with the patient.   I have reviewed Mathis Bud, NP's note and agree with the documentation.  I personally performed a face-to-face visit, made revisions and my assessment and plan is as follows.    ASSESSMENT & PLAN:   Symptomatic anemia 1.  Severe microcytic anemia: - Due to chronic blood loss and impaired absorption from underlying cirrhosis,  splenomegaly and portal hypertension. - Paracentesis done with 5.18 L on 02/01/2018 and 4.8 L on 02/03/2017.  Last paracentesis on 06/21/2018 with 5.6 L removed.  He is taking Lasix 40 mg twice daily and Aldactone 100 mg twice daily. -Feraheme infusions on 02/03/2018, 03/11/2018 and 03/18/2018 and received 3 units of blood transfusion during the hospitalization.  He also received Feraheme on 07/14/2018. - Denies any recent bleeding per rectum or melena.  Reportedly got to [place]. -We went over his blood work.  Hemoglobin is 10.7.  Ferritin is above 500.  He does not require any parenteral iron therapy at this time.  He lives in [place].  We will make a referral to [facility] oncology in [place].  2.  Portal vein thrombus: - Ultrasound of the liver with Doppler on 06/30/2018 showed complete thrombosis of the portal vein, hepatic cirrhosis and splenomegaly. -I have recommended anticoagulation.  We have tried to get him Xarelto through drug company.  But he did not submit the paperwork. -He is also in the process of being referred to liver transplant center by hepatology team.      Orders placed this encounter:  No orders of the defined types were placed in this encounter.     Doreatha Massed, MD [facility] [phone]

## 2018-11-04 NOTE — Assessment & Plan Note (Signed)
1.  Severe microcytic anemia: - Due to chronic blood loss and impaired absorption from underlying cirrhosis, splenomegaly and portal hypertension. - Paracentesis done with 5.18 L on 02/01/2018 and 4.8 L on 02/03/2017.  Last paracentesis on 06/21/2018 with 5.6 L removed.  He is taking Lasix 40 mg twice daily and Aldactone 100 mg twice daily. -Feraheme infusions on 02/03/2018, 03/11/2018 and 03/18/2018 and received 3 units of blood transfusion during the hospitalization.  He also received Feraheme on 07/14/2018. - Denies any recent bleeding per rectum or melena.  Reportedly got to [place]. -We went over his blood work.  Hemoglobin is 10.7.  Ferritin is above 500.  He does not require any parenteral iron therapy at this time.  He lives in [place].  We will make a referral to [facility] oncology in [place].  2.  Portal vein thrombus: - Ultrasound of the liver with Doppler on 06/30/2018 showed complete thrombosis of the portal vein, hepatic cirrhosis and splenomegaly. -I have recommended anticoagulation.  We have tried to get him Xarelto through drug company.  But he did not submit the paperwork. -He is also in the process of being referred to liver transplant center by hepatology team.

## 2018-11-04 NOTE — Patient Instructions (Signed)
[facility] Discharge Instructions     Thank you for choosing [facility] at [facility] to provide your oncology and hematology care.  To afford each patient quality time with our provider, please arrive at least 15 minutes before your scheduled appointment time.   If you have a lab appointment with the Cancer Center please come in thru the  Main Entrance and check in at the main information desk  You need to re-schedule your appointment should you arrive 10 or more minutes late.  We strive to give you quality time with our providers, and arriving late affects you and other patients whose appointments are after yours.  Also, if you no show three or more times for appointments you may be dismissed from the clinic at the providers discretion.     Again, thank you for choosing [facility].  Our hope is that these requests will decrease the amount of time that you wait before being seen by our physicians.       _____________________________________________________________  Should you have questions after your visit to  Cancer Center, please contact our office at [phone] between the hours of 8:00 a.m. and 4:30 p.m.  Voicemails left after 4:00 p.m. will not be returned until the following business day.  For prescription refill requests, have your pharmacy contact our office and allow 72 hours.    Cancer Center Support Programs:   > Cancer Support Group  2nd Tuesday of the month 1pm-2pm, Journey Room    

## 2018-11-09 ENCOUNTER — Encounter (HOSPITAL_COMMUNITY): Payer: Self-pay | Admitting: Hematology

## 2018-11-09 NOTE — Progress Notes (Signed)
Transfer of care to [place], [place].  Faxed patient records to [phone]

## 2018-11-21 ENCOUNTER — Telehealth: Payer: Self-pay | Admitting: Gastroenterology

## 2018-11-21 DIAGNOSIS — K7030 Alcoholic cirrhosis of liver without ascites: Secondary | ICD-10-CM

## 2018-11-21 NOTE — Telephone Encounter (Signed)
[phone]  PATIENT CALLED AND SAID WE WERE SUPPOSED TO REFER HIM TO A GI DOCTOR IN [place] DUE TO HIS INSURANCE   PLEASE CALL

## 2018-11-22 ENCOUNTER — Encounter: Payer: Self-pay | Admitting: Gastroenterology

## 2018-11-22 NOTE — Telephone Encounter (Signed)
Needs to be referred to Dr. Amado Coe with [facility] in [place]. The letter is done, which can be sent with his office notes.

## 2018-11-22 NOTE — Addendum Note (Signed)
Addended by: Corrie Mckusick on: 11/22/2018 02:34 PM   Modules accepted: Orders

## 2018-11-22 NOTE — Telephone Encounter (Signed)
Referral info should be on encounter form. Please go ahead and start this process. Do not need to wait for my letter, as this was just a courtesy to let them know his background. Letter will be done this week, but referral needs to be in process. Thanks!

## 2018-11-22 NOTE — Telephone Encounter (Signed)
Referral and letter faxed to Dr. Amado Coe.

## 2018-12-02 ENCOUNTER — Other Ambulatory Visit: Payer: Self-pay | Admitting: Gastroenterology

## 2018-12-08 NOTE — Telephone Encounter (Signed)
On 11/28/2018 Dr. Rob Hickman office called pt and LMTCB but they have not heard back from him. Called pt and LMOVM to call Dr. Rob Hickman office and provided number to call

## 2018-12-09 ENCOUNTER — Telehealth: Payer: Self-pay | Admitting: Gastroenterology

## 2018-12-09 NOTE — Telephone Encounter (Signed)
Patient called and said he finally got in touch with the GI we referred him to

## 2018-12-09 NOTE — Telephone Encounter (Signed)
Called Dr. Rob Hickman office, he has an appt 01/12/19 at 11:10am.

## 2018-12-09 NOTE — Telephone Encounter (Signed)
Noted  

## 2018-12-09 NOTE — Telephone Encounter (Signed)
Pt has an appt 01/12/19 at 11:10am at Dr. Rob Hickman office.

## 2019-01-11 ENCOUNTER — Telehealth: Payer: Self-pay

## 2019-01-11 NOTE — Telephone Encounter (Signed)
Dr. Rob Hickman office called requesting lab results for referral. Lab results from 10/28/18 faxed to [phone].

## 2019-01-21 ENCOUNTER — Other Ambulatory Visit: Payer: Self-pay

## 2019-01-21 ENCOUNTER — Emergency Department (HOSPITAL_COMMUNITY): Payer: Medicaid - Out of State

## 2019-01-21 ENCOUNTER — Encounter (HOSPITAL_COMMUNITY): Payer: Self-pay | Admitting: Emergency Medicine

## 2019-01-21 ENCOUNTER — Inpatient Hospital Stay (HOSPITAL_COMMUNITY)
Admission: EM | Admit: 2019-01-21 | Discharge: 2019-02-02 | DRG: 177 | Disposition: A | Discharge status: Home / Self Care | Payer: Medicaid - Out of State | Source: Ambulatory Visit | Attending: Family Medicine | Admitting: Family Medicine

## 2019-01-21 ENCOUNTER — Telehealth: Payer: Self-pay | Admitting: Gastroenterology

## 2019-01-21 DIAGNOSIS — D509 Iron deficiency anemia, unspecified: Secondary | ICD-10-CM | POA: Diagnosis not present

## 2019-01-21 DIAGNOSIS — Z8249 Family history of ischemic heart disease and other diseases of the circulatory system: Secondary | ICD-10-CM

## 2019-01-21 DIAGNOSIS — J9601 Acute respiratory failure with hypoxia: Secondary | ICD-10-CM | POA: Diagnosis present

## 2019-01-21 DIAGNOSIS — J90 Pleural effusion, not elsewhere classified: Secondary | ICD-10-CM

## 2019-01-21 DIAGNOSIS — K429 Umbilical hernia without obstruction or gangrene: Secondary | ICD-10-CM | POA: Diagnosis present

## 2019-01-21 DIAGNOSIS — R161 Splenomegaly, not elsewhere classified: Secondary | ICD-10-CM | POA: Diagnosis present

## 2019-01-21 DIAGNOSIS — Z6831 Body mass index (BMI) 31.0-31.9, adult: Secondary | ICD-10-CM

## 2019-01-21 DIAGNOSIS — E876 Hypokalemia: Secondary | ICD-10-CM | POA: Diagnosis present

## 2019-01-21 DIAGNOSIS — J181 Lobar pneumonia, unspecified organism: Secondary | ICD-10-CM

## 2019-01-21 DIAGNOSIS — Z20828 Contact with and (suspected) exposure to other viral communicable diseases: Secondary | ICD-10-CM | POA: Diagnosis not present

## 2019-01-21 DIAGNOSIS — Z833 Family history of diabetes mellitus: Secondary | ICD-10-CM

## 2019-01-21 DIAGNOSIS — N179 Acute kidney failure, unspecified: Secondary | ICD-10-CM | POA: Diagnosis not present

## 2019-01-21 DIAGNOSIS — J189 Pneumonia, unspecified organism: Secondary | ICD-10-CM

## 2019-01-21 DIAGNOSIS — D72829 Elevated white blood cell count, unspecified: Secondary | ICD-10-CM | POA: Diagnosis not present

## 2019-01-21 DIAGNOSIS — N170 Acute kidney failure with tubular necrosis: Secondary | ICD-10-CM | POA: Diagnosis not present

## 2019-01-21 DIAGNOSIS — E871 Hypo-osmolality and hyponatremia: Secondary | ICD-10-CM | POA: Diagnosis present

## 2019-01-21 DIAGNOSIS — J869 Pyothorax without fistula: Secondary | ICD-10-CM

## 2019-01-21 DIAGNOSIS — K7040 Alcoholic hepatic failure without coma: Secondary | ICD-10-CM | POA: Diagnosis not present

## 2019-01-21 DIAGNOSIS — Z9889 Other specified postprocedural states: Secondary | ICD-10-CM

## 2019-01-21 DIAGNOSIS — Z86718 Personal history of other venous thrombosis and embolism: Secondary | ICD-10-CM

## 2019-01-21 DIAGNOSIS — R0902 Hypoxemia: Secondary | ICD-10-CM

## 2019-01-21 DIAGNOSIS — Z7901 Long term (current) use of anticoagulants: Secondary | ICD-10-CM

## 2019-01-21 DIAGNOSIS — Z79899 Other long term (current) drug therapy: Secondary | ICD-10-CM

## 2019-01-21 DIAGNOSIS — I10 Essential (primary) hypertension: Secondary | ICD-10-CM | POA: Diagnosis not present

## 2019-01-21 DIAGNOSIS — E8770 Fluid overload, unspecified: Secondary | ICD-10-CM | POA: Diagnosis present

## 2019-01-21 DIAGNOSIS — E669 Obesity, unspecified: Secondary | ICD-10-CM | POA: Diagnosis present

## 2019-01-21 DIAGNOSIS — R319 Hematuria, unspecified: Secondary | ICD-10-CM | POA: Diagnosis present

## 2019-01-21 DIAGNOSIS — Z8371 Family history of colonic polyps: Secondary | ICD-10-CM

## 2019-01-21 DIAGNOSIS — R188 Other ascites: Secondary | ICD-10-CM

## 2019-01-21 DIAGNOSIS — J918 Pleural effusion in other conditions classified elsewhere: Secondary | ICD-10-CM | POA: Diagnosis present

## 2019-01-21 DIAGNOSIS — K219 Gastro-esophageal reflux disease without esophagitis: Secondary | ICD-10-CM | POA: Diagnosis not present

## 2019-01-21 DIAGNOSIS — K7031 Alcoholic cirrhosis of liver with ascites: Secondary | ICD-10-CM | POA: Diagnosis present

## 2019-01-21 DIAGNOSIS — J851 Abscess of lung with pneumonia: Principal | ICD-10-CM | POA: Diagnosis present

## 2019-01-21 DIAGNOSIS — R42 Dizziness and giddiness: Secondary | ICD-10-CM | POA: Diagnosis present

## 2019-01-21 DIAGNOSIS — D6959 Other secondary thrombocytopenia: Secondary | ICD-10-CM | POA: Diagnosis not present

## 2019-01-21 DIAGNOSIS — K760 Fatty (change of) liver, not elsewhere classified: Secondary | ICD-10-CM | POA: Diagnosis present

## 2019-01-21 DIAGNOSIS — R791 Abnormal coagulation profile: Secondary | ICD-10-CM | POA: Diagnosis present

## 2019-01-21 DIAGNOSIS — J155 Pneumonia due to Escherichia coli: Secondary | ICD-10-CM | POA: Diagnosis present

## 2019-01-21 DIAGNOSIS — E872 Acidosis, unspecified: Secondary | ICD-10-CM | POA: Diagnosis present

## 2019-01-21 DIAGNOSIS — R0602 Shortness of breath: Secondary | ICD-10-CM

## 2019-01-21 DIAGNOSIS — M549 Dorsalgia, unspecified: Secondary | ICD-10-CM | POA: Diagnosis not present

## 2019-01-21 DIAGNOSIS — N19 Unspecified kidney failure: Secondary | ICD-10-CM

## 2019-01-21 DIAGNOSIS — R180 Malignant ascites: Secondary | ICD-10-CM

## 2019-01-21 DIAGNOSIS — E8809 Other disorders of plasma-protein metabolism, not elsewhere classified: Secondary | ICD-10-CM | POA: Diagnosis present

## 2019-01-21 DIAGNOSIS — Z4682 Encounter for fitting and adjustment of non-vascular catheter: Secondary | ICD-10-CM

## 2019-01-21 HISTORY — DX: Gastro-esophageal reflux disease without esophagitis: K21.9

## 2019-01-21 LAB — CBC WITH DIFFERENTIAL/PLATELET
Abs Immature Granulocytes: 0.42 10*3/uL — ABNORMAL HIGH (ref 0.00–0.07)
Basophils Absolute: 0.1 10*3/uL (ref 0.0–0.1)
Basophils Relative: 1 %
Eosinophils Absolute: 0.1 10*3/uL (ref 0.0–0.5)
Eosinophils Relative: 1 %
HCT: 33.2 % — ABNORMAL LOW (ref 39.0–52.0)
Hemoglobin: 11.8 g/dL — ABNORMAL LOW (ref 13.0–17.0)
Immature Granulocytes: 3 %
Lymphocytes Relative: 9 %
Lymphs Abs: 1.5 10*3/uL (ref 0.7–4.0)
MCH: 34.1 pg — ABNORMAL HIGH (ref 26.0–34.0)
MCHC: 35.5 g/dL (ref 30.0–36.0)
MCV: 96 fL (ref 80.0–100.0)
Monocytes Absolute: 1.3 10*3/uL — ABNORMAL HIGH (ref 0.1–1.0)
Monocytes Relative: 8 %
Neutro Abs: 13.6 10*3/uL — ABNORMAL HIGH (ref 1.7–7.7)
Neutrophils Relative %: 78 %
Platelets: 171 10*3/uL (ref 150–400)
RBC: 3.46 MIL/uL — ABNORMAL LOW (ref 4.22–5.81)
RDW: 14 % (ref 11.5–15.5)
WBC: 17 10*3/uL — ABNORMAL HIGH (ref 4.0–10.5)
nRBC: 0 % (ref 0.0–0.2)

## 2019-01-21 LAB — URINALYSIS, ROUTINE W REFLEX MICROSCOPIC
Bilirubin Urine: NEGATIVE
Glucose, UA: NEGATIVE mg/dL
Ketones, ur: NEGATIVE mg/dL
Nitrite: NEGATIVE
Protein, ur: NEGATIVE mg/dL
RBC / HPF: >50 RBC/hpf — ABNORMAL HIGH (ref 0–5)
Specific Gravity, Urine: 1.012 (ref 1.005–1.030)
pH: 5 (ref 5.0–8.0)

## 2019-01-21 LAB — PROTIME-INR
INR: 6.1 (ref 0.8–1.2)
Prothrombin Time: 53.4 seconds — ABNORMAL HIGH (ref 11.4–15.2)

## 2019-01-21 LAB — COMPREHENSIVE METABOLIC PANEL
ALT: 41 U/L (ref 0–44)
AST: 127 U/L — ABNORMAL HIGH (ref 15–41)
Albumin: 2.2 g/dL — ABNORMAL LOW (ref 3.5–5.0)
Alkaline Phosphatase: 170 U/L — ABNORMAL HIGH (ref 38–126)
Anion gap: 11 (ref 5–15)
BUN: 7 mg/dL (ref 6–20)
CO2: 22 mmol/L (ref 22–32)
Calcium: 7.9 mg/dL — ABNORMAL LOW (ref 8.9–10.3)
Chloride: 88 mmol/L — ABNORMAL LOW (ref 98–111)
Creatinine, Ser: 0.6 mg/dL — ABNORMAL LOW (ref 0.61–1.24)
GFR calc Af Amer: >60 mL/min (ref >60)
GFR calc non Af Amer: >60 mL/min (ref >60)
Glucose, Bld: 117 mg/dL — ABNORMAL HIGH (ref 70–99)
Potassium: 3.4 mmol/L — ABNORMAL LOW (ref 3.5–5.1)
Sodium: 121 mmol/L — ABNORMAL LOW (ref 135–145)
Total Bilirubin: 2.9 mg/dL — ABNORMAL HIGH (ref 0.3–1.2)
Total Protein: 8.5 g/dL — ABNORMAL HIGH (ref 6.5–8.1)

## 2019-01-21 LAB — SARS CORONAVIRUS 2 BY RT PCR (HOSPITAL ORDER, PERFORMED IN ~~LOC~~ HOSPITAL LAB): SARS Coronavirus 2: NEGATIVE

## 2019-01-21 LAB — LIPASE, BLOOD: Lipase: 37 U/L (ref 11–51)

## 2019-01-21 LAB — BRAIN NATRIURETIC PEPTIDE: B Natriuretic Peptide: 318 pg/mL — ABNORMAL HIGH (ref 0.0–100.0)

## 2019-01-21 LAB — LACTIC ACID, PLASMA: Lactic Acid, Venous: 2.6 mmol/L (ref 0.5–1.9)

## 2019-01-21 MED ORDER — VANCOMYCIN HCL IN DEXTROSE 1-5 GM/200ML-% IV SOLN
1000.0000 mg | Freq: Two times a day (BID) | INTRAVENOUS | Status: DC
  Administered 2019-01-22 – 2019-01-23 (×3): 1000 mg via INTRAVENOUS
  Filled 2019-01-21 (×4): qty 200

## 2019-01-21 MED ORDER — VANCOMYCIN HCL IN DEXTROSE 1-5 GM/200ML-% IV SOLN
1000.0000 mg | INTRAVENOUS | Status: AC
  Administered 2019-01-21 (×2): 1000 mg via INTRAVENOUS
  Filled 2019-01-21 (×2): qty 200

## 2019-01-21 MED ORDER — IOHEXOL 300 MG/ML  SOLN
100.0000 mL | Freq: Once | INTRAMUSCULAR | Status: AC | PRN
  Administered 2019-01-21: 100 mL via INTRAVENOUS

## 2019-01-21 MED ORDER — SODIUM CHLORIDE 0.9 % IV SOLN
1.0000 g | Freq: Once | INTRAVENOUS | Status: AC
  Administered 2019-01-21: 1 g via INTRAVENOUS
  Filled 2019-01-21: qty 1

## 2019-01-21 MED ORDER — LACTATED RINGERS IV BOLUS
500.0000 mL | Freq: Once | INTRAVENOUS | Status: AC
  Administered 2019-01-21: 500 mL via INTRAVENOUS

## 2019-01-21 MED ORDER — POTASSIUM CHLORIDE CRYS ER 20 MEQ PO TBCR
40.0000 meq | EXTENDED_RELEASE_TABLET | Freq: Two times a day (BID) | ORAL | Status: DC
  Administered 2019-01-21 – 2019-01-29 (×17): 40 meq via ORAL
  Filled 2019-01-21 (×18): qty 2

## 2019-01-21 NOTE — ED Notes (Addendum)
Date and time results received: 01/21/19 1745  Test: Lactic Acid Critical Value: 2.6  Name of Provider Notified: Dr. Deretha Emory  Orders Received? Or Actions Taken?: See chart

## 2019-01-21 NOTE — H&P (Signed)
History and Physical  Grant [name] ZOX:[id number] DOB: 1981-05-15 DOA: 01/21/2019  Referring physician: Dr Deretha Emory, ED physician PCP: Patient, No Pcp Per  Outpatient Specialists:   Patient Coming From: home  Chief Complaint: SOB, cough  HPI: Grant [name] is a 38 y.o. male with a history of alcoholic cirrhosis, portal vein thrombosis and 2019 with recent start on Xarelto, severe anemia, GERD, splenomegaly.  Patient seen for worsening shortness of breath over the past 3 weeks.  At that time, the patient was treated for pneumonia with an antibiotic, although he does not remember which one.  Patient felt a little better but returned to being worse again.  He called in to Dr. Andrey Campanile fields, who referred him into the hospital for evaluation.  Shortness of breath increases with ambulation and exertion and improved with rest.  He has productive cough with purulent sputum.  He was tested approximately 3 weeks ago for COVID-19.  This test was not in our system and is reported to be negative.  No other palliating or provoking factors.  Emergency Department Course: Chest x-ray shows right lower lobe pneumonia.  Chest CT without contrast shows right lower lobe pneumonia with loculated pleuritic fluid, likely empyema.  Patient received vancomycin and cefepime.  Patient was retested for COVID-19, which was negative.  White count 17 with ANC of 13.6.  Lactic acid 2.6.  Sodium 121, potassium 3.4, creatinine 0.6.  INR 6.1  Review of Systems:   Pt denies any fevers, chills, nausea, vomiting, diarrhea, constipation, abdominal pain, headache, vision changes, lightheadedness, dizziness, melena, rectal bleeding.  Review of systems are otherwise negative  Past Medical History:  Diagnosis Date  . Ascites 2013  . Avascular necrosis (HCC)   . Campylobacter diarrhea 04/2016   bloody diarrhea.   . 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 Hepatitis B and C serologies negative  06/2016.     Marland Kitchen Hypomagnesemia 01/31/2018  . Splenomegaly 2013  . Thrombocytopenia (HCC) 2013   Past Surgical History:  Procedure Laterality Date  . ESOPHAGOGASTRODUODENOSCOPY  08/2015   [place], Dr. 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 PARACENTESIS  2013   In [place] at [facility].    Social History:  reports that he has never smoked. He has never used smokeless tobacco. He reports previous alcohol use of about 20.0 standard drinks of alcohol per week. He reports that he does not use drugs. Patient lives at home  No Known Allergies  Family History  Problem Relation Age of Onset  . Diabetes Father   . Hypertension Father   . Colon polyps Father 33  . Diabetes Other   . Cancer Neg Hx   . Colon cancer Neg Hx   . Liver disease Neg Hx       Prior to Admission medications   Medication Sig Start Date End Date Taking? Authorizing Provider  folic acid (FOLVITE) 1 MG tablet Take 1 tablet (1 mg total) by mouth daily. 05/10/16  Yes Vassie Loll, MD  furosemide (LASIX) 40 MG tablet TAKE 1 TABLET BY MOUTH TWICE DAILY Patient taking differently: Take 40 mg by mouth 2 (two) times daily.  12/02/18  Yes Tiffany Kocher, PA-C  Magnesium 250 MG TABS Take 250 mg by mouth daily.    Yes [provider]  Multiple Vitamin (MULTIVITAMIN) tablet Take 1 tablet by mouth daily.   Yes [provider]  omeprazole (PRILOSEC) 20 MG  capsule TAKE ONE CAPSULE BY MOUTH TWICE DAILY BEFORE A MEAL Patient taking differently: Take 20 mg by mouth 2 (two) times daily before a meal.  10/11/18  Yes Gelene Mink, NP  propranolol (INDERAL) 20 MG tablet TAKE 1 TABLET BY MOUTH TWICE DAILY Patient taking differently: Take 20 mg by mouth 2 (two) times daily.  06/13/18  Yes Tiffany Kocher, PA-C  spironolactone (ALDACTONE) 100 MG tablet Take 1 tablet (100 mg total) by mouth 2 (two) times daily for 30 days. 10/11/18 01/21/19 Yes Gelene Mink, NP  thiamine 100  MG tablet Take 1 tablet (100 mg total) by mouth daily. 05/10/16  Yes Vassie Loll, MD  XARELTO 20 MG TABS tablet Take 20 mg by mouth 2 (two) times a day. 12/16/18  Yes [provider]    Physical Exam: BP 104/68   Pulse 77   Temp 98.7 F (37.1 C) (Oral)   Resp 19   Ht  (1.778 m)   Wt 99.8 kg   SpO2 93%   BMI 31.57 kg/m   . General: Young Caucasian male. Awake and alert and oriented x3. No acute cardiopulmonary distress.  Marland Kitchen HEENT: Normocephalic atraumatic.  Right and left ears normal in appearance.  Pupils equal, round, reactive to light. Extraocular muscles are intact. Sclerae anicteric and noninjected.  Moist mucosal membranes. No mucosal lesions.  . Neck: Neck supple without lymphadenopathy. No carotid bruits. No masses palpated.  . Cardiovascular: Regular rate with normal S1-S2 sounds. No murmurs, rubs, gallops auscultated. No JVD.  3+ pitting edema in lower extremities bilaterally . Respiratory: Diminished breath sounds in the right base.  The rales in the right midlung field.  Clear in the left lung fields.  No accessory muscle use. . Abdomen: Obese.  Soft, nontender, nondistended. Active bowel sounds. No masses.  . Skin: Several bruises, otherwise no rashes, lesions, or ulcerations.  Dry, warm to touch. 2+ dorsalis pedis and radial pulses. . Musculoskeletal: No calf or leg pain. All major joints not erythematous nontender.  No upper or lower joint deformation.  Good ROM.  No contractures  . Psychiatric: Intact judgment and insight. Pleasant and cooperative. . Neurologic: No focal neurological deficits. Strength is 5/5 and symmetric in upper and lower extremities.  Cranial nerves II through XII are grossly intact.           Labs on Admission: I have personally reviewed following labs and imaging studies  CBC: Recent Labs  Lab 01/21/19 1536  WBC 17.0*  NEUTROABS 13.6*  HGB 11.8*  HCT 33.2*  MCV 96.0  PLT 171   Basic Metabolic Panel: Recent Labs  Lab  01/21/19 1536  NA 121*  K 3.4*  CL 88*  CO2 22  GLUCOSE 117*  BUN 7  CREATININE 0.60*  CALCIUM 7.9*   GFR: Estimated Creatinine Clearance: 149.7 mL/min (A) (by C-G formula based on SCr of 0.6 mg/dL (L)). Liver Function Tests: Recent Labs  Lab 01/21/19 1536  AST 127*  ALT 41  ALKPHOS 170*  BILITOT 2.9*  PROT 8.5*  ALBUMIN 2.2*   Recent Labs  Lab 01/21/19 1648  LIPASE 37   No results for input(s): AMMONIA in the last 168 hours. Coagulation Profile: Recent Labs  Lab 01/21/19 1648  INR 6.1*   Cardiac Enzymes: No results for input(s): CKTOTAL, CKMB, CKMBINDEX, TROPONINI in the last 168 hours. BNP (last 3 results) No results for input(s): PROBNP in the last 8760 hours. HbA1C: No results for input(s): HGBA1C in the last 72  hours. CBG: No results for input(s): GLUCAP in the last 168 hours. Lipid Profile: No results for input(s): CHOL, HDL, LDLCALC, TRIG, CHOLHDL, LDLDIRECT in the last 72 hours. Thyroid Function Tests: No results for input(s): TSH, T4TOTAL, FREET4, T3FREE, THYROIDAB in the last 72 hours. Anemia Panel: No results for input(s): VITAMINB12, FOLATE, FERRITIN, TIBC, IRON, RETICCTPCT in the last 72 hours. Urine analysis:    Component Value Date/Time   COLORURINE AMBER (A) 01/21/2019 1613   APPEARANCEUR CLEAR 01/21/2019 1613   LABSPEC 1.012 01/21/2019 1613   PHURINE 5.0 01/21/2019 1613   GLUCOSEU NEGATIVE 01/21/2019 1613   HGBUR LARGE (A) 01/21/2019 1613   BILIRUBINUR NEGATIVE 01/21/2019 1613   KETONESUR NEGATIVE 01/21/2019 1613   PROTEINUR NEGATIVE 01/21/2019 1613   NITRITE NEGATIVE 01/21/2019 1613   LEUKOCYTESUR TRACE (A) 01/21/2019 1613   Sepsis Labs: @LABRCNTIP (procalcitonin:4,lacticidven:4) ) Recent Results (from the past 240 hour(s))  SARS Coronavirus 2 [facility] order, Performed in [facility] hospital lab)     Status: None   Collection Time: 01/21/19  3:58 PM  Result Value Ref Range Status   SARS Coronavirus 2 NEGATIVE NEGATIVE Final     Comment: (NOTE) If result is NEGATIVE SARS-CoV-2 target nucleic acids are NOT DETECTED. The SARS-CoV-2 RNA is generally detectable in upper and lower  respiratory specimens during the acute phase of infection. The lowest  concentration of SARS-CoV-2 viral copies this assay can detect is 250  copies / mL. A negative result does not preclude SARS-CoV-2 infection  and should not be used as the sole basis for treatment or other  patient management decisions.  A negative result may occur with  improper specimen collection / handling, submission of specimen other  than nasopharyngeal swab, presence of viral mutation(s) within the  areas targeted by this assay, and inadequate number of viral copies  (<250 copies / mL). A negative result must be combined with clinical  observations, patient history, and epidemiological information. If result is POSITIVE SARS-CoV-2 target nucleic acids are DETECTED. The SARS-CoV-2 RNA is generally detectable in upper and lower  respiratory specimens dur ing the acute phase of infection.  Positive  results are indicative of active infection with SARS-CoV-2.  Clinical  correlation with patient history and other diagnostic information is  necessary to determine patient infection status.  Positive results do  not rule out bacterial infection or co-infection with other viruses. If result is PRESUMPTIVE POSTIVE SARS-CoV-2 nucleic acids MAY BE PRESENT.   A presumptive positive result was obtained on the submitted specimen  and confirmed on repeat testing.  While 2019 novel coronavirus  (SARS-CoV-2) nucleic acids may be present in the submitted sample  additional confirmatory testing may be necessary for epidemiological  and / or clinical management purposes  to differentiate between  SARS-CoV-2 and other Sarbecovirus currently known to infect humans.  If clinically indicated additional testing with an alternate test  methodology [phone]) is advised. The SARS-CoV-2  RNA is generally  detectable in upper and lower respiratory sp ecimens during the acute  phase of infection. The expected result is Negative. Fact Sheet for Patients:  BoilerBrush.com.cy Fact Sheet for Healthcare Providers: https://pope.com/ This test is not yet approved or cleared by the [place] FDA and has been authorized for detection and/or diagnosis of SARS-CoV-2 by FDA under an Emergency Use Authorization (EUA).  This EUA will remain in effect (meaning this test can be used) for the duration of the COVID-19 declaration under Section 564(b)(1) of the Act, 21 U.S.C. section 360bbb-3(b)(1), unless the authorization  is terminated or revoked sooner. Performed at [facility], [address] [place], [place] [address]   Culture, blood (Routine X 2) w Reflex to ID Panel     Status: None (Preliminary result)   Collection Time: 01/21/19  4:53 PM  Result Value Ref Range Status   Specimen Description   Final    LEFT ANTECUBITAL BOTTLES DRAWN AEROBIC AND ANAEROBIC   Special Requests   Final    Blood Culture adequate volume Performed at [facility], [address] [place], [place] [address]    Culture PENDING  Incomplete   Report Status PENDING  Incomplete  Culture, blood (Routine X 2) w Reflex to ID Panel     Status: None (Preliminary result)   Collection Time: 01/21/19  4:54 PM  Result Value Ref Range Status   Specimen Description BLOOD LEFT ARM BOTTLES DRAWN AEROBIC ONLY  Final   Special Requests   Final    Blood Culture adequate volume Performed at [facility], [address] [place], [place] [address]    Culture PENDING  Incomplete   Report Status PENDING  Incomplete     Radiological Exams on Admission: Ct Chest Wo Contrast  Result Date: 01/21/2019 CLINICAL DATA:  Right lower lobe opacity and complex pleural fluid seen on prior abdominal CT. EXAM: CT CHEST WITHOUT CONTRAST TECHNIQUE: Multidetector CT imaging of the chest  was performed following the standard protocol without IV contrast. COMPARISON:  CT abdomen 01/21/2019 FINDINGS: Cardiovascular: Heart is normal size. Aorta is normal caliber. Coronary artery calcification in the left anterior descending coronary artery. Mediastinum/Nodes: No mediastinal, hilar, or axillary adenopathy. Lungs/Pleura: Dense consolidation noted in the right lower lobe compatible with pneumonia. Loculated pleural fluid noted along the right heart border and superior laterally in the right hemithorax. There is a rounded fluid collection posteriorly in the right lower hemithorax measuring 7.2 cm concerning for empyema or pulmonary abscess. On coronal imaging, this extends over a craniocaudal length of 10 cm. Dependent atelectasis in the left lower lobe. Upper Abdomen: Changes of cirrhosis with ascites. Musculoskeletal: No acute bony abnormality. Chest wall soft tissues are unremarkable. IMPRESSION: Dense consolidation in the right lower lobe compatible with pneumonia. Loculated right pleural fluid. Rounded fluid collection posteriorly in the right lower hemithorax which could be loculated fluid and reflect empyema or pulmonary abscess. Coronary artery disease. Cirrhosis, ascites. Electronically Signed   By: Charlett Nose M.D.   On: 01/21/2019 20:28   Ct Abdomen Pelvis W Contrast  Result Date: 01/21/2019 CLINICAL DATA:  Coughing since January. History of thrombocytopenia, splenomegaly and cirrhosis. Recently diagnosed with pneumonia. EXAM: CT ABDOMEN AND PELVIS WITH CONTRAST TECHNIQUE: Multidetector CT imaging of the abdomen and pelvis was performed using the standard protocol following bolus administration of intravenous contrast. CONTRAST:  OMNIPAQUE IOHEXOL 300 MG/ML  SOLN COMPARISON:  Chest radiographs today. Abdominopelvic CT 05/08/2016. Right upper quadrant ultrasound 06/30/2018. FINDINGS: Lower chest: The lung bases are incompletely visualized. There is consolidation of the visualized right  lower lobe with a large loculated right pleural effusion. This has a component posteriorly with thick peripheral enhancement, measuring up to 7.9 x 5.2 cm on image 9/2. Based on the provided history, this is suspicious for empyema and possible pleural-based abscess. There is minimal atelectasis at the left lung base and no significant left pleural effusion. The azygos and hemi azygous veins are mildly dilated. Hepatobiliary: There are progressive changes of cirrhosis throughout the liver with diffuse contour irregularity and parenchymal heterogeneity. No dominant mass or focally abnormal  enhancement identified. The portal vein is patent. No evidence of gallstones, gallbladder wall thickening or biliary dilatation. Pancreas: Unremarkable. No pancreatic ductal dilatation or surrounding inflammatory changes. Spleen: Measures approximately 14.7 x 7.5 x 12.3 cm (volume = 710 cm^3) consistent with mild splenomegaly. No focal abnormality. Adrenals/Urinary Tract: Both adrenal glands appear normal. The kidneys, ureters and bladder appear normal. Stomach/Bowel: No evidence of bowel wall thickening, distention or surrounding inflammatory change. There is a large amount of stool within the rectum. There is a probable loop of small bowel extending into an umbilical hernia. No evidence of bowel obstruction or incarceration. Vascular/Lymphatic: There are no enlarged abdominal or pelvic lymph nodes. Minimal aortic atherosclerosis. No acute vascular findings are identified. 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 The portal, superior mesenteric and splenic veins appear patent. Reproductive: The prostate gland and seminal vesicles appear normal. Other: Interval development of a moderate amount of ascites. Fluid extends into an umbilical hernia which measures up to 8.6 cm transverse on image 73/2. As above, there is air within this hernia as well which appears to be due to a small knuckle of small  bowel. Musculoskeletal: No acute osseous findings. There are old rib fractures on the left. There are changes of bilateral femoral head avascular necrosis with early subchondral collapse on the right. There is edema throughout the subcutaneous fat. In the upper right buttocks, there is a globular area of increased signal which could be related to prior trauma/injection or reflect fat necrosis. IMPRESSION: 1. Right lower lobe pulmonary consolidation with complex fluid in the right pleural space, worrisome for empyema and pleural-based abscess. Full chest CT may be helpful for further evaluation. 2. Significantly progressive changes of diffuse hepatic cirrhosis compared with previous CT from 2017. Mild changes of portal hypertension with splenomegaly and venous collaterals. The main portal vein appears patent. 3. Progressive moderate volume ascites. Ascites and a small bowel loop extend into an umbilical hernia. No evidence of bowel obstruction or incarceration. 4. Chronic bilateral femoral head avascular necrosis. Electronically Signed   By: Carey BullocksWilliam  Veazey M.D.   On: 01/21/2019 19:37   Dg Chest Portable 1 View  Result Date: 01/21/2019 CLINICAL DATA:  38 year old with current history of hepatic cirrhosis and, by the patient's report, recent diagnosis of pneumonia at an outside facility. EXAM: PORTABLE CHEST 1 VIEW COMPARISON:  02/21/2018 and earlier, including CTA chest 05/13/2015. FINDINGS: Cardiac silhouette moderately to markedly enlarged, unchanged since 2019 but increased in size since 2016. Pulmonary venous hypertension with perhaps minimal interstitial pulmonary edema. Large RIGHT pleural effusion and associated consolidation in the RIGHT LOWER LOBE. Lungs otherwise clear. No visible LEFT pleural effusion. IMPRESSION: 1. Large RIGHT pleural effusion and associated passive atelectasis and/or pneumonia involving the RIGHT LOWER LOBE. 2. Stable moderate to marked cardiomegaly. Pulmonary venous hypertension  with perhaps minimal interstitial pulmonary edema indicating CHF and/or fluid overload. Electronically Signed   By: Hulan Saashomas  Lawrence M.D.   On: 01/21/2019 16:25    EKG: Independently reviewed.  Sinus rhythm with borderline interventricular conduction delay.  No ST changes.  Assessment/Plan: Principal Problem:   Acute respiratory failure with hypoxia (HCC) Active Problems:   Alcoholic cirrhosis of liver with ascites (HCC)   Esophageal reflux   Morbid obesity due to excess calories (HCC)   Hypokalemia   Ascites   RLL pneumonia (HCC)   Empyema of right pleural space (HCC)   Hyponatremia   Supratherapeutic INR    This patient was discussed with the ED physician, including pertinent  vitals, physical exam findings, labs, and imaging.  We also discussed care given by the ED provider.  1. Acute respiratory failure with hypoxia a. Admit to inpatient.  My professional opinion, I believe the patient will need at least 2 hospitalization in order for correction of his laboratory values, clearing the patient's empyema, and treatment of his pneumonia. b. Continue oxygen support to maintain oxygen saturations above 90%. c. COVID-19 ruled out 2. Lactic acidosis a. We will give small IV fluid bolus and recheck lactic acid level. b. Will need to be cautious given the patient's hyponatremia, which may be due to SIADH given the patient's lung infection. 3. Right lower lobe pneumonia Antibiotics: Vancomycin, cefepime Will screen for MRSA Robitussin Blood cultures drawn in the emergency department Sputum cultures CBC tomorrow Strep antigen by urine 4. Empyema of right lungs base a. Patient discussed with Dr. Robby Sermon of CT surgery.  They will consult in the morning.  No VATS procedure planned for tomorrow given patient supratherapeutic INR 5. Hyponatremia a. Appears hypervolemic b. Question SIADH c. We will check urine osmolality and urine sodium d. Fluid restrict for now e. Hold diuretics,  especially given soft blood pressures 6. Hypokalemia a. We will give K-Dur 7. Supratherapeutic INR a. Hold Xarelto b. Check INR daily 8. Ascites a. Stable 9. Alcoholic cirrhosis of liver a. Stable 10. GERD a. Continue PPI 11. Obesity  DVT prophylaxis: Supratherapeutic INR.  We will hold Xarelto.  Contain SCDs Consultants: CT surgery Code Status: Full code Family Communication: None Disposition Plan: Patient will likely be able to return home after hospitalization   Levie Heritage, DO

## 2019-01-21 NOTE — ED Notes (Signed)
Date and time results received: 01/21/19 5:31 PM  (use smartphrase ".now" to insert current time)  Test: INR Critical Value: 6.1  Name of Provider Notified: Zackowski  Orders Received? Or Actions Taken?: Orders Received - See Orders for details

## 2019-01-21 NOTE — ED Notes (Signed)
Pt able to ambulate to restroom with minimal assistance

## 2019-01-21 NOTE — ED Provider Notes (Signed)
[facility] EMERGENCY DEPARTMENT Provider Note   CSN: [id number] Arrival date & time: 01/21/19  1506    History   Chief Complaint Chief Complaint  Patient presents with   Shortness of Breath    HPI Grant [name] is a 38 y.o. male.     Patient with a known history of alcoholic cirrhosis.  Known to have ascites.  Patient stated that he has felt as if he has had pneumonia or respiratory infection since the end of January.  Last 3 weeks things have been worse.  He is on some kind of antibiotic for pneumonia currently but not sure which one that is.  Primary care doctor is up in the [place] area between [place] and [place].  Patient is also been seen by Dr. Andrey Campanile feels in the past he got contacted her today and she appropriately referred him in for evaluation.  Patient does not use oxygen at home but was hypoxic on room air eventually was put on 3 L.  And able to taper that down to 2 L and at rest he was satting in the mid 90% range.  Abdomen nontender plus no complaint of abdominal pain.  Just shortness of breath is his complaint.  Patient was tested for coronavirus 3 weeks ago and it was negative outside of our system.  Patient is on a blood thinner.  Which is not on his list.  Patient is on Aldactone he is on Lasix folic acid multivitamins Inderal thiamine Prilosec.  Patient states that he has had fevers.  But not recently.  Patient states that his belly swelling is baseline he had fluid drained from the abdomen about a year ago none since.  He has had some increased leg swelling.     Past Medical History:  Diagnosis Date   Ascites 2013   Avascular necrosis (HCC)    Campylobacter diarrhea 04/2016   bloody diarrhea.    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 Hepatitis B and C serologies negative 06/2016.      Hypomagnesemia 01/31/2018   Splenomegaly 2013   Thrombocytopenia (HCC) 2013    Patient Active Problem List   Diagnosis Date  Noted   Portal vein thrombosis 08/02/2018   Abdominal distension    Community acquired pneumonia    LLL pneumonia (HCC) 01/31/2018   Symptomatic anemia 01/31/2018   Hypomagnesemia 01/31/2018   Hypokalemia 01/31/2018   Esophageal reflux    Morbid obesity due to excess calories (HCC)    Dehydration 05/09/2016   Alcoholic cirrhosis of liver with ascites (HCC) 05/09/2016   Diarrhea 05/09/2016   Campylobacter diarrhea 05/09/2016   Alcohol abuse 05/09/2016   Hematochezia    Thrombocytopenia (HCC) 09/29/2011   Splenomegaly 09/29/2011   Cirrhosis (HCC) 09/29/2011   Ascites 09/29/2011    Past Surgical History:  Procedure Laterality Date   ESOPHAGOGASTRODUODENOSCOPY  08/2015   [place], Dr. Teena Dunk medium sized varices in distal esophagus, evidence of red wale sign   PARACENTESIS  2013   In [place] at [facility] associated hospital.         Home Medications    Prior to Admission medications   Medication Sig Start Date End Date Taking? Authorizing Provider  folic acid (FOLVITE) 1 MG tablet Take 1 tablet (1 mg total) by mouth daily. 05/10/16  Yes Vassie Loll, MD  furosemide (LASIX) 40 MG tablet TAKE 1 TABLET BY MOUTH TWICE DAILY Patient taking differently: Take 40 mg by mouth 2 (two) times daily.  12/02/18  Yes Tiffany Kocher, PA-C  Magnesium 250 MG TABS Take 250 mg by mouth daily.    Yes [provider]  Multiple Vitamin (MULTIVITAMIN) tablet Take 1 tablet by mouth daily.   Yes [provider]  omeprazole (PRILOSEC) 20 MG capsule TAKE ONE CAPSULE BY MOUTH TWICE DAILY BEFORE A MEAL Patient taking differently: Take 20 mg by mouth 2 (two) times daily before a meal.  10/11/18  Yes Gelene Mink, NP  propranolol (INDERAL) 20 MG tablet TAKE 1 TABLET BY MOUTH TWICE DAILY Patient taking differently: Take 20 mg by mouth 2 (two) times daily.  06/13/18  Yes Tiffany Kocher, PA-C  spironolactone (ALDACTONE) 100 MG tablet Take 1 tablet (100 mg total) by mouth 2  (two) times daily for 30 days. 10/11/18 01/21/19 Yes Gelene Mink, NP  thiamine 100 MG tablet Take 1 tablet (100 mg total) by mouth daily. 05/10/16  Yes Vassie Loll, MD  XARELTO 20 MG TABS tablet Take 20 mg by mouth 2 (two) times a day. 12/16/18  Yes [provider]    Family History Family History  Problem Relation Age of Onset   Diabetes Father    Hypertension Father    Colon polyps Father 67   Diabetes Other    Cancer Neg Hx    Colon cancer Neg Hx    Liver disease Neg Hx     Social History Social History   Tobacco Use   Smoking status: Never Smoker   Smokeless tobacco: Never Used  Substance Use Topics   Alcohol use: Not Currently    Alcohol/week: 20.0 standard drinks    Types: 20 Cans of beer per week    Comment: history of ETOH abuse, Last drank in April 2019 as of 06/29/18.   Drug use: No     Allergies   Patient has no known allergies.   Review of Systems Review of Systems  Constitutional: Positive for fever. Negative for chills.  HENT: Negative for rhinorrhea and sore throat.   Eyes: Negative for visual disturbance.  Respiratory: Positive for shortness of breath. Negative for cough.   Cardiovascular: Positive for leg swelling. Negative for chest pain.  Gastrointestinal: Negative for abdominal pain, diarrhea, nausea and vomiting.  Genitourinary: Negative for dysuria and hematuria.  Musculoskeletal: Negative for back pain and neck pain.  Skin: Negative for rash.  Allergic/Immunologic: Positive for immunocompromised state.  Neurological: Negative for dizziness, light-headedness and headaches.  Hematological: Does not bruise/bleed easily.  Psychiatric/Behavioral: Negative for confusion.     Physical Exam Updated Vital Signs BP 114/83    Pulse 79    Temp 98.7 F (37.1 C) (Oral)    Resp 18    Ht 1.778 m ( )    Wt 99.8 kg    SpO2 97%    BMI 31.57 kg/m   Physical Exam Vitals signs and nursing note reviewed.  Constitutional:       General: He is not in acute distress.    Appearance: Normal appearance. He is well-developed.  HENT:     Head: Normocephalic and atraumatic.  Eyes:     General: Scleral icterus present.     Extraocular Movements: Extraocular movements intact.     Conjunctiva/sclera: Conjunctivae normal.     Pupils: Pupils are equal, round, and reactive to light.  Neck:     Musculoskeletal: Normal range of motion and neck supple.  Cardiovascular:     Rate and Rhythm: Normal rate and regular rhythm.  Heart sounds: No murmur.  Pulmonary:     Effort: Pulmonary effort is normal. No respiratory distress.     Breath sounds: Normal breath sounds. No wheezing.     Comments: Decreased breath sounds on the right Abdominal:     General: There is distension.     Palpations: Abdomen is soft.     Tenderness: There is no abdominal tenderness.  Musculoskeletal:     Right lower leg: Edema present.     Left lower leg: Edema present.  Skin:    General: Skin is warm and dry.     Capillary Refill: Capillary refill takes less than 2 seconds.     Coloration: Skin is jaundiced.  Neurological:     General: No focal deficit present.     Mental Status: He is alert and oriented to person, place, and time.      ED Treatments / Results  Labs (all labs ordered are listed, but only abnormal results are displayed) Labs Reviewed  CBC WITH DIFFERENTIAL/PLATELET - Abnormal; Notable for the following components:      Result Value   WBC 17.0 (*)    RBC 3.46 (*)    Hemoglobin 11.8 (*)    HCT 33.2 (*)    MCH 34.1 (*)    Neutro Abs 13.6 (*)    Monocytes Absolute 1.3 (*)    Abs Immature Granulocytes 0.42 (*)    All other components within normal limits  COMPREHENSIVE METABOLIC PANEL - Abnormal; Notable for the following components:   Sodium 121 (*)    Potassium 3.4 (*)    Chloride 88 (*)    Glucose, Bld 117 (*)    Creatinine, Ser 0.60 (*)    Calcium 7.9 (*)    Total Protein 8.5 (*)    Albumin 2.2 (*)    AST 127  (*)    Alkaline Phosphatase 170 (*)    Total Bilirubin 2.9 (*)    All other components within normal limits  LACTIC ACID, PLASMA - Abnormal; Notable for the following components:   Lactic Acid, Venous 2.6 (*)    All other components within normal limits  PROTIME-INR - Abnormal; Notable for the following components:   Prothrombin Time 53.4 (*)    INR 6.1 (*)    All other components within normal limits  BRAIN NATRIURETIC PEPTIDE - Abnormal; Notable for the following components:   B Natriuretic Peptide 318.0 (*)    All other components within normal limits  URINALYSIS, ROUTINE W REFLEX MICROSCOPIC - Abnormal; Notable for the following components:   Color, Urine AMBER (*)    Hgb urine dipstick LARGE (*)    Leukocytes,Ua TRACE (*)    RBC / HPF >50 (*)    Bacteria, UA RARE (*)    All other components within normal limits  SARS CORONAVIRUS 2 (HOSPITAL ORDER, PERFORMED IN [facility])  CULTURE, BLOOD (ROUTINE X 2)  CULTURE, BLOOD (ROUTINE X 2)  URINE CULTURE  MRSA PCR SCREENING  LIPASE, BLOOD    EKG EKG Interpretation  Date/Time:  Saturday January 21 2019 15:29:59 EDT Ventricular Rate:  76 PR Interval:    QRS Duration: 112 QT Interval:  422 QTC Calculation: 475 R Axis:   19 Text Interpretation:  Sinus rhythm Borderline intraventricular conduction delay Baseline wander in lead(s) V3 No significant change since last tracing Reconfirmed by Vanetta Mulders [id number]) on 01/21/2019 4:07:54 PM   Radiology Ct Chest Wo Contrast  Result Date: 01/21/2019 CLINICAL DATA:  Right lower lobe opacity  and complex pleural fluid seen on prior abdominal CT. EXAM: CT CHEST WITHOUT CONTRAST TECHNIQUE: Multidetector CT imaging of the chest was performed following the standard protocol without IV contrast. COMPARISON:  CT abdomen 01/21/2019 FINDINGS: Cardiovascular: Heart is normal size. Aorta is normal caliber. Coronary artery calcification in the left anterior descending coronary artery.  Mediastinum/Nodes: No mediastinal, hilar, or axillary adenopathy. Lungs/Pleura: Dense consolidation noted in the right lower lobe compatible with pneumonia. Loculated pleural fluid noted along the right heart border and superior laterally in the right hemithorax. There is a rounded fluid collection posteriorly in the right lower hemithorax measuring 7.2 cm concerning for empyema or pulmonary abscess. On coronal imaging, this extends over a craniocaudal length of 10 cm. Dependent atelectasis in the left lower lobe. Upper Abdomen: Changes of cirrhosis with ascites. Musculoskeletal: No acute bony abnormality. Chest wall soft tissues are unremarkable. IMPRESSION: Dense consolidation in the right lower lobe compatible with pneumonia. Loculated right pleural fluid. Rounded fluid collection posteriorly in the right lower hemithorax which could be loculated fluid and reflect empyema or pulmonary abscess. Coronary artery disease. Cirrhosis, ascites. Electronically Signed   By: Charlett Nose M.D.   On: 01/21/2019 20:28   Ct Abdomen Pelvis W Contrast  Result Date: 01/21/2019 CLINICAL DATA:  Coughing since January. History of thrombocytopenia, splenomegaly and cirrhosis. Recently diagnosed with pneumonia. EXAM: CT ABDOMEN AND PELVIS WITH CONTRAST TECHNIQUE: Multidetector CT imaging of the abdomen and pelvis was performed using the standard protocol following bolus administration of intravenous contrast. CONTRAST:  OMNIPAQUE IOHEXOL 300 MG/ML  SOLN COMPARISON:  Chest radiographs today. Abdominopelvic CT 05/08/2016. Right upper quadrant ultrasound 06/30/2018. FINDINGS: Lower chest: The lung bases are incompletely visualized. There is consolidation of the visualized right lower lobe with a large loculated right pleural effusion. This has a component posteriorly with thick peripheral enhancement, measuring up to 7.9 x 5.2 cm on image 9/2. Based on the provided history, this is suspicious for empyema and possible  pleural-based abscess. There is minimal atelectasis at the left lung base and no significant left pleural effusion. The azygos and hemi azygous veins are mildly dilated. Hepatobiliary: There are progressive changes of cirrhosis throughout the liver with diffuse contour irregularity and parenchymal heterogeneity. No dominant mass or focally abnormal enhancement identified. The portal vein is patent. No evidence of gallstones, gallbladder wall thickening or biliary dilatation. Pancreas: Unremarkable. No pancreatic ductal dilatation or surrounding inflammatory changes. Spleen: Measures approximately 14.7 x 7.5 x 12.3 cm (volume = 710 cm^3) consistent with mild splenomegaly. No focal abnormality. Adrenals/Urinary Tract: Both adrenal glands appear normal. The kidneys, ureters and bladder appear normal. Stomach/Bowel: No evidence of bowel wall thickening, distention or surrounding inflammatory change. There is a large amount of stool within the rectum. There is a probable loop of small bowel extending into an umbilical hernia. No evidence of bowel obstruction or incarceration. Vascular/Lymphatic: There are no enlarged abdominal or pelvic lymph nodes. Minimal aortic atherosclerosis. No acute vascular findings are identified. 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 The portal, superior mesenteric and splenic veins appear patent. Reproductive: The prostate gland and seminal vesicles appear normal. Other: Interval development of a moderate amount of ascites. Fluid extends into an umbilical hernia which measures up to 8.6 cm transverse on image 73/2. As above, there is air within this hernia as well which appears to be due to a small knuckle of small bowel. Musculoskeletal: No acute osseous findings. There are old rib fractures on the left. There are changes of  bilateral femoral head avascular necrosis with early subchondral collapse on the right. There is edema throughout the subcutaneous fat.  In the upper right buttocks, there is a globular area of increased signal which could be related to prior trauma/injection or reflect fat necrosis. IMPRESSION: 1. Right lower lobe pulmonary consolidation with complex fluid in the right pleural space, worrisome for empyema and pleural-based abscess. Full chest CT may be helpful for further evaluation. 2. Significantly progressive changes of diffuse hepatic cirrhosis compared with previous CT from 2017. Mild changes of portal hypertension with splenomegaly and venous collaterals. The main portal vein appears patent. 3. Progressive moderate volume ascites. Ascites and a small bowel loop extend into an umbilical hernia. No evidence of bowel obstruction or incarceration. 4. Chronic bilateral femoral head avascular necrosis. Electronically Signed   By: Carey BullocksWilliam  Veazey M.D.   On: 01/21/2019 19:37   Dg Chest Portable 1 View  Result Date: 01/21/2019 CLINICAL DATA:  38 year old with current history of hepatic cirrhosis and, by the patient's report, recent diagnosis of pneumonia at an outside facility. EXAM: PORTABLE CHEST 1 VIEW COMPARISON:  02/21/2018 and earlier, including CTA chest 05/13/2015. FINDINGS: Cardiac silhouette moderately to markedly enlarged, unchanged since 2019 but increased in size since 2016. Pulmonary venous hypertension with perhaps minimal interstitial pulmonary edema. Large RIGHT pleural effusion and associated consolidation in the RIGHT LOWER LOBE. Lungs otherwise clear. No visible LEFT pleural effusion. IMPRESSION: 1. Large RIGHT pleural effusion and associated passive atelectasis and/or pneumonia involving the RIGHT LOWER LOBE. 2. Stable moderate to marked cardiomegaly. Pulmonary venous hypertension with perhaps minimal interstitial pulmonary edema indicating CHF and/or fluid overload. Electronically Signed   By: Hulan Saashomas  Lawrence M.D.   On: 01/21/2019 16:25    Procedures Procedures (including critical care time)  CRITICAL CARE Performed  by: Vanetta MuldersScott Chandon Lazcano Total critical care time: 30 minutes Critical care time was exclusive of separately billable procedures and treating other patients. Critical care was necessary to treat or prevent imminent or life-threatening deterioration. Critical care was time spent personally by me on the following activities: development of treatment plan with patient and/or surrogate as well as nursing, discussions with consultants, evaluation of patient's response to treatment, examination of patient, obtaining history from patient or surrogate, ordering and performing treatments and interventions, ordering and review of laboratory studies, ordering and review of radiographic studies, pulse oximetry and re-evaluation of patient's condition.   Medications Ordered in ED Medications  vancomycin (VANCOCIN) IVPB 1000 mg/200 mL premix (has no administration in time range)  ceFEPIme (MAXIPIME) 1 g in sodium chloride 0.9 % 100 mL IVPB (0 g Intravenous Stopped 01/21/19 1802)  vancomycin (VANCOCIN) IVPB 1000 mg/200 mL premix (0 mg Intravenous Stopped 01/21/19 2042)  iohexol (OMNIPAQUE) 300 MG/ML solution 100 mL (100 mLs Intravenous Contrast Given 01/21/19 1859)     Initial Impression / Assessment and Plan / ED Course  I have reviewed the triage vital signs and the nursing notes.  Pertinent labs & imaging results that were available during my care of the patient were reviewed by me and considered in my medical decision making (see chart for details).       Patient presented hypoxic.  Chest x-ray showed evidence of a right-sided pleural effusion perhaps with underlying pneumonia.  Patient had blood cultures done lactic acid done started on broad-spectrum antibiotics for H CAP pneumonia.  Urinalysis had hematuria urine sent for culture.  Patient's INR was markedly elevated at 6.1.  Renal function was surprisingly normal.  Liver function test abnormal but baseline  for him.  CT of abdomen was done there was no  abdominal tenderness.  Mostly done due to the hematuria.  Although felt hematuria could be secondary to the elevated INR.  Patient not on Coumadin but is on another blood thinner.  CT scan of the abdomen showed the cirrhosis to be worse than previous study.  Did have some ascites but no other significant changes.  Did raise concern for empyema on the right lung.  Did show the pleural effusion.  Based on this CT scan of the chest was done it was done without contrast and she does had contrast for the abdomen.  That does show concern the pleural effusion raises concerns for loculated pleural effusion may be empyema may be abscess.  All this discussed with the hospitalist for admission.  Patient will probably need to be transferred to [facility] for admission and will need cardiothoracic consultation.  Patient's vital signs here are on the 2 L oxygen levels are good patient is comfortable able to rest.  Not hypotensive not tachycardic not tachypneic no fever.  Patient's vital signs did not meet criteria for sepsis.  Lactic acid was less than 4 but elevated at 2.6.  But patient did have blood cultures and was started on broad-spectrum antibiotics.   In addition patient had COVID testing and that was negative.   Final Clinical Impressions(s) / ED Diagnoses   Final diagnoses:  Hypoxia  HCAP (healthcare-associated pneumonia)  Pleural effusion on right  Alcoholic cirrhosis of liver with ascites [facility])    ED Discharge Orders    None       Vanetta Mulders, MD 01/21/19 2129

## 2019-01-21 NOTE — ED Triage Notes (Signed)
Pt with 1 prev admission here  Has hx of paracentesis   abd swollen and distended  Clear voiced   Pulse ox 88-89 per cent on room air   O2 [place]/1L with return of sat of 94 per cent

## 2019-01-21 NOTE — Telephone Encounter (Signed)
Pt called. Having trouble with fatigue, weakness. TREATED FOR PNA FOR 10 DAYS AND THEN 2ND ROUND. NOW ON ABX FOR 4 DAYS. Thinks the Abx are not helping. PMHx: ETOH CIRRHOSIS. DISCUSSED WITH DR. MILLER. WILL AWAIT ARRIVAL IN ED.

## 2019-01-21 NOTE — Progress Notes (Signed)
Pharmacy Antibiotic Note  Grant [name] is a 38 y.o. male admitted on 01/21/2019 with pneumonia.  Pharmacy has been consulted for  vancomycin dosing.  Plan: Loading dose:  vancomycin 1g IV q1h x2 doses for a total dose of 2g Maintenance dose:  vancomycin 1g IV q12h Goal vancomycin trough range:   15-20 mcg/mL Pharmacy will continue to monitor renal function, vancomycin troughs as clinically appropriate, cultures and patient progress.   Height: 5\' 10"  (177.8 cm) Weight: 220 lb (99.8 kg) IBW/kg (Calculated) : 73  Temp (24hrs), Avg:98.7 F (37.1 C), Min:98.7 F (37.1 C), Max:98.7 F (37.1 C)  Recent Labs  Lab 01/21/19 1536 01/21/19 1648  WBC 17.0*  --   CREATININE 0.60*  --   LATICACIDVEN  --  2.6*    Estimated Creatinine Clearance: 149.7 mL/min (A) (by C-G formula based on SCr of 0.6 mg/dL (L)).    No Known Allergies  Antimicrobials this admission: cefepime 4/24 >> 4/24 vancomycin 4/24>>       Microbiology results: 4/25 [facility] x2:  pending 4/25 SARS CoV2: negative 4/25  MRSA PCR:    Thank you for allowing pharmacy to be a part of this patient's care.  Tama High 01/21/2019 6:26 PM

## 2019-01-21 NOTE — ED Notes (Signed)
Pt tells me he was just prescribed xarelto about a week ago and has been taking it twice daily.  When this verified, pt states he takes all of his medications twice daily so he is sure this is how he is taking it.

## 2019-01-21 NOTE — ED Triage Notes (Signed)
Pt reports dx with "pretty severe pneumonia"  Seen by "sandy [facility]" Not satisfied with care there  Here because he believes he is worse

## 2019-01-22 ENCOUNTER — Encounter (HOSPITAL_COMMUNITY): Payer: Self-pay

## 2019-01-22 ENCOUNTER — Other Ambulatory Visit: Payer: Self-pay

## 2019-01-22 DIAGNOSIS — J869 Pyothorax without fistula: Secondary | ICD-10-CM

## 2019-01-22 LAB — CBC
HCT: 30.3 % — ABNORMAL LOW (ref 39.0–52.0)
Hemoglobin: 10.8 g/dL — ABNORMAL LOW (ref 13.0–17.0)
MCH: 33.8 pg (ref 26.0–34.0)
MCHC: 35.6 g/dL (ref 30.0–36.0)
MCV: 94.7 fL (ref 80.0–100.0)
Platelets: 145 10*3/uL — ABNORMAL LOW (ref 150–400)
RBC: 3.2 MIL/uL — ABNORMAL LOW (ref 4.22–5.81)
RDW: 13.8 % (ref 11.5–15.5)
WBC: 15.6 10*3/uL — ABNORMAL HIGH (ref 4.0–10.5)
nRBC: 0 % (ref 0.0–0.2)

## 2019-01-22 LAB — COMPREHENSIVE METABOLIC PANEL
ALT: 40 U/L (ref 0–44)
AST: 129 U/L — ABNORMAL HIGH (ref 15–41)
Albumin: 1.7 g/dL — ABNORMAL LOW (ref 3.5–5.0)
Alkaline Phosphatase: 154 U/L — ABNORMAL HIGH (ref 38–126)
Anion gap: 13 (ref 5–15)
BUN: 5 mg/dL — ABNORMAL LOW (ref 6–20)
CO2: 20 mmol/L — ABNORMAL LOW (ref 22–32)
Calcium: 8 mg/dL — ABNORMAL LOW (ref 8.9–10.3)
Chloride: 89 mmol/L — ABNORMAL LOW (ref 98–111)
Creatinine, Ser: 0.64 mg/dL (ref 0.61–1.24)
GFR calc Af Amer: >60 mL/min (ref >60)
GFR calc non Af Amer: >60 mL/min (ref >60)
Glucose, Bld: 104 mg/dL — ABNORMAL HIGH (ref 70–99)
Potassium: 3.4 mmol/L — ABNORMAL LOW (ref 3.5–5.1)
Sodium: 122 mmol/L — ABNORMAL LOW (ref 135–145)
Total Bilirubin: 3.1 mg/dL — ABNORMAL HIGH (ref 0.3–1.2)
Total Protein: 7.9 g/dL (ref 6.5–8.1)

## 2019-01-22 LAB — EXPECTORATED SPUTUM ASSESSMENT W GRAM STAIN, RFLX TO RESP C: Special Requests: NORMAL

## 2019-01-22 LAB — STREP PNEUMONIAE URINARY ANTIGEN: Strep Pneumo Urinary Antigen: NEGATIVE

## 2019-01-22 LAB — EXPECTORATED SPUTUM ASSESSMENT W REFEX TO RESP CULTURE

## 2019-01-22 LAB — LACTIC ACID, PLASMA: Lactic Acid, Venous: 2.9 mmol/L (ref 0.5–1.9)

## 2019-01-22 LAB — PROTIME-INR
INR: 5.2 (ref 0.8–1.2)
Prothrombin Time: 47 seconds — ABNORMAL HIGH (ref 11.4–15.2)

## 2019-01-22 LAB — HIV ANTIBODY (ROUTINE TESTING W REFLEX): HIV Screen 4th Generation wRfx: NONREACTIVE

## 2019-01-22 LAB — MRSA PCR SCREENING: MRSA by PCR: NEGATIVE

## 2019-01-22 MED ORDER — SODIUM CHLORIDE 0.9 % IV SOLN
2.0000 g | Freq: Three times a day (TID) | INTRAVENOUS | Status: DC
  Administered 2019-01-22 – 2019-01-27 (×16): 2 g via INTRAVENOUS
  Filled 2019-01-22 (×19): qty 2

## 2019-01-22 MED ORDER — LACTATED RINGERS IV BOLUS
500.0000 mL | Freq: Once | INTRAVENOUS | Status: AC
  Administered 2019-01-22: 500 mL via INTRAVENOUS

## 2019-01-22 MED ORDER — FOLIC ACID 1 MG PO TABS
1.0000 mg | ORAL_TABLET | Freq: Every day | ORAL | Status: DC
  Administered 2019-01-22 – 2019-02-02 (×11): 1 mg via ORAL
  Filled 2019-01-22 (×12): qty 1

## 2019-01-22 MED ORDER — ORAL CARE MOUTH RINSE
15.0000 mL | Freq: Two times a day (BID) | OROMUCOSAL | Status: DC
  Administered 2019-01-23 – 2019-02-01 (×16): 15 mL via OROMUCOSAL

## 2019-01-22 MED ORDER — PANTOPRAZOLE SODIUM 40 MG PO TBEC
40.0000 mg | DELAYED_RELEASE_TABLET | Freq: Every day | ORAL | Status: DC
  Administered 2019-01-22 – 2019-02-02 (×11): 40 mg via ORAL
  Filled 2019-01-22 (×12): qty 1

## 2019-01-22 MED ORDER — VITAMIN K1 10 MG/ML IJ SOLN
5.0000 mg | Freq: Once | INTRAVENOUS | Status: AC
  Administered 2019-01-22: 5 mg via INTRAVENOUS
  Filled 2019-01-22: qty 0.5

## 2019-01-22 MED ORDER — VITAMIN B-1 100 MG PO TABS
100.0000 mg | ORAL_TABLET | Freq: Every day | ORAL | Status: DC
  Administered 2019-01-22 – 2019-02-02 (×12): 100 mg via ORAL
  Filled 2019-01-22 (×12): qty 1

## 2019-01-22 MED ORDER — FUROSEMIDE 10 MG/ML IJ SOLN
40.0000 mg | Freq: Two times a day (BID) | INTRAMUSCULAR | Status: DC
  Administered 2019-01-22 (×2): 40 mg via INTRAVENOUS
  Filled 2019-01-22 (×2): qty 4

## 2019-01-22 MED ORDER — MAGNESIUM OXIDE 400 (241.3 MG) MG PO TABS
400.0000 mg | ORAL_TABLET | Freq: Every day | ORAL | Status: DC
  Administered 2019-01-22 – 2019-02-02 (×11): 400 mg via ORAL
  Filled 2019-01-22 (×12): qty 1

## 2019-01-22 MED ORDER — ADULT MULTIVITAMIN W/MINERALS CH
1.0000 | ORAL_TABLET | Freq: Every day | ORAL | Status: DC
  Administered 2019-01-22 – 2019-02-02 (×11): 1 via ORAL
  Filled 2019-01-22 (×12): qty 1

## 2019-01-22 MED ORDER — KETOROLAC TROMETHAMINE 30 MG/ML IJ SOLN
30.0000 mg | Freq: Once | INTRAMUSCULAR | Status: AC
  Administered 2019-01-22: 30 mg via INTRAVENOUS
  Filled 2019-01-22: qty 1

## 2019-01-22 NOTE — Consult Note (Signed)
Reason for Consult:empyema Referring Physician: TH  Grant [name] is an 38 y.o. male.  HPI: 38 yo man with a history of ethanol abuse, cirrhosis with ascites, portal vein thrombosis, esophageal varices, splenomegaly, thrombocytopenia and avascular necrosis. Has been ill since January. Multiple trips to the doctor/ ED. Has been treated with PO antibiotics and steroids. COVID test negative. Shortness of breath, cough and general malaise and weakness worsening over past 3 weeks. Presented to [facility] ED yesterday. Found to have decompensated liver failure, right lower lobe pneumonia and a right parapneumonic effusion. Coagulopathic with INR was 6.1 and hyponatremic with Na of 121. Lactic acidosis and elevated BNP also noted.  Still with persistent cough, "hard to breathe"  Past Medical History:  Diagnosis Date  . Ascites 2013  . Avascular necrosis (HCC)   . Campylobacter diarrhea 04/2016   bloody diarrhea.   . 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 Hepatitis B and C serologies negative 06/2016.     Marland Kitchen GERD (gastroesophageal reflux disease)   . Hypomagnesemia 01/31/2018  . Splenomegaly 2013  . Thrombocytopenia (HCC) 2013    Past Surgical History:  Procedure Laterality Date  . ESOPHAGOGASTRODUODENOSCOPY  08/2015   [place], Dr. 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 PARACENTESIS  2013   In [place] at [facility] associated hospital.     Family History  Problem Relation Age of Onset  . Diabetes Father   . Hypertension Father   . Colon polyps Father 31  . Diabetes Other   . Cancer Neg Hx   . Colon cancer Neg Hx   . Liver disease Neg Hx     Social History:  reports that he has never smoked. He has never used smokeless tobacco. He reports previous alcohol use of about 20.0 standard drinks of alcohol per week. He reports that he does not use drugs.  Allergies: No Known Allergies  Medications:  Scheduled: .  folic acid  1 mg Oral Daily  . furosemide  40 mg Intravenous BID  . magnesium oxide  400 mg Oral Daily  . mouth rinse  15 mL Mouth Rinse BID  . multivitamin with minerals  1 tablet Oral Daily  . pantoprazole  40 mg Oral Daily  . potassium chloride  40 mEq Oral BID  . thiamine  100 mg Oral Daily    Results for orders placed or performed during the hospital encounter of 01/21/19 (from the past 48 hour(s))  CBC with Differential     Status: Abnormal   Collection Time: 01/21/19  3:36 PM  Result Value Ref Range   WBC 17.0 (H) 4.0 - 10.5 K/uL   RBC 3.46 (L) 4.22 - 5.81 MIL/uL   Hemoglobin 11.8 (L) 13.0 - 17.0 g/dL   HCT 82.9 (L) 56.2 - 13.0 %   MCV 96.0 80.0 - 100.0 fL   MCH 34.1 (H) 26.0 - 34.0 pg   MCHC 35.5 30.0 - 36.0 g/dL   RDW 86.5 78.4 - 69.6 %   Platelets 171 150 - 400 K/uL   nRBC 0.0 0.0 - 0.2 %   Neutrophils Relative % 78 %   Neutro Abs 13.6 (H) 1.7 - 7.7 K/uL   Lymphocytes Relative 9 %   Lymphs Abs 1.5 0.7 - 4.0 K/uL   Monocytes Relative 8 %   Monocytes Absolute 1.3 (H) 0.1 - 1.0 K/uL   Eosinophils Relative 1 %   Eosinophils Absolute 0.1 0.0 -  0.5 K/uL   Basophils Relative 1 %   Basophils Absolute 0.1 0.0 - 0.1 K/uL   Immature Granulocytes 3 %   Abs Immature Granulocytes 0.42 (H) 0.00 - 0.07 K/uL    Comment: Performed at [facility], [address] [place], [place] [address]  Comprehensive metabolic panel     Status: Abnormal   Collection Time: 01/21/19  3:36 PM  Result Value Ref Range   Sodium 121 (L) 135 - 145 mmol/L   Potassium 3.4 (L) 3.5 - 5.1 mmol/L   Chloride 88 (L) 98 - 111 mmol/L   CO2 22 22 - 32 mmol/L   Glucose, Bld 117 (H) 70 - 99 mg/dL   BUN 7 6 - 20 mg/dL   Creatinine, Ser 6.04 (L) 0.61 - 1.24 mg/dL   Calcium 7.9 (L) 8.9 - 10.3 mg/dL   Total Protein 8.5 (H) 6.5 - 8.1 g/dL   Albumin 2.2 (L) 3.5 - 5.0 g/dL   AST 540 (H) 15 - 41 U/L   ALT 41 0 - 44 U/L   Alkaline Phosphatase 170 (H) 38 - 126 U/L   Total Bilirubin 2.9 (H) 0.3 - 1.2 mg/dL   GFR  calc non Af Amer >60 >60 mL/min   GFR calc Af Amer >60 >60 mL/min   Anion gap 11 5 - 15    Comment: Performed at [facility], [address] [place], [place] [address]  SARS Coronavirus 2 [facility] order, Performed in [facility] hospital lab)     Status: None   Collection Time: 01/21/19  3:58 PM  Result Value Ref Range   SARS Coronavirus 2 NEGATIVE NEGATIVE    Comment: (NOTE) If result is NEGATIVE SARS-CoV-2 target nucleic acids are NOT DETECTED. The SARS-CoV-2 RNA is generally detectable in upper and lower  respiratory specimens during the acute phase of infection. The lowest  concentration of SARS-CoV-2 viral copies this assay can detect is 250  copies / mL. A negative result does not preclude SARS-CoV-2 infection  and should not be used as the sole basis for treatment or other  patient management decisions.  A negative result may occur with  improper specimen collection / handling, submission of specimen other  than nasopharyngeal swab, presence of viral mutation(s) within the  areas targeted by this assay, and inadequate number of viral copies  (<250 copies / mL). A negative result must be combined with clinical  observations, patient history, and epidemiological information. If result is POSITIVE SARS-CoV-2 target nucleic acids are DETECTED. The SARS-CoV-2 RNA is generally detectable in upper and lower  respiratory specimens dur ing the acute phase of infection.  Positive  results are indicative of active infection with SARS-CoV-2.  Clinical  correlation with patient history and other diagnostic information is  necessary to determine patient infection status.  Positive results do  not rule out bacterial infection or co-infection with other viruses. If result is PRESUMPTIVE POSTIVE SARS-CoV-2 nucleic acids MAY BE PRESENT.   A presumptive positive result was obtained on the submitted specimen  and confirmed on repeat testing.  While 2019 novel coronavirus  (SARS-CoV-2) nucleic  acids may be present in the submitted sample  additional confirmatory testing may be necessary for epidemiological  and / or clinical management purposes  to differentiate between  SARS-CoV-2 and other Sarbecovirus currently known to infect humans.  If clinically indicated additional testing with an alternate test  methodology [id number]) is advised. The SARS-CoV-2 RNA is generally  detectable in upper and lower respiratory sp ecimens during  the acute  phase of infection. The expected result is Negative. Fact Sheet for Patients:  BoilerBrush.com.cy Fact Sheet for Healthcare Providers: https://pope.com/ This test is not yet approved or cleared by the [place] FDA and has been authorized for detection and/or diagnosis of SARS-CoV-2 by FDA under an Emergency Use Authorization (EUA).  This EUA will remain in effect (meaning this test can be used) for the duration of the COVID-19 declaration under Section 564(b)(1) of the Act, 21 U.S.C. section 360bbb-3(b)(1), unless the authorization is terminated or revoked sooner. Performed at [facility], [address] [place], [place] [address]   Urinalysis, Routine w reflex microscopic     Status: Abnormal   Collection Time: 01/21/19  4:13 PM  Result Value Ref Range   Color, Urine AMBER (A) YELLOW    Comment: BIOCHEMICALS MAY BE AFFECTED BY COLOR   APPearance CLEAR CLEAR   Specific Gravity, Urine 1.012 1.005 - 1.030   pH 5.0 5.0 - 8.0   Glucose, UA NEGATIVE NEGATIVE mg/dL   Hgb urine dipstick LARGE (A) NEGATIVE   Bilirubin Urine NEGATIVE NEGATIVE   Ketones, ur NEGATIVE NEGATIVE mg/dL   Protein, ur NEGATIVE NEGATIVE mg/dL   Nitrite NEGATIVE NEGATIVE   Leukocytes,Ua TRACE (A) NEGATIVE   RBC / HPF >50 (H) 0 - 5 RBC/hpf   WBC, UA 6-10 0 - 5 WBC/hpf   Bacteria, UA RARE (A) NONE SEEN   Squamous Epithelial / LPF 0-5 0 - 5   Mucus PRESENT    Hyaline Casts, UA PRESENT     Comment: Performed at  [facility], [address], [place], [place] [address]  Lipase, blood     Status: None   Collection Time: 01/21/19  4:48 PM  Result Value Ref Range   Lipase 37 11 - 51 U/L    Comment: Performed at [facility], [address] [place], [place] [address]  Lactic acid, plasma     Status: Abnormal   Collection Time: 01/21/19  4:48 PM  Result Value Ref Range   Lactic Acid, Venous 2.6 (HH) 0.5 - 1.9 mmol/L    Comment: CRITICAL RESULT CALLED TO, READ BACK BY AND VERIFIED WITH: KEMP,C @ 1745 ON 01/21/19 BY JUW Performed at [facility], [address] [place], [place] [address]   Protime-INR     Status: Abnormal   Collection Time: 01/21/19  4:48 PM  Result Value Ref Range   Prothrombin Time 53.4 (H) 11.4 - 15.2 seconds   INR 6.1 (HH) 0.8 - 1.2    Comment: REPEATED TO VERIFY CRITICAL RESULT CALLED TO, READ BACK BY AND VERIFIED WITH: CREWS M. @ 1731 ON [id number] BY HENDERSON L. (NOTE) INR goal varies based on device and disease states. Performed at [facility], [address] [place], [place] [address]   Brain natriuretic peptide     Status: Abnormal   Collection Time: 01/21/19  4:48 PM  Result Value Ref Range   B Natriuretic Peptide 318.0 (H) 0.0 - 100.0 pg/mL    Comment: Performed at [facility], [address], [place], [place] [address]  Culture, blood (Routine X 2) w Reflex to ID Panel     Status: None (Preliminary result)   Collection Time: 01/21/19  4:53 PM  Result Value Ref Range   Specimen Description      LEFT ANTECUBITAL BOTTLES DRAWN AEROBIC AND ANAEROBIC   Special Requests Blood Culture adequate volume    Culture      NO GROWTH < 24 HOURS Performed at [facility], [address] [place], [place] [address]    Report Status PENDING   Culture, blood (Routine X 2) w Reflex to ID Panel     Status: None (Preliminary result)   Collection Time: 01/21/19  4:54 PM  Result Value Ref Range   Specimen Description BLOOD LEFT ARM BOTTLES DRAWN AEROBIC ONLY    Special Requests Blood  Culture adequate volume    Culture      NO GROWTH < 24 HOURS Performed at [facility], [address] [place], [place] [address]    Report Status PENDING   Comprehensive metabolic panel     Status: Abnormal   Collection Time: 01/22/19 12:49 AM  Result Value Ref Range   Sodium 122 (L) 135 - 145 mmol/L   Potassium 3.4 (L) 3.5 - 5.1 mmol/L   Chloride 89 (L) 98 - 111 mmol/L   CO2 20 (L) 22 - 32 mmol/L   Glucose, Bld 104 (H) 70 - 99 mg/dL   BUN 5 (L) 6 - 20 mg/dL   Creatinine, Ser 3.57 0.61 - 1.24 mg/dL   Calcium 8.0 (L) 8.9 - 10.3 mg/dL   Total Protein 7.9 6.5 - 8.1 g/dL   Albumin 1.7 (L) 3.5 - 5.0 g/dL   AST 017 (H) 15 - 41 U/L   ALT 40 0 - 44 U/L   Alkaline Phosphatase 154 (H) 38 - 126 U/L   Total Bilirubin 3.1 (H) 0.3 - 1.2 mg/dL   GFR calc non Af Amer >60 >60 mL/min   GFR calc Af Amer >60 >60 mL/min   Anion gap 13 5 - 15    Comment: Performed at [facility], [address] [place], [place] [address]  CBC     Status: Abnormal   Collection Time: 01/22/19 12:49 AM  Result Value Ref Range   WBC 15.6 (H) 4.0 - 10.5 K/uL   RBC 3.20 (L) 4.22 - 5.81 MIL/uL   Hemoglobin 10.8 (L) 13.0 - 17.0 g/dL   HCT 30.0 (L) 92.3 - 30.0 %   MCV 94.7 80.0 - 100.0 fL   MCH 33.8 26.0 - 34.0 pg   MCHC 35.6 30.0 - 36.0 g/dL   RDW 76.2 26.3 - 33.5 %   Platelets 145 (L) 150 - 400 K/uL   nRBC 0.0 0.0 - 0.2 %    Comment: Performed at [facility], [address] [place], [place] [address]  Protime-INR     Status: Abnormal   Collection Time: 01/22/19 12:49 AM  Result Value Ref Range   Prothrombin Time 47.0 (H) 11.4 - 15.2 seconds   INR 5.2 (HH) 0.8 - 1.2    Comment: REPEATED TO VERIFY CRITICAL RESULT CALLED TO, READ BACK BY AND VERIFIED WITH: Ronald Lobo [id number] 01/22/2019 T. TYSOR (NOTE) INR goal varies based on device and disease states. Performed at [facility], [address], [place], [place] [address]   Lactic acid, plasma     Status: Abnormal   Collection Time:  01/22/19 12:49 AM  Result Value Ref Range   Lactic Acid, Venous 2.9 (HH) 0.5 - 1.9 mmol/L    Comment: CRITICAL RESULT CALLED TO, READ BACK BY AND VERIFIED WITH: PIGEON E,RN 01/22/19 0132 WAYK Performed at [facility], [address] [place], [place] [address]   MRSA PCR Screening     Status: None   Collection Time: 01/22/19  1:00 AM  Result Value Ref Range   MRSA by PCR NEGATIVE NEGATIVE  Comment:        The GeneXpert MRSA Assay (FDA approved for NASAL specimens only), is one component of a comprehensive MRSA colonization surveillance program. It is not intended to diagnose MRSA infection nor to guide or monitor treatment for MRSA infections. Performed at [facility], [address] [place], [place] [address]   Strep pneumoniae urinary antigen     Status: None   Collection Time: 01/22/19  1:50 AM  Result Value Ref Range   Strep Pneumo Urinary Antigen NEGATIVE NEGATIVE    Comment: Performed at [facility], [address] [place], [place] [address]  Culture, sputum-assessment     Status: None   Collection Time: 01/22/19  5:02 AM  Result Value Ref Range   Specimen Description SPUTUM    Special Requests Normal    Sputum evaluation      THIS SPECIMEN IS ACCEPTABLE FOR SPUTUM CULTURE Performed at [facility], [address] [place], [place] [address]    Report Status 01/22/2019 FINAL   Culture, respiratory     Status: None (Preliminary result)   Collection Time: 01/22/19  5:02 AM  Result Value Ref Range   Specimen Description SPUTUM    Special Requests Normal Reflexed from [id number]    Gram Stain      MODERATE WBC PRESENT, PREDOMINANTLY PMN FEW GRAM NEGATIVE RODS RARE GRAM POSITIVE COCCI IN PAIRS Performed at [facility], [address] [place], [place] [address]    Culture PENDING    Report Status PENDING     Ct Chest Wo Contrast  Result Date: 01/21/2019 CLINICAL DATA:  Right lower lobe opacity and complex pleural fluid seen on  prior abdominal CT. EXAM: CT CHEST WITHOUT CONTRAST TECHNIQUE: Multidetector CT imaging of the chest was performed following the standard protocol without IV contrast. COMPARISON:  CT abdomen 01/21/2019 FINDINGS: Cardiovascular: Heart is normal size. Aorta is normal caliber. Coronary artery calcification in the left anterior descending coronary artery. Mediastinum/Nodes: No mediastinal, hilar, or axillary adenopathy. Lungs/Pleura: Dense consolidation noted in the right lower lobe compatible with pneumonia. Loculated pleural fluid noted along the right heart border and superior laterally in the right hemithorax. There is a rounded fluid collection posteriorly in the right lower hemithorax measuring 7.2 cm concerning for empyema or pulmonary abscess. On coronal imaging, this extends over a craniocaudal length of 10 cm. Dependent atelectasis in the left lower lobe. Upper Abdomen: Changes of cirrhosis with ascites. Musculoskeletal: No acute bony abnormality. Chest wall soft tissues are unremarkable. IMPRESSION: Dense consolidation in the right lower lobe compatible with pneumonia. Loculated right pleural fluid. Rounded fluid collection posteriorly in the right lower hemithorax which could be loculated fluid and reflect empyema or pulmonary abscess. Coronary artery disease. Cirrhosis, ascites. Electronically Signed   By: Charlett NoseKevin  Dover M.D.   On: 01/21/2019 20:28   Ct Abdomen Pelvis W Contrast  Result Date: 01/21/2019 CLINICAL DATA:  Coughing since January. History of thrombocytopenia, splenomegaly and cirrhosis. Recently diagnosed with pneumonia. EXAM: CT ABDOMEN AND PELVIS WITH CONTRAST TECHNIQUE: Multidetector CT imaging of the abdomen and pelvis was performed using the standard protocol following bolus administration of intravenous contrast. CONTRAST:  100mL OMNIPAQUE IOHEXOL 300 MG/ML  SOLN COMPARISON:  Chest radiographs today. Abdominopelvic CT 05/08/2016. Right upper quadrant ultrasound 06/30/2018. FINDINGS:  Lower chest: The lung bases are incompletely visualized. There is consolidation of the visualized right lower lobe with a large loculated right pleural effusion. This has a component posteriorly with thick peripheral enhancement,  measuring up to 7.9 x 5.2 cm on image 9/2. Based on the provided history, this is suspicious for empyema and possible pleural-based abscess. There is minimal atelectasis at the left lung base and no significant left pleural effusion. The azygos and hemi azygous veins are mildly dilated. Hepatobiliary: There are progressive changes of cirrhosis throughout the liver with diffuse contour irregularity and parenchymal heterogeneity. No dominant mass or focally abnormal enhancement identified. The portal vein is patent. No evidence of gallstones, gallbladder wall thickening or biliary dilatation. Pancreas: Unremarkable. No pancreatic ductal dilatation or surrounding inflammatory changes. Spleen: Measures approximately 14.7 x 7.5 x 12.3 cm (volume = 710 cm^3) consistent with mild splenomegaly. No focal abnormality. Adrenals/Urinary Tract: Both adrenal glands appear normal. The kidneys, ureters and bladder appear normal. Stomach/Bowel: No evidence of bowel wall thickening, distention or surrounding inflammatory change. There is a large amount of stool within the rectum. There is a probable loop of small bowel extending into an umbilical hernia. No evidence of bowel obstruction or incarceration. Vascular/Lymphatic: There are no enlarged abdominal or pelvic lymph nodes. Minimal aortic atherosclerosis. No acute vascular findings are identified. 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 The portal, superior mesenteric and splenic veins appear patent. Reproductive: The prostate gland and seminal vesicles appear normal. Other: Interval development of a moderate amount of ascites. Fluid extends into an umbilical hernia which measures up to 8.6 cm transverse on image 73/2. As  above, there is air within this hernia as well which appears to be due to a small knuckle of small bowel. Musculoskeletal: No acute osseous findings. There are old rib fractures on the left. There are changes of bilateral femoral head avascular necrosis with early subchondral collapse on the right. There is edema throughout the subcutaneous fat. In the upper right buttocks, there is a globular area of increased signal which could be related to prior trauma/injection or reflect fat necrosis. IMPRESSION: 1. Right lower lobe pulmonary consolidation with complex fluid in the right pleural space, worrisome for empyema and pleural-based abscess. Full chest CT may be helpful for further evaluation. 2. Significantly progressive changes of diffuse hepatic cirrhosis compared with previous CT from 2017. Mild changes of portal hypertension with splenomegaly and venous collaterals. The main portal vein appears patent. 3. Progressive moderate volume ascites. Ascites and a small bowel loop extend into an umbilical hernia. No evidence of bowel obstruction or incarceration. 4. Chronic bilateral femoral head avascular necrosis. Electronically Signed   By: Carey Bullocks M.D.   On: 01/21/2019 19:37   Dg Chest Portable 1 View  Result Date: 01/21/2019 CLINICAL DATA:  38 year old with current history of hepatic cirrhosis and, by the patient's report, recent diagnosis of pneumonia at an outside facility. EXAM: PORTABLE CHEST 1 VIEW COMPARISON:  02/21/2018 and earlier, including CTA chest 05/13/2015. FINDINGS: Cardiac silhouette moderately to markedly enlarged, unchanged since 2019 but increased in size since 2016. Pulmonary venous hypertension with perhaps minimal interstitial pulmonary edema. Large RIGHT pleural effusion and associated consolidation in the RIGHT LOWER LOBE. Lungs otherwise clear. No visible LEFT pleural effusion. IMPRESSION: 1. Large RIGHT pleural effusion and associated passive atelectasis and/or pneumonia  involving the RIGHT LOWER LOBE. 2. Stable moderate to marked cardiomegaly. Pulmonary venous hypertension with perhaps minimal interstitial pulmonary edema indicating CHF and/or fluid overload. Electronically Signed   By: Hulan Saas M.D.   On: 01/21/2019 16:25  I personally reviewed the CXR and CT chest images and concur with the findings noted above  Review of Systems  Constitutional: Positive for fever and malaise/fatigue.  Respiratory: Positive for cough and shortness of breath.   Cardiovascular: Positive for leg swelling.  All other systems reviewed and are negative.  Blood pressure 111/64, pulse 89, temperature 98.1 F (36.7 C), temperature source Oral, resp. rate 18, height 5\' 10"  (1.778 m), weight 99.8 kg, SpO2 100 %. Physical Exam  Vitals reviewed. Constitutional: He is oriented to person, place, and time.  Obese 38 yo man with increased WOB  Eyes: EOM are normal. Scleral icterus is present.  Cardiovascular: Normal rate, regular rhythm and normal heart sounds.  No murmur heard. Respiratory: No respiratory distress. He has no wheezes. He has no rales.  Increased WOB, absent BS right base  GI: Soft. He exhibits distension. There is no abdominal tenderness.  Musculoskeletal:        General: Edema (3+) present.  Neurological: He is alert and oriented to person, place, and time. No cranial nerve deficit. He exhibits normal muscle tone. Coordination normal.  Skin: Skin is warm and dry.  Extensive ecchymoses  Psychiatric: He has a normal mood and affect.    Assessment/Plan: 38 yo man with cirrhosis with decompensated liver failure who presents with a 4 month history of respiratory issues and is found to have a densely consolidated RLL pneumonia and a loculated para-pneumonic effusion.  He is not an operative candidate in the setting of decompensated hepatic failure with ascites, peripheral edema and severe coagulopathy.   I think the best option for him is to have IR place a  percutaneous drain in the right pleural fluid collection once his coagulopathy is improved to the point that is possible. We can then consider intrapleural thrombolytics if needed.   Continue with IV antibiotics, correct electrolytes  Management of hepatic issues per Hospitalists   Loreli Slot 01/22/2019, 11:09 AM

## 2019-01-22 NOTE — Plan of Care (Signed)
Patieny febrile with decrease activity tolerance, O2 Sat >90  RA, will encourage oxygen use, continue ABT per order, Tylenol as prescribred and monitor labs.

## 2019-01-22 NOTE — Progress Notes (Signed)
Paged daytime MD re: new tremors, nausea. Mild tachycardia noted as well. Will continue to monitor for S/S of withdrawal.  Lorenza [place], RN 01/22/19 7:22 PM

## 2019-01-22 NOTE — Progress Notes (Signed)
Patient ID: Grant [name], male   DOB: 02/04/1981, 38 y.o.   MRN: [id number]  PROGRESS NOTE    Grant [name]  NFA:[id number] DOB: 04/17/1981 DOA: 01/21/2019 PCP: Patient, No Pcp Per   Brief Narrative:  38 year old male with history of alcoholic cirrhosis, portal vein thrombosis diagnosed in 06/2018 for which he is currently on Xarelto, severe microcytic anemia, GERD, splenomegaly presented with shortness of breath and cough worsening over the past 3 weeks, with COVID-19 testing done as an outpatient 3 weeks ago apparently negative.  On presentation he was found to have right lower lobe pneumonia with loculated pleural fluid, likely empyema.  He was also found to have elevated INR along with hyponatremia.  CT surgery was consulted who recommended [facility].  Assessment & Plan:   Principal Problem:   Acute respiratory failure with hypoxia (HCC) Active Problems:   Alcoholic cirrhosis of liver with ascites (HCC)   Esophageal reflux   Morbid obesity due to excess calories (HCC)   Hypokalemia   Ascites   RLL pneumonia (HCC)   Empyema of right pleural space (HCC)   Hyponatremia   Supratherapeutic INR   Lactic acidosis   Right lower lobar bacterial pneumonia with loculated pleural effusion/likely empyema with hypoxia -Patient has been empirically started on vancomycin and cefepime.  Follow cultures.  Currently requiring 2 L oxygen via nasal cannula. -CT surgery was consulted on admission.  Evaluation pending. -Strep pneumo urinary antigen negative -COVID-19 testing negative  Decompensated alcoholic cirrhosis of liver with ascites with elevated LFTs -CT showing moderate ascites.  No evidence of SBP -Patient follows up with gastroenterology as an outpatient.  No recent alcohol use. -Takes Lasix and spironolactone at home.  Will start intravenous Lasix and resume spironolactone if blood pressure allows.  Hyponatremia -Most likely hypervolemic.  Sodium 122 today.  Fluid  restriction.  Intravenous Lasix.  Monitor sodium  Supratherapeutic INR -Patient takes Xarelto at home.  Xarelto on hold. -INR 5.2 today.  No signs of bleeding.  Will give vitamin K 5 mg x 1 IV as patient might need thoracic surgery.  Repeat INR in a.m.  Leukocytosis -From pneumonia and empyema.  Improving. monitor  Hypokalemia -Replace.  Repeat a.m. labs  GERD -Continue PPI  Chronic anemia -Hemoglobin stable.  Monitor.  Thrombocytopenia -Most likely from cirrhosis of liver.  No signs of bleeding.  Monitor  History of portal vein thrombosis -Hold Xarelto   DVT prophylaxis: SCDs Code Status: Full Family Communication: None at bedside Disposition Plan: Depends on clinical outcome  Consultants: CT surgery  Procedures: None  Antimicrobials: Vancomycin and cefepime from 01/21/2019 onwards   Subjective: Patient seen and examined at bedside.  He denies any worsening shortness of breath or fever or cough.  No abdominal pain, vomiting or diarrhea.  Objective: Vitals:   01/21/19 2100 01/21/19 2210 01/21/19 2231 01/22/19 0003  BP: 104/68 (!) 103/55 (!) 112/52 (!) 117/51  Pulse: 77 74 78 76  Resp: 19 (!) 21 (!) 24   Temp:    97.8 F (36.6 C)  TempSrc:    Oral  SpO2: 93% 92% 95% 95%  Weight:      Height:    5\' 10"  (1.778 m)    Intake/Output Summary (Last 24 hours) at 01/22/2019 0922 Last data filed at 01/22/2019 0600 Gross per 24 hour  Intake 1110.98 ml  Output 400 ml  Net 710.98 ml   Filed Weights   01/21/19 1511  Weight: 99.8 kg    Examination:  General exam:  Appears calm and comfortable.  Looks chronically ill.  Older than stated age Respiratory system: Bilateral decreased breath sounds at bases with scattered crackles.  Intermittent tachypnea Cardiovascular system: S1 & S2 heard, Rate controlled Gastrointestinal system: Abdomen is obese, distended, soft and nontender. Normal bowel sounds heard. Extremities: No cyanosis, clubbing; 1-2+ pitting pedal edema   Central nervous system: Alert and oriented. No focal neurological deficits. Moving extremities Skin: Several bruises.  No other rash or ulceration. Psychiatry: Flat affect.    Data Reviewed: I have personally reviewed following labs and imaging studies  CBC: Recent Labs  Lab 01/21/19 1536 01/22/19 0049  WBC 17.0* 15.6*  NEUTROABS 13.6*  --   HGB 11.8* 10.8*  HCT 33.2* 30.3*  MCV 96.0 94.7  PLT 171 145*   Basic Metabolic Panel: Recent Labs  Lab 01/21/19 1536 01/22/19 0049  NA 121* 122*  K 3.4* 3.4*  CL 88* 89*  CO2 22 20*  GLUCOSE 117* 104*  BUN 7 5*  CREATININE 0.60* 0.64  CALCIUM 7.9* 8.0*   GFR: Estimated Creatinine Clearance: 149.7 mL/min (by C-G formula based on SCr of 0.64 mg/dL). Liver Function Tests: Recent Labs  Lab 01/21/19 1536 01/22/19 0049  AST 127* 129*  ALT 41 40  ALKPHOS 170* 154*  BILITOT 2.9* 3.1*  PROT 8.5* 7.9  ALBUMIN 2.2* 1.7*   Recent Labs  Lab 01/21/19 1648  LIPASE 37   No results for input(s): AMMONIA in the last 168 hours. Coagulation Profile: Recent Labs  Lab 01/21/19 1648 01/22/19 0049  INR 6.1* 5.2*   Cardiac Enzymes: No results for input(s): CKTOTAL, CKMB, CKMBINDEX, TROPONINI in the last 168 hours. BNP (last 3 results) No results for input(s): PROBNP in the last 8760 hours. HbA1C: No results for input(s): HGBA1C in the last 72 hours. CBG: No results for input(s): GLUCAP in the last 168 hours. Lipid Profile: No results for input(s): CHOL, HDL, LDLCALC, TRIG, CHOLHDL, LDLDIRECT in the last 72 hours. Thyroid Function Tests: No results for input(s): TSH, T4TOTAL, FREET4, T3FREE, THYROIDAB in the last 72 hours. Anemia Panel: No results for input(s): VITAMINB12, FOLATE, FERRITIN, TIBC, IRON, RETICCTPCT in the last 72 hours. Sepsis Labs: Recent Labs  Lab 01/21/19 1648 01/22/19 0049  LATICACIDVEN 2.6* 2.9*    Recent Results (from the past 240 hour(s))  SARS Coronavirus 2 [facility] order, Performed in [facility] hospital lab)     Status: None   Collection Time: 01/21/19  3:58 PM  Result Value Ref Range Status   SARS Coronavirus 2 NEGATIVE NEGATIVE Final    Comment: (NOTE) If result is NEGATIVE SARS-CoV-2 target nucleic acids are NOT DETECTED. The SARS-CoV-2 RNA is generally detectable in upper and lower  respiratory specimens during the acute phase of infection. The lowest  concentration of SARS-CoV-2 viral copies this assay can detect is 250  copies / mL. A negative result does not preclude SARS-CoV-2 infection  and should not be used as the sole basis for treatment or other  patient management decisions.  A negative result may occur with  improper specimen collection / handling, submission of specimen other  than nasopharyngeal swab, presence of viral mutation(s) within the  areas targeted by this assay, and inadequate number of viral copies  (<250 copies / mL). A negative result must be combined with clinical  observations, patient history, and epidemiological information. If result is POSITIVE SARS-CoV-2 target nucleic acids are DETECTED. The SARS-CoV-2 RNA is generally detectable in upper and lower  respiratory specimens dur ing the acute  phase of infection.  Positive  results are indicative of active infection with SARS-CoV-2.  Clinical  correlation with patient history and other diagnostic information is  necessary to determine patient infection status.  Positive results do  not rule out bacterial infection or co-infection with other viruses. If result is PRESUMPTIVE POSTIVE SARS-CoV-2 nucleic acids MAY BE PRESENT.   A presumptive positive result was obtained on the submitted specimen  and confirmed on repeat testing.  While 2019 novel coronavirus  (SARS-CoV-2) nucleic acids may be present in the submitted sample  additional confirmatory testing may be necessary for epidemiological  and / or clinical management purposes  to differentiate between  SARS-CoV-2 and other  Sarbecovirus currently known to infect humans.  If clinically indicated additional testing with an alternate test  methodology [phone]) is advised. The SARS-CoV-2 RNA is generally  detectable in upper and lower respiratory sp ecimens during the acute  phase of infection. The expected result is Negative. Fact Sheet for Patients:  BoilerBrush.com.cy Fact Sheet for Healthcare Providers: https://pope.com/ This test is not yet approved or cleared by the [place] FDA and has been authorized for detection and/or diagnosis of SARS-CoV-2 by FDA under an Emergency Use Authorization (EUA).  This EUA will remain in effect (meaning this test can be used) for the duration of the COVID-19 declaration under Section 564(b)(1) of the Act, 21 U.S.C. section 360bbb-3(b)(1), unless the authorization is terminated or revoked sooner. Performed at [facility], [address] [place], [place] [address]   Culture, blood (Routine X 2) w Reflex to ID Panel     Status: None (Preliminary result)   Collection Time: 01/21/19  4:53 PM  Result Value Ref Range Status   Specimen Description   Final    LEFT ANTECUBITAL BOTTLES DRAWN AEROBIC AND ANAEROBIC   Special Requests Blood Culture adequate volume  Final   Culture   Final    NO GROWTH < 24 HOURS Performed at [facility], [address], [place], [place] [address]    Report Status PENDING  Incomplete  Culture, blood (Routine X 2) w Reflex to ID Panel     Status: None (Preliminary result)   Collection Time: 01/21/19  4:54 PM  Result Value Ref Range Status   Specimen Description BLOOD LEFT ARM BOTTLES DRAWN AEROBIC ONLY  Final   Special Requests Blood Culture adequate volume  Final   Culture   Final    NO GROWTH < 24 HOURS Performed at [facility], [address] [place], [place] [address]    Report Status PENDING  Incomplete  MRSA PCR Screening     Status: None   Collection Time: 01/22/19  1:00 AM   Result Value Ref Range Status   MRSA by PCR NEGATIVE NEGATIVE Final    Comment:        The GeneXpert MRSA Assay (FDA approved for NASAL specimens only), is one component of a comprehensive MRSA colonization surveillance program. It is not intended to diagnose MRSA infection nor to guide or monitor treatment for MRSA infections. Performed at [facility], [address] [place], [place] [address]   Culture, sputum-assessment     Status: None   Collection Time: 01/22/19  5:02 AM  Result Value Ref Range Status   Specimen Description SPUTUM  Final   Special Requests Normal  Final   Sputum evaluation   Final    THIS SPECIMEN IS ACCEPTABLE FOR SPUTUM CULTURE Performed at [facility], [address] [place],  [place] [address]    Report Status 01/22/2019 FINAL  Final  Culture, respiratory     Status: None (Preliminary result)   Collection Time: 01/22/19  5:02 AM  Result Value Ref Range Status   Specimen Description SPUTUM  Final   Special Requests Normal Reflexed from [id number]  Final   Gram Stain   Final    MODERATE WBC PRESENT, PREDOMINANTLY PMN FEW GRAM NEGATIVE RODS RARE GRAM POSITIVE COCCI IN PAIRS Performed at [facility], [address], [place], [place] [address]    Culture PENDING  Incomplete   Report Status PENDING  Incomplete         Radiology Studies: Ct Chest Wo Contrast  Result Date: 01/21/2019 CLINICAL DATA:  Right lower lobe opacity and complex pleural fluid seen on prior abdominal CT. EXAM: CT CHEST WITHOUT CONTRAST TECHNIQUE: Multidetector CT imaging of the chest was performed following the standard protocol without IV contrast. COMPARISON:  CT abdomen 01/21/2019 FINDINGS: Cardiovascular: Heart is normal size. Aorta is normal caliber. Coronary artery calcification in the left anterior descending coronary artery. Mediastinum/Nodes: No mediastinal, hilar, or axillary adenopathy. Lungs/Pleura: Dense consolidation noted in the right lower lobe  compatible with pneumonia. Loculated pleural fluid noted along the right heart border and superior laterally in the right hemithorax. There is a rounded fluid collection posteriorly in the right lower hemithorax measuring 7.2 cm concerning for empyema or pulmonary abscess. On coronal imaging, this extends over a craniocaudal length of 10 cm. Dependent atelectasis in the left lower lobe. Upper Abdomen: Changes of cirrhosis with ascites. Musculoskeletal: No acute bony abnormality. Chest wall soft tissues are unremarkable. IMPRESSION: Dense consolidation in the right lower lobe compatible with pneumonia. Loculated right pleural fluid. Rounded fluid collection posteriorly in the right lower hemithorax which could be loculated fluid and reflect empyema or pulmonary abscess. Coronary artery disease. Cirrhosis, ascites. Electronically Signed   By: Charlett Nose M.D.   On: 01/21/2019 20:28   Ct Abdomen Pelvis W Contrast  Result Date: 01/21/2019 CLINICAL DATA:  Coughing since January. History of thrombocytopenia, splenomegaly and cirrhosis. Recently diagnosed with pneumonia. EXAM: CT ABDOMEN AND PELVIS WITH CONTRAST TECHNIQUE: Multidetector CT imaging of the abdomen and pelvis was performed using the standard protocol following bolus administration of intravenous contrast. CONTRAST:  OMNIPAQUE IOHEXOL 300 MG/ML  SOLN COMPARISON:  Chest radiographs today. Abdominopelvic CT 05/08/2016. Right upper quadrant ultrasound 06/30/2018. FINDINGS: Lower chest: The lung bases are incompletely visualized. There is consolidation of the visualized right lower lobe with a large loculated right pleural effusion. This has a component posteriorly with thick peripheral enhancement, measuring up to 7.9 x 5.2 cm on image 9/2. Based on the provided history, this is suspicious for empyema and possible pleural-based abscess. There is minimal atelectasis at the left lung base and no significant left pleural effusion. The azygos and hemi  azygous veins are mildly dilated. Hepatobiliary: There are progressive changes of cirrhosis throughout the liver with diffuse contour irregularity and parenchymal heterogeneity. No dominant mass or focally abnormal enhancement identified. The portal vein is patent. No evidence of gallstones, gallbladder wall thickening or biliary dilatation. Pancreas: Unremarkable. No pancreatic ductal dilatation or surrounding inflammatory changes. Spleen: Measures approximately 14.7 x 7.5 x 12.3 cm (volume = 710 cm^3) consistent with mild splenomegaly. No focal abnormality. Adrenals/Urinary Tract: Both adrenal glands appear normal. The kidneys, ureters and bladder appear normal. Stomach/Bowel: No evidence of bowel wall thickening, distention or surrounding inflammatory change. There is a large amount of stool within the rectum. There  is a probable loop of small bowel extending into an umbilical hernia. No evidence of bowel obstruction or incarceration. Vascular/Lymphatic: There are no enlarged abdominal or pelvic lymph nodes. Minimal aortic atherosclerosis. No acute vascular findings are identified. 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 The portal, superior mesenteric and splenic veins appear patent. Reproductive: The prostate gland and seminal vesicles appear normal. Other: Interval development of a moderate amount of ascites. Fluid extends into an umbilical hernia which measures up to 8.6 cm transverse on image 73/2. As above, there is air within this hernia as well which appears to be due to a small knuckle of small bowel. Musculoskeletal: No acute osseous findings. There are old rib fractures on the left. There are changes of bilateral femoral head avascular necrosis with early subchondral collapse on the right. There is edema throughout the subcutaneous fat. In the upper right buttocks, there is a globular area of increased signal which could be related to prior trauma/injection or reflect  fat necrosis. IMPRESSION: 1. Right lower lobe pulmonary consolidation with complex fluid in the right pleural space, worrisome for empyema and pleural-based abscess. Full chest CT may be helpful for further evaluation. 2. Significantly progressive changes of diffuse hepatic cirrhosis compared with previous CT from 2017. Mild changes of portal hypertension with splenomegaly and venous collaterals. The main portal vein appears patent. 3. Progressive moderate volume ascites. Ascites and a small bowel loop extend into an umbilical hernia. No evidence of bowel obstruction or incarceration. 4. Chronic bilateral femoral head avascular necrosis. Electronically Signed   By: Carey Bullocks M.D.   On: 01/21/2019 19:37   Dg Chest Portable 1 View  Result Date: 01/21/2019 CLINICAL DATA:  38 year old with current history of hepatic cirrhosis and, by the patient's report, recent diagnosis of pneumonia at an outside facility. EXAM: PORTABLE CHEST 1 VIEW COMPARISON:  02/21/2018 and earlier, including CTA chest 05/13/2015. FINDINGS: Cardiac silhouette moderately to markedly enlarged, unchanged since 2019 but increased in size since 2016. Pulmonary venous hypertension with perhaps minimal interstitial pulmonary edema. Large RIGHT pleural effusion and associated consolidation in the RIGHT LOWER LOBE. Lungs otherwise clear. No visible LEFT pleural effusion. IMPRESSION: 1. Large RIGHT pleural effusion and associated passive atelectasis and/or pneumonia involving the RIGHT LOWER LOBE. 2. Stable moderate to marked cardiomegaly. Pulmonary venous hypertension with perhaps minimal interstitial pulmonary edema indicating CHF and/or fluid overload. Electronically Signed   By: Hulan Saas M.D.   On: 01/21/2019 16:25        Scheduled Meds: . folic acid  1 mg Oral Daily  . magnesium oxide  400 mg Oral Daily  . mouth rinse  15 mL Mouth Rinse BID  . multivitamin with minerals  1 tablet Oral Daily  . pantoprazole  40 mg Oral  Daily  . potassium chloride  40 mEq Oral BID  . thiamine  100 mg Oral Daily   Continuous Infusions: . ceFEPime (MAXIPIME) IV    . vancomycin 1,000 mg (01/22/19 0507)     LOS: 1 day        Glade Lloyd, MD [facility] 01/22/2019, 9:22 AM

## 2019-01-23 ENCOUNTER — Inpatient Hospital Stay (HOSPITAL_COMMUNITY): Payer: Medicaid - Out of State

## 2019-01-23 ENCOUNTER — Encounter (HOSPITAL_COMMUNITY): Payer: Self-pay | Admitting: Student

## 2019-01-23 DIAGNOSIS — N179 Acute kidney failure, unspecified: Secondary | ICD-10-CM

## 2019-01-23 DIAGNOSIS — D72829 Elevated white blood cell count, unspecified: Secondary | ICD-10-CM

## 2019-01-23 HISTORY — PX: IR PARACENTESIS: IMG2679

## 2019-01-23 LAB — PROTEIN / CREATININE RATIO, URINE
Creatinine, Urine: 205.55 mg/dL
Protein Creatinine Ratio: 0.17 mg/mg{Cre} — ABNORMAL HIGH (ref 0.00–0.15)
Total Protein, Urine: 35 mg/dL

## 2019-01-23 LAB — COMPREHENSIVE METABOLIC PANEL
ALT: 40 U/L (ref 0–44)
AST: 124 U/L — ABNORMAL HIGH (ref 15–41)
Albumin: 1.7 g/dL — ABNORMAL LOW (ref 3.5–5.0)
Alkaline Phosphatase: 162 U/L — ABNORMAL HIGH (ref 38–126)
Anion gap: 10 (ref 5–15)
BUN: 9 mg/dL (ref 6–20)
CO2: 21 mmol/L — ABNORMAL LOW (ref 22–32)
Calcium: 8 mg/dL — ABNORMAL LOW (ref 8.9–10.3)
Chloride: 93 mmol/L — ABNORMAL LOW (ref 98–111)
Creatinine, Ser: 1.76 mg/dL — ABNORMAL HIGH (ref 0.61–1.24)
GFR calc Af Amer: 56 mL/min — ABNORMAL LOW (ref >60)
GFR calc non Af Amer: 48 mL/min — ABNORMAL LOW (ref >60)
Glucose, Bld: 110 mg/dL — ABNORMAL HIGH (ref 70–99)
Potassium: 4.2 mmol/L (ref 3.5–5.1)
Sodium: 124 mmol/L — ABNORMAL LOW (ref 135–145)
Total Bilirubin: 4.7 mg/dL — ABNORMAL HIGH (ref 0.3–1.2)
Total Protein: 7.5 g/dL (ref 6.5–8.1)

## 2019-01-23 LAB — LACTATE DEHYDROGENASE, PLEURAL OR PERITONEAL FLUID: LD, Fluid: 25 U/L — ABNORMAL HIGH (ref 3–23)

## 2019-01-23 LAB — PROTEIN, PLEURAL OR PERITONEAL FLUID: Total protein, fluid: <3 g/dL

## 2019-01-23 LAB — CBC WITH DIFFERENTIAL/PLATELET
Abs Immature Granulocytes: 1.09 10*3/uL — ABNORMAL HIGH (ref 0.00–0.07)
Basophils Absolute: 0.1 10*3/uL (ref 0.0–0.1)
Basophils Relative: 0 %
Eosinophils Absolute: 0.1 10*3/uL (ref 0.0–0.5)
Eosinophils Relative: 0 %
HCT: 28.6 % — ABNORMAL LOW (ref 39.0–52.0)
Hemoglobin: 10.1 g/dL — ABNORMAL LOW (ref 13.0–17.0)
Immature Granulocytes: 4 %
Lymphocytes Relative: 4 %
Lymphs Abs: 1.3 10*3/uL (ref 0.7–4.0)
MCH: 34.6 pg — ABNORMAL HIGH (ref 26.0–34.0)
MCHC: 35.3 g/dL (ref 30.0–36.0)
MCV: 97.9 fL (ref 80.0–100.0)
Monocytes Absolute: 1.6 10*3/uL — ABNORMAL HIGH (ref 0.1–1.0)
Monocytes Relative: 5 %
Neutro Abs: 25.7 10*3/uL — ABNORMAL HIGH (ref 1.7–7.7)
Neutrophils Relative %: 87 %
Platelets: 125 10*3/uL — ABNORMAL LOW (ref 150–400)
RBC: 2.92 MIL/uL — ABNORMAL LOW (ref 4.22–5.81)
RDW: 14.7 % (ref 11.5–15.5)
WBC: 29.9 10*3/uL — ABNORMAL HIGH (ref 4.0–10.5)
nRBC: 0 % (ref 0.0–0.2)

## 2019-01-23 LAB — BODY FLUID CELL COUNT WITH DIFFERENTIAL
Eos, Fluid: 0 %
Lymphs, Fluid: 3 %
Monocyte-Macrophage-Serous Fluid: 29 % — ABNORMAL LOW (ref 50–90)
Neutrophil Count, Fluid: 68 % — ABNORMAL HIGH (ref 0–25)
Total Nucleated Cell Count, Fluid: 380 cu mm (ref 0–1000)

## 2019-01-23 LAB — GRAM STAIN

## 2019-01-23 LAB — URINE CULTURE: Culture: NO GROWTH

## 2019-01-23 LAB — ALBUMIN, PLEURAL OR PERITONEAL FLUID: Albumin, Fluid: <1 g/dL

## 2019-01-23 LAB — GLUCOSE, PLEURAL OR PERITONEAL FLUID: Glucose, Fluid: 121 mg/dL

## 2019-01-23 LAB — SODIUM, URINE, RANDOM: Sodium, Ur: 14 mmol/L

## 2019-01-23 LAB — OSMOLALITY: Osmolality: 270 mOsm/kg — ABNORMAL LOW (ref 275–295)

## 2019-01-23 LAB — OSMOLALITY, URINE: Osmolality, Ur: 300 mOsm/kg (ref 300–900)

## 2019-01-23 LAB — PROTIME-INR
INR: 3.3 — ABNORMAL HIGH (ref 0.8–1.2)
Prothrombin Time: 33.1 seconds — ABNORMAL HIGH (ref 11.4–15.2)

## 2019-01-23 LAB — MAGNESIUM: Magnesium: 1.1 mg/dL — ABNORMAL LOW (ref 1.7–2.4)

## 2019-01-23 MED ORDER — FUROSEMIDE 10 MG/ML IJ SOLN
40.0000 mg | Freq: Three times a day (TID) | INTRAMUSCULAR | Status: DC
  Administered 2019-01-23 – 2019-01-29 (×22): 40 mg via INTRAVENOUS
  Filled 2019-01-23 (×21): qty 4

## 2019-01-23 MED ORDER — MAGNESIUM SULFATE 4 GM/100ML IV SOLN
4.0000 g | Freq: Once | INTRAVENOUS | Status: AC
  Administered 2019-01-23: 4 g via INTRAVENOUS
  Filled 2019-01-23: qty 100

## 2019-01-23 MED ORDER — LIDOCAINE HCL 1 % IJ SOLN
INTRAMUSCULAR | Status: AC | PRN
  Administered 2019-01-23: 10 mL

## 2019-01-23 MED ORDER — GUAIFENESIN-DM 100-10 MG/5ML PO SYRP
5.0000 mL | ORAL_SOLUTION | ORAL | Status: DC | PRN
  Administered 2019-01-29: 5 mL via ORAL
  Filled 2019-01-23 (×3): qty 5

## 2019-01-23 MED ORDER — VITAMIN K1 10 MG/ML IJ SOLN
5.0000 mg | Freq: Once | INTRAVENOUS | Status: AC
  Administered 2019-01-23: 5 mg via INTRAVENOUS
  Filled 2019-01-23: qty 0.5

## 2019-01-23 MED ORDER — BENZONATATE 100 MG PO CAPS
200.0000 mg | ORAL_CAPSULE | Freq: Three times a day (TID) | ORAL | Status: DC | PRN
  Administered 2019-01-23 – 2019-01-30 (×4): 200 mg via ORAL
  Filled 2019-01-23 (×4): qty 2

## 2019-01-23 MED ORDER — LIDOCAINE HCL 1 % IJ SOLN
INTRAMUSCULAR | Status: AC
  Administered 2019-01-23: 19:00:00
  Filled 2019-01-23: qty 20

## 2019-01-23 MED ORDER — ALBUMIN HUMAN 25 % IV SOLN
25.0000 g | Freq: Four times a day (QID) | INTRAVENOUS | Status: AC
  Administered 2019-01-23 – 2019-01-24 (×4): 25 g via INTRAVENOUS
  Filled 2019-01-23: qty 50
  Filled 2019-01-23 (×2): qty 100
  Filled 2019-01-23 (×2): qty 50

## 2019-01-23 NOTE — Procedures (Signed)
PROCEDURE SUMMARY:  Successful image-guided paracentesis from the right lower abdomen.  Yielded 2.2 liters of clear yellow fluid.  No immediate complications.  EBL = 0 mL. Patient tolerated well.   Specimen was sent for labs.  Gordy Councilman Rosabell Geyer PA-C 01/23/2019 3:02 PM

## 2019-01-23 NOTE — Progress Notes (Signed)
   01/23/19 1010  Clinical Encounter Type  Visited With Patient  Visit Type Initial;Other (Comment) (AD)  Referral From Nurse  Consult/Referral To Chaplain  This chaplain was pastorally present to answer the Pt. questions about an AD.  The chaplain informed the Pt. of [facility] [employer] after confirming the Pt. interest in an AD. The Pt. stated his father would be his medical decision maker if he could not speak for himself.  The Pt. is interested in pursuing a Living Will with clear points of reference influencing his end of life decisions.  This chaplain offered to return to answer further questions, because the Pt. was becoming drowsy.  The chaplain informed the Pt. a contact with the spiritual care office can be made through the RN.  The chaplain updated the Pt. RN-Morgan before leaving the unit.

## 2019-01-23 NOTE — Progress Notes (Signed)
I&Os inaccurate due to patient accidentally urinating on the floor. Have stressed the importance of using the urinal for maximum efficiency in tracking output, will continue to do so.  Lorenza Cambridge, RN 01/23/19 3:17 PM

## 2019-01-23 NOTE — Progress Notes (Signed)
  Subjective: Fever overnight. Still with cough- did bring up some sputum last night Feels a little better this morning  Objective: Vital signs in last 24 hours: Temp:  [98.1 F (36.7 C)-101.8 F (38.8 C)] 98.1 F (36.7 C) (04/27 0730) Pulse Rate:  [86-116] 89 (04/27 0730) Cardiac Rhythm: Normal sinus rhythm (04/27 0700) Resp:  [18-19] 19 (04/26 1801) BP: (98-110)/(40-84) 104/48 (04/27 0730) SpO2:  [88 %-94 %] 94 % (04/27 0730)  Hemodynamic parameters for last 24 hours:    Intake/Output from previous day: 04/26 0701 - 04/27 0700 In: 946.2 [P.O.:750; IV Piggyback:196.2] Out: 1100 [Urine:1100] Intake/Output this shift: Total I/O In: 360 [P.O.:360] Out: 50 [Urine:50]  General appearance: alert, cooperative and no distress Neurologic: intact Lungs: diminished breath sounds right Abdomen: abnormal findings:  ascites  Lab Results: Recent Labs    01/22/19 0049 01/23/19 0627  WBC 15.6* 29.9*  HGB 10.8* 10.1*  HCT 30.3* 28.6*  PLT 145* 125*   BMET:  Recent Labs    01/22/19 0049 01/23/19 0627  NA 122* 124*  K 3.4* 4.2  CL 89* 93*  CO2 20* 21*  GLUCOSE 104* 110*  BUN 5* 9  CREATININE 0.64 1.76*  CALCIUM 8.0* 8.0*    PT/INR:  Recent Labs    01/23/19 0627  LABPROT 33.1*  INR 3.3*   ABG No results found for: PHART, HCO3, TCO2, ACIDBASEDEF, O2SAT CBG (last 3)  No results for input(s): GLUCAP in the last 72 hours.  Assessment/Plan:  38 yo man with right lower lobe pneumonia with loculated pleural effusion. Also hepatic failure with ascites and coagulopathy.  On empiric Vanco and Maxipime  Fever overnight with significant increase in WBC and new onset acute renal failure.   He needs an image guided percutaneous drain placed. Hopefully can be done relatively soon. Coagulopathy remains an issue with INR improved but still 3.3   LOS: 2 days    Loreli Slot 01/23/2019

## 2019-01-23 NOTE — TOC Initial Note (Addendum)
Transition of Care [facility]) - Initial/Assessment Note    Patient Details  Name: Grant [name] MRN: [id number] Date of Birth: 09-30-1980  Transition of Care [facility]) CM/SW Contact:    Lawerance Sabal, RN Phone Number: 01/23/2019, 9:53 AM  Clinical Narrative:              Spoke w patient at bedside. He states that he lives at home with his dad. He does not work. He drives, and his dad drives. He does not have any DME at home currently.   He states his PCP is Jolee Ewing at [facility]. Added to AVS.  He has Medicaid in [place].       Expected Discharge Plan: Home/Self Care Barriers to Discharge: Continued Medical Work up   Patient Goals and CMS Choice Patient states their goals for this hospitalization and ongoing recovery are:: to return home      Expected Discharge Plan and Services Expected Discharge Plan: Home/Self Care   Discharge Planning Services: CM Consult                                          Prior Living Arrangements/Services   Lives with:: Parents(dad) Patient language and need for interpreter reviewed:: Yes Do you feel safe going back to the place where you live?: Yes            Criminal Activity/Legal Involvement Pertinent to Current Situation/Hospitalization: No - Comment as needed  Activities of Daily Living Home Assistive Devices/Equipment: None ADL Screening (condition at time of admission) Patient's cognitive ability adequate to safely complete daily activities?: Yes Is the patient deaf or have difficulty hearing?: No Does the patient have difficulty seeing, even when wearing glasses/contacts?: No Does the patient have difficulty concentrating, remembering, or making decisions?: No Patient able to express need for assistance with ADLs?: Yes Does the patient have difficulty dressing or bathing?: No Independently performs ADLs?: Yes (appropriate for developmental age) Does the patient have difficulty walking or climbing stairs?:  Yes Weakness of Legs: Both Weakness of Arms/Hands: None  Permission Sought/Granted                  Emotional Assessment              Admission diagnosis:  Hypoxia [R09.02] Pleural effusion on right [J90] Alcoholic cirrhosis of liver with ascites (HCC) [K70.31] HCAP (healthcare-associated pneumonia) [J18.9] Patient Active Problem List   Diagnosis Date Noted  . Acute respiratory failure with hypoxia (HCC) 01/21/2019  . RLL pneumonia (HCC) 01/21/2019  . Empyema of right pleural space (HCC) 01/21/2019  . Hyponatremia 01/21/2019  . Supratherapeutic INR 01/21/2019  . Lactic acidosis 01/21/2019  . Portal vein thrombosis 08/02/2018  . Abdominal distension   . Community acquired pneumonia   . LLL pneumonia (HCC) 01/31/2018  . Symptomatic anemia 01/31/2018  . Hypomagnesemia 01/31/2018  . Hypokalemia 01/31/2018  . Esophageal reflux   . Morbid obesity due to excess calories (HCC)   . Dehydration 05/09/2016  . Alcoholic cirrhosis of liver with ascites (HCC) 05/09/2016  . Diarrhea 05/09/2016  . Campylobacter diarrhea 05/09/2016  . Alcohol abuse 05/09/2016  . Hematochezia   . Thrombocytopenia (HCC) 09/29/2011  . Splenomegaly 09/29/2011  . Cirrhosis (HCC) 09/29/2011  . Ascites 09/29/2011   PCP:  Patient, No Pcp Per Pharmacy:   [facility] [place] - [address]  [place] [id number] Phone: [phone] Fax: [phone]     Social Determinants of Health (SDOH) Interventions    Readmission Risk Interventions No flowsheet data found.

## 2019-01-23 NOTE — Progress Notes (Addendum)
Patient ID: Grant [name], male   DOB: [birth year], 38 y.o.   MRN: [id number]  PROGRESS NOTE    Grant [name]  VOU:[id number] DOB: [birth year] DOA: 01/21/2019 PCP: Patient, No Pcp Per   Brief Narrative:  38 year old male with history of alcoholic cirrhosis, portal vein thrombosis diagnosed in 06/2018 for which he is currently on Xarelto, severe microcytic anemia, GERD, splenomegaly presented with shortness of breath and cough worsening over the past 3 weeks, with COVID-19 testing done as an outpatient 3 weeks ago apparently negative.  On presentation he was found to have right lower lobe pneumonia with loculated pleural fluid, likely empyema.  He was also found to have elevated INR along with hyponatremia.  CT surgery was consulted who recommended transfer to [facility].  Assessment & Plan:   Principal Problem:   Acute respiratory failure with hypoxia (HCC) Active Problems:   Alcoholic cirrhosis of liver with ascites (HCC)   Esophageal reflux   Morbid obesity due to excess calories (HCC)   Hypokalemia   Ascites   RLL pneumonia (HCC)   Empyema of right pleural space (HCC)   Hyponatremia   Supratherapeutic INR   Lactic acidosis   Right lower lobar bacterial pneumonia with loculated pleural effusion/likely empyema with hypoxia -Patient has been empirically started on vancomycin and cefepime.  Cultures have been negative so far.  DC vancomycin.  Still having temperature spikes.  White count worsening.  Currently requiring 2 L oxygen via nasal cannula. -CT surgery evaluation appreciated: Patient is a poor surgical candidate and CT surgery recommends IR consultation for empyema drainage. -Strep pneumo urinary antigen negative -COVID-19 testing negative  Decompensated alcoholic cirrhosis of liver with ascites with elevated LFTs -CT showing moderate ascites.  No evidence of SBP -Patient follows up with gastroenterology as an outpatient.  No recent alcohol use. -Takes Lasix and  spironolactone at home.   - started on Lasix 40 mg IV every 12 hours on 01/22/2019.  Creatinine has gotten worse.  Still pretty volume overloaded with anasarca.  Will increase Lasix to 40 mg IV every 8 hours. will get nephrology evaluation. -We will request IR for paracentesis as well.  Hyponatremia -Most likely hypervolemic.  Sodium 124 today.  Fluid restriction.  Intravenous Lasix plan as above.  Monitor sodium  Supratherapeutic INR -Patient takes Xarelto at home.  Xarelto on hold. -INR 3.3 today.  No signs of bleeding.  Received 1 dose of vitamin K on 01/22/2019.  Will give 1 more dose of vitamin K 5 mg IV today.  Monitor INR  Leukocytosis -From pneumonia and empyema.  Has worsened.  Monitor  Acute kidney injury -From combination of diuretic use, questionable hepatorenal syndrome.  Will get renal ultrasound.  Patient states that his urine output is decreasing.  Strict input and output.  Lasix as above.  Will get nephrology evaluation.  Hypoalbuminemia -From decompensated liver cirrhosis -We will give few doses of IV albumin today along with Lasix.  Hypokalemia -Improved   Hypomagnesemia -Replace.  Repeat a.m. labs  GERD -Continue PPI  Chronic anemia -Hemoglobin stable.  Monitor.  Thrombocytopenia -Most likely from cirrhosis of liver.  No signs of bleeding.  Monitor  History of portal vein thrombosis -Hold Xarelto   DVT prophylaxis: SCDs Code Status: Full Family Communication: None at bedside Disposition Plan: Depends on clinical outcome  Consultants: CT surgery  Procedures: None  Antimicrobials: Vancomycin and cefepime from 01/21/2019 onwards   Subjective: Patient seen and examined at bedside.  Patient has a temperature spikes overnight.  Does  not feel well.  Has abdominal discomfort.  No worsening shortness of breath.  Has intermittent cough.  Objective: Vitals:   01/22/19 2044 01/22/19 2200 01/23/19 0200 01/23/19 0730  BP: 101/66 (!) 98/40  (!) 104/48    Pulse: (!) 116 (!) 107  89  Resp:      Temp: (!) 101.8 F (38.8 C) (!) 100.7 F (38.2 C) 99.6 F (37.6 C) 98.1 F (36.7 C)  TempSrc:   Axillary Oral  SpO2: (!) 88%   94%  Weight:      Height:        Intake/Output Summary (Last 24 hours) at 01/23/2019 0843 Last data filed at 01/22/2019 1800 Gross per 24 hour  Intake 946.18 ml  Output 1000 ml  Net -53.82 ml   Filed Weights   01/21/19 1511  Weight: 99.8 kg    Examination:  General exam: Appears calm and comfortable.  Looks chronically ill.  Older than stated age.  No acute distress Respiratory system: Bilateral decreased breath sounds at bases with scattered crackles.  No wheezing.  Intermittent tachypnea Cardiovascular system: S1 & S2 heard, intermittently tachycardic  gastrointestinal system: Abdomen is obese, distended, soft and nontender. Normal bowel sounds heard. Extremities: No cyanosis; 2-3+ pitting pedal edema  Central nervous system: Alert and oriented. No focal neurological deficits. Moving extremities Skin: Several bruises.  No other rash or ulceration. Psychiatry: Flat affect.    Data Reviewed: I have personally reviewed following labs and imaging studies  CBC: Recent Labs  Lab 01/21/19 1536 01/22/19 0049 01/23/19 0627  WBC 17.0* 15.6* 29.9*  NEUTROABS 13.6*  --  25.7*  HGB 11.8* 10.8* 10.1*  HCT 33.2* 30.3* 28.6*  MCV 96.0 94.7 97.9  PLT 171 145* 125*   Basic Metabolic Panel: Recent Labs  Lab 01/21/19 1536 01/22/19 0049 01/23/19 0627  NA 121* 122* 124*  K 3.4* 3.4* 4.2  CL 88* 89* 93*  CO2 22 20* 21*  GLUCOSE 117* 104* 110*  BUN 7 5* 9  CREATININE 0.60* 0.64 1.76*  CALCIUM 7.9* 8.0* 8.0*  MG  --   --  1.1*   GFR: Estimated Creatinine Clearance: 68 mL/min (A) (by C-G formula based on SCr of 1.76 mg/dL (H)). Liver Function Tests: Recent Labs  Lab 01/21/19 1536 01/22/19 0049 01/23/19 0627  AST 127* 129* 124*  ALT 41 40 40  ALKPHOS 170* 154* 162*  BILITOT 2.9* 3.1* 4.7*  PROT  8.5* 7.9 7.5  ALBUMIN 2.2* 1.7* 1.7*   Recent Labs  Lab 01/21/19 1648  LIPASE 37   No results for input(s): AMMONIA in the last 168 hours. Coagulation Profile: Recent Labs  Lab 01/21/19 1648 01/22/19 0049 01/23/19 0627  INR 6.1* 5.2* 3.3*   Cardiac Enzymes: No results for input(s): CKTOTAL, CKMB, CKMBINDEX, TROPONINI in the last 168 hours. BNP (last 3 results) No results for input(s): PROBNP in the last 8760 hours. HbA1C: No results for input(s): HGBA1C in the last 72 hours. CBG: No results for input(s): GLUCAP in the last 168 hours. Lipid Profile: No results for input(s): CHOL, HDL, LDLCALC, TRIG, CHOLHDL, LDLDIRECT in the last 72 hours. Thyroid Function Tests: No results for input(s): TSH, T4TOTAL, FREET4, T3FREE, THYROIDAB in the last 72 hours. Anemia Panel: No results for input(s): VITAMINB12, FOLATE, FERRITIN, TIBC, IRON, RETICCTPCT in the last 72 hours. Sepsis Labs: Recent Labs  Lab 01/21/19 1648 01/22/19 0049  LATICACIDVEN 2.6* 2.9*    Recent Results (from the past 240 hour(s))  SARS Coronavirus 2 [facility](Hospital order, Performed in  [facility] hospital lab)     Status: None   Collection Time: 01/21/19  3:58 PM  Result Value Ref Range Status   SARS Coronavirus 2 NEGATIVE NEGATIVE Final    Comment: (NOTE) If result is NEGATIVE SARS-CoV-2 target nucleic acids are NOT DETECTED. The SARS-CoV-2 RNA is generally detectable in upper and lower  respiratory specimens during the acute phase of infection. The lowest  concentration of SARS-CoV-2 viral copies this assay can detect is 250  copies / mL. A negative result does not preclude SARS-CoV-2 infection  and should not be used as the sole basis for treatment or other  patient management decisions.  A negative result may occur with  improper specimen collection / handling, submission of specimen other  than nasopharyngeal swab, presence of viral mutation(s) within the  areas targeted by this assay, and inadequate number  of viral copies  (<250 copies / mL). A negative result must be combined with clinical  observations, patient history, and epidemiological information. If result is POSITIVE SARS-CoV-2 target nucleic acids are DETECTED. The SARS-CoV-2 RNA is generally detectable in upper and lower  respiratory specimens dur ing the acute phase of infection.  Positive  results are indicative of active infection with SARS-CoV-2.  Clinical  correlation with patient history and other diagnostic information is  necessary to determine patient infection status.  Positive results do  not rule out bacterial infection or co-infection with other viruses. If result is PRESUMPTIVE POSTIVE SARS-CoV-2 nucleic acids MAY BE PRESENT.   A presumptive positive result was obtained on the submitted specimen  and confirmed on repeat testing.  While 2019 novel coronavirus  (SARS-CoV-2) nucleic acids may be present in the submitted sample  additional confirmatory testing may be necessary for epidemiological  and / or clinical management purposes  to differentiate between  SARS-CoV-2 and other Sarbecovirus currently known to infect humans.  If clinically indicated additional testing with an alternate test  methodology [phone]) is advised. The SARS-CoV-2 RNA is generally  detectable in upper and lower respiratory sp ecimens during the acute  phase of infection. The expected result is Negative. Fact Sheet for Patients:  BoilerBrush.com.cy Fact Sheet for Healthcare Providers: https://pope.com/ This test is not yet approved or cleared by the [place] FDA and has been authorized for detection and/or diagnosis of SARS-CoV-2 by FDA under an Emergency Use Authorization (EUA).  This EUA will remain in effect (meaning this test can be used) for the duration of the COVID-19 declaration under Section 564(b)(1) of the Act, 21 U.S.C. section 360bbb-3(b)(1), unless the authorization is  terminated or revoked sooner. Performed at [facility], [address], [place], [place] [address]   Culture, blood (Routine X 2) w Reflex to ID Panel     Status: None (Preliminary result)   Collection Time: 01/21/19  4:53 PM  Result Value Ref Range Status   Specimen Description   Final    LEFT ANTECUBITAL BOTTLES DRAWN AEROBIC AND ANAEROBIC   Special Requests Blood Culture adequate volume  Final   Culture   Final    NO GROWTH < 24 HOURS Performed at [facility], [address] [place], [place] [address]    Report Status PENDING  Incomplete  Culture, blood (Routine X 2) w Reflex to ID Panel     Status: None (Preliminary result)   Collection Time: 01/21/19  4:54 PM  Result Value Ref Range Status   Specimen Description BLOOD LEFT ARM BOTTLES DRAWN AEROBIC ONLY  Final   Special Requests Blood  Culture adequate volume  Final   Culture   Final    NO GROWTH < 24 HOURS Performed at [facility], [address], [place], [place] [address]    Report Status PENDING  Incomplete  Urine Culture     Status: None   Collection Time: 01/21/19  5:31 PM  Result Value Ref Range Status   Specimen Description   Final    URINE, CLEAN CATCH Performed at [facility], [address], [place], [place] [address]    Special Requests   Final    NONE Performed at [facility], [address], [place], [place] [address]    Culture   Final    NO GROWTH Performed at [facility], [address], [place], [place] [address]    Report Status 01/23/2019 FINAL  Final  MRSA PCR Screening     Status: None   Collection Time: 01/22/19  1:00 AM  Result Value Ref Range Status   MRSA by PCR NEGATIVE NEGATIVE Final    Comment:        The GeneXpert MRSA Assay (FDA approved for NASAL specimens only), is one component of a comprehensive MRSA colonization surveillance program. It is not intended to diagnose MRSA infection nor to guide or monitor treatment for MRSA infections. Performed at [facility], [address] [place], [place] [address]   Culture, sputum-assessment     Status: None   Collection Time: 01/22/19  5:02 AM  Result Value Ref Range Status   Specimen Description SPUTUM  Final   Special Requests Normal  Final   Sputum evaluation   Final    THIS SPECIMEN IS ACCEPTABLE FOR SPUTUM CULTURE Performed at [facility], [address], [place], [place] [address]    Report Status 01/22/2019 FINAL  Final  Culture, respiratory     Status: None (Preliminary result)   Collection Time: 01/22/19  5:02 AM  Result Value Ref Range Status   Specimen Description SPUTUM  Final   Special Requests Normal Reflexed from [id number]  Final   Gram Stain   Final    MODERATE WBC PRESENT, PREDOMINANTLY PMN FEW GRAM NEGATIVE RODS RARE GRAM POSITIVE COCCI IN PAIRS Performed at [facility], [address] [place], [place] [address]    Culture PENDING  Incomplete   Report Status PENDING  Incomplete         Radiology Studies: Ct Chest Wo Contrast  Result Date: 01/21/2019 CLINICAL DATA:  Right lower lobe opacity and complex pleural fluid seen on prior abdominal CT. EXAM: CT CHEST WITHOUT CONTRAST TECHNIQUE: Multidetector CT imaging of the chest was performed following the standard protocol without IV contrast. COMPARISON:  CT abdomen 01/21/2019 FINDINGS: Cardiovascular: Heart is normal size. Aorta is normal caliber. Coronary artery calcification in the left anterior descending coronary artery. Mediastinum/Nodes: No mediastinal, hilar, or axillary adenopathy. Lungs/Pleura: Dense consolidation noted in the right lower lobe compatible with pneumonia. Loculated pleural fluid noted along the right heart border and superior laterally in the right hemithorax. There is a rounded fluid collection posteriorly in the right lower hemithorax measuring 7.2 cm concerning for empyema or pulmonary abscess. On coronal imaging, this extends over a craniocaudal length of 10 cm. Dependent atelectasis in the left  lower lobe. Upper Abdomen: Changes of cirrhosis with ascites. Musculoskeletal: No acute bony abnormality. Chest wall soft tissues are unremarkable. IMPRESSION: Dense consolidation in the right lower lobe compatible with pneumonia. Loculated right pleural fluid. Rounded fluid collection posteriorly  in the right lower hemithorax which could be loculated fluid and reflect empyema or pulmonary abscess. Coronary artery disease. Cirrhosis, ascites. Electronically Signed   By: Charlett Nose M.D.   On: 01/21/2019 20:28   Ct Abdomen Pelvis W Contrast  Result Date: 01/21/2019 CLINICAL DATA:  Coughing since January. History of thrombocytopenia, splenomegaly and cirrhosis. Recently diagnosed with pneumonia. EXAM: CT ABDOMEN AND PELVIS WITH CONTRAST TECHNIQUE: Multidetector CT imaging of the abdomen and pelvis was performed using the standard protocol following bolus administration of intravenous contrast. CONTRAST:  OMNIPAQUE IOHEXOL 300 MG/ML  SOLN COMPARISON:  Chest radiographs today. Abdominopelvic CT 05/08/2016. Right upper quadrant ultrasound 06/30/2018. FINDINGS: Lower chest: The lung bases are incompletely visualized. There is consolidation of the visualized right lower lobe with a large loculated right pleural effusion. This has a component posteriorly with thick peripheral enhancement, measuring up to 7.9 x 5.2 cm on image 9/2. Based on the provided history, this is suspicious for empyema and possible pleural-based abscess. There is minimal atelectasis at the left lung base and no significant left pleural effusion. The azygos and hemi azygous veins are mildly dilated. Hepatobiliary: There are progressive changes of cirrhosis throughout the liver with diffuse contour irregularity and parenchymal heterogeneity. No dominant mass or focally abnormal enhancement identified. The portal vein is patent. No evidence of gallstones, gallbladder wall thickening or biliary dilatation. Pancreas: Unremarkable. No pancreatic  ductal dilatation or surrounding inflammatory changes. Spleen: Measures approximately 14.7 x 7.5 x 12.3 cm (volume = 710 cm^3) consistent with mild splenomegaly. No focal abnormality. Adrenals/Urinary Tract: Both adrenal glands appear normal. The kidneys, ureters and bladder appear normal. Stomach/Bowel: No evidence of bowel wall thickening, distention or surrounding inflammatory change. There is a large amount of stool within the rectum. There is a probable loop of small bowel extending into an umbilical hernia. No evidence of bowel obstruction or incarceration. Vascular/Lymphatic: There are no enlarged abdominal or pelvic lymph nodes. Minimal aortic atherosclerosis. No acute vascular findings are identified. 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 The portal, superior mesenteric and splenic veins appear patent. Reproductive: The prostate gland and seminal vesicles appear normal. Other: Interval development of a moderate amount of ascites. Fluid extends into an umbilical hernia which measures up to 8.6 cm transverse on image 73/2. As above, there is air within this hernia as well which appears to be due to a small knuckle of small bowel. Musculoskeletal: No acute osseous findings. There are old rib fractures on the left. There are changes of bilateral femoral head avascular necrosis with early subchondral collapse on the right. There is edema throughout the subcutaneous fat. In the upper right buttocks, there is a globular area of increased signal which could be related to prior trauma/injection or reflect fat necrosis. IMPRESSION: 1. Right lower lobe pulmonary consolidation with complex fluid in the right pleural space, worrisome for empyema and pleural-based abscess. Full chest CT may be helpful for further evaluation. 2. Significantly progressive changes of diffuse hepatic cirrhosis compared with previous CT from 2017. Mild changes of portal hypertension with splenomegaly and  venous collaterals. The main portal vein appears patent. 3. Progressive moderate volume ascites. Ascites and a small bowel loop extend into an umbilical hernia. No evidence of bowel obstruction or incarceration. 4. Chronic bilateral femoral head avascular necrosis. Electronically Signed   By: Carey Bullocks M.D.   On: 01/21/2019 19:37   Dg Chest Portable 1 View  Result Date: 01/21/2019 CLINICAL DATA:  38 year old with current history of hepatic  cirrhosis and, by the patient's report, recent diagnosis of pneumonia at an outside facility. EXAM: PORTABLE CHEST 1 VIEW COMPARISON:  02/21/2018 and earlier, including CTA chest 05/13/2015. FINDINGS: Cardiac silhouette moderately to markedly enlarged, unchanged since 2019 but increased in size since 2016. Pulmonary venous hypertension with perhaps minimal interstitial pulmonary edema. Large RIGHT pleural effusion and associated consolidation in the RIGHT LOWER LOBE. Lungs otherwise clear. No visible LEFT pleural effusion. IMPRESSION: 1. Large RIGHT pleural effusion and associated passive atelectasis and/or pneumonia involving the RIGHT LOWER LOBE. 2. Stable moderate to marked cardiomegaly. Pulmonary venous hypertension with perhaps minimal interstitial pulmonary edema indicating CHF and/or fluid overload. Electronically Signed   By: Hulan Saas M.D.   On: 01/21/2019 16:25        Scheduled Meds:  folic acid  1 mg Oral Daily   furosemide  40 mg Intravenous BID   magnesium oxide  400 mg Oral Daily   mouth rinse  15 mL Mouth Rinse BID   multivitamin with minerals  1 tablet Oral Daily   pantoprazole  40 mg Oral Daily   potassium chloride  40 mEq Oral BID   thiamine  100 mg Oral Daily   Continuous Infusions:  ceFEPime (MAXIPIME) IV 2 g (01/23/19 0127)   vancomycin 1,000 mg (01/23/19 0415)     LOS: 2 days        Glade Lloyd, MD [facility] 01/23/2019, 8:43 AM

## 2019-01-23 NOTE — Consult Note (Signed)
Darnelle BosBryan Osterhout Admit Date: 01/21/2019 01/23/2019 Arita Missyan B [place] Requesting Physician:  Hanley BenAlekh MD  Reason for Consult:  AKI, Hyponatremia, diuretic resistance HPI:  38 year old male admitted on 4/25 with several weeks of progressive dyspnea, productive cough.  Work-up identified right lower lobe pneumonia with loculated pleural effusion concerning for empyema; negative COVID testing; supratherapeutic INR and on Xarelto; hyperkalemia.  PMH Incudes:  Alcoholic cirrhosis with history of ascites, splenomegaly, thrombocytopenia, portal vein thrombosis  GERD  Part of patient's initial work-up included contrasted CT of the abdomen and pelvis as mentioned above.  Presenting serum creatinine was 0.6 which has worsened to 1.76 today with a potassium of 4.2 and bicarbonate of 21.  Renal ultrasound since admission with no acute obstructive or structural renal issues; ascites and splenomegaly noted.  Urine analysis without protein.  Hematuria was present and greater than 50 erythrocytes per high-power field.  Further, patient found to have hyponatremia, current serum sodium of 124, leukocytosis with WBC of 29.9 today, hypoalbuminemia with albumin of 1.7, abnormal LFTs with increased AST, total bilirubin of 4.7.  Plan for percutaneous pleural catheter to drain empyema, cardiothoracic surgery is following, unfortunately not yet possible given patient's elevated INR.  Patient denies use of nonsteroidals at home.  He does not take ACE inhibitors or ARB's.   Creatinine, Ser (mg/dL)  Date Value  98/11/914704/27/2020 1.76 (H)  01/22/2019 0.64  01/21/2019 0.60 (L)  10/28/2018 0.73  06/29/2018 0.83  06/27/2018 0.56 (L)  06/23/2018 0.53 (L)  03/09/2018 0.44 (L)  02/04/2018 0.41 (L)  02/03/2018 0.50 (L)  ] I/Os: I/O last 3 completed shifts: In: 1887.2 [P.O.:950; IV Piggyback:937.2] Out: 1500 [Urine:1500]   ROS NSAIDS: no exposure IV Contrast s/p CT with IV contrast 4/25 TMP/SMX non exopsure Hypotension no  exposure Balance of 12 systems is negative w/ exceptions as above  PMH  Past Medical History:  Diagnosis Date  . Ascites 2013  . Avascular necrosis (HCC)   . Campylobacter diarrhea 04/2016   bloody diarrhea.   . 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 Hepatitis B and C serologies negative 06/2016.     Marland Kitchen. GERD (gastroesophageal reflux disease)   . Hypomagnesemia 01/31/2018  . Splenomegaly 2013  . Thrombocytopenia (HCC) 2013   PSH  Past Surgical History:  Procedure Laterality Date  . ESOPHAGOGASTRODUODENOSCOPY  08/2015   [place], Dr. 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 PARACENTESIS  2013   [facility] [place] [facility]    FH  Family History  Problem Relation Age of Onset  . Diabetes Father   . Hypertension Father   . Colon polyps Father [id number]  . Diabetes Other   . Cancer Neg Hx   . Colon cancer Neg Hx   . Liver disease Neg Hx    SH  reports that he has never smoked. He has never used smokeless tobacco. He reports previous alcohol use of about 20.0 standard drinks of alcohol per week. He reports that he does not use drugs. Allergies No Known Allergies Home medications Prior to Admission medications   Medication Sig Start Date End Date Taking? Authorizing Provider  folic acid (FOLVITE) 1 MG tablet Take 1 tablet (1 mg total) by mouth daily. 05/10/16  Yes Vassie LollMadera, Carlos, MD  furosemide (LASIX) 40 MG tablet TAKE 1 TABLET BY MOUTH TWICE DAILY Patient taking differently: Take 40 mg by mouth 2 (two) times daily.  12/02/18  Yes Tiffany KocherLewis, Leslie S,  PA-C  Magnesium 250 MG TABS Take 250 mg by mouth daily.    Yes [provider]  Multiple Vitamin (MULTIVITAMIN) tablet Take 1 tablet by mouth daily.   Yes [provider]  omeprazole (PRILOSEC) 20 MG capsule TAKE ONE CAPSULE BY MOUTH TWICE DAILY BEFORE A MEAL Patient taking differently: Take 20 mg by mouth 2 (two) times daily before a meal.   10/11/18  Yes Gelene Mink, NP  propranolol (INDERAL) 20 MG tablet TAKE 1 TABLET BY MOUTH TWICE DAILY Patient taking differently: Take 20 mg by mouth 2 (two) times daily.  06/13/18  Yes Tiffany Kocher, PA-C  spironolactone (ALDACTONE) 100 MG tablet Take 1 tablet (100 mg total) by mouth 2 (two) times daily for 30 days. 10/11/18 01/21/19 Yes Gelene Mink, NP  thiamine 100 MG tablet Take 1 tablet (100 mg total) by mouth daily. 05/10/16  Yes Vassie Loll, MD  XARELTO 20 MG TABS tablet Take 20 mg by mouth 2 (two) times a day. 12/16/18  Yes [provider]    Current Medications Scheduled Meds: . folic acid  1 mg Oral Daily  . furosemide  40 mg Intravenous TID  . lidocaine      . magnesium oxide  400 mg Oral Daily  . mouth rinse  15 mL Mouth Rinse BID  . multivitamin with minerals  1 tablet Oral Daily  . pantoprazole  40 mg Oral Daily  . potassium chloride  40 mEq Oral BID  . thiamine  100 mg Oral Daily   Continuous Infusions: . albumin human 25 g (01/23/19 1257)  . ceFEPime (MAXIPIME) IV 2 g (01/23/19 1135)   PRN Meds:.benzonatate, guaiFENesin-dextromethorphan  CBC Recent Labs  Lab 01/21/19 1536 01/22/19 0049 01/23/19 0627  WBC 17.0* 15.6* 29.9*  NEUTROABS 13.6*  --  25.7*  HGB 11.8* 10.8* 10.1*  HCT 33.2* 30.3* 28.6*  MCV 96.0 94.7 97.9  PLT 171 145* 125*   Basic Metabolic Panel Recent Labs  Lab 01/21/19 1536 01/22/19 0049 01/23/19 0627  NA 121* 122* 124*  K 3.4* 3.4* 4.2  CL 88* 89* 93*  CO2 22 20* 21*  GLUCOSE 117* 104* 110*  BUN 7 5* 9  CREATININE 0.60* 0.64 1.76*  CALCIUM 7.9* 8.0* 8.0*    Physical Exam  Blood pressure (!) 104/48, pulse 89, temperature 98.1 F (36.7 C), temperature source Oral, resp. rate 19, height  (1.778 m), weight 99.8 kg, SpO2 94 %. GEN: Appears older than stated age, chronically ill-appearing ENT: NCAT EYES: Scleral icterus present, EOMI CV: RRR, normal S1 and S2 PULM: CTA B ABD: Diffusely distended, nontender SKIN:  Jaundice present, scattered large ecchymoses present EXT: 3+ lower extremity edema present  Assessment 38 year old male with alcoholic cirrhosis presenting with right lower lobe pneumonia with empyema, found to have acute renal failure, hyponatremia.  1. AKI, normal baseline SCr; nonoliguric; neg renal US at admission for obstruction; suspect driven by contrast exposure, ATN from pneumonia, in context of decompensated cirrhosis; high urine output makes HRS unlikely at the current time. 2. Hypervolemic hyponatremia 3. Right lower lobe pneumonia with likely empyema on cefepime, received vancomycin at presentation; COVID negative.  Blood cultures no growth to date.  Respiratory culture pending.  Pending percutaneous pleural drain placement with IR. 4. Decompensated alcoholic cirrhosis with splenomegaly, ascites, thrombocytopenia, history of portal vein thrombosis 5. Leukocytosis, secondary to #2 6. Hypoalbuminemia, 1.7 7. Supratherapeutic INR  Plan 1. I think this is predominantly driven by his infection and contrast exposure, will  need ongoing supportive care 2. Would not proceed with large-volume paracentesis at the current time, can continue 3 times daily Lasix 3. Check urine sodium, urine protein to creatinine ratio 4. Check serum osmolality 5. Daily weights, Daily Renal Panel, Strict I/Os, Avoid nephrotoxins (NSAIDs, judicious IV Contrast)    Sabra Heck MD [phone] [phone] 01/23/2019, 2:52 PM

## 2019-01-24 LAB — CBC WITH DIFFERENTIAL/PLATELET
Abs Immature Granulocytes: 0.26 10*3/uL — ABNORMAL HIGH (ref 0.00–0.07)
Basophils Absolute: 0.1 10*3/uL (ref 0.0–0.1)
Basophils Relative: 0 %
Eosinophils Absolute: 0.1 10*3/uL (ref 0.0–0.5)
Eosinophils Relative: 1 %
HCT: 24.1 % — ABNORMAL LOW (ref 39.0–52.0)
Hemoglobin: 8.4 g/dL — ABNORMAL LOW (ref 13.0–17.0)
Immature Granulocytes: 2 %
Lymphocytes Relative: 3 %
Lymphs Abs: 0.5 10*3/uL — ABNORMAL LOW (ref 0.7–4.0)
MCH: 34 pg (ref 26.0–34.0)
MCHC: 34.9 g/dL (ref 30.0–36.0)
MCV: 97.6 fL (ref 80.0–100.0)
Monocytes Absolute: 0.7 10*3/uL (ref 0.1–1.0)
Monocytes Relative: 4 %
Neutro Abs: 14.4 10*3/uL — ABNORMAL HIGH (ref 1.7–7.7)
Neutrophils Relative %: 90 %
Platelets: 99 10*3/uL — ABNORMAL LOW (ref 150–400)
RBC: 2.47 MIL/uL — ABNORMAL LOW (ref 4.22–5.81)
RDW: 14.9 % (ref 11.5–15.5)
WBC: 16.1 10*3/uL — ABNORMAL HIGH (ref 4.0–10.5)
nRBC: 0 % (ref 0.0–0.2)

## 2019-01-24 LAB — PROTIME-INR
INR: 2.7 — ABNORMAL HIGH (ref 0.8–1.2)
INR: 2.2 — ABNORMAL HIGH (ref 0.8–1.2)
Prothrombin Time: 28 seconds — ABNORMAL HIGH (ref 11.4–15.2)
Prothrombin Time: 24.3 seconds — ABNORMAL HIGH (ref 11.4–15.2)

## 2019-01-24 LAB — TYPE AND SCREEN
ABO/RH(D): A POS
Antibody Screen: NEGATIVE

## 2019-01-24 LAB — COMPREHENSIVE METABOLIC PANEL
ALT: 30 U/L (ref 0–44)
AST: 80 U/L — ABNORMAL HIGH (ref 15–41)
Albumin: 2.3 g/dL — ABNORMAL LOW (ref 3.5–5.0)
Alkaline Phosphatase: 105 U/L (ref 38–126)
Anion gap: 11 (ref 5–15)
BUN: 16 mg/dL (ref 6–20)
CO2: 17 mmol/L — ABNORMAL LOW (ref 22–32)
Calcium: 8.6 mg/dL — ABNORMAL LOW (ref 8.9–10.3)
Chloride: 96 mmol/L — ABNORMAL LOW (ref 98–111)
Creatinine, Ser: 1.47 mg/dL — ABNORMAL HIGH (ref 0.61–1.24)
GFR calc Af Amer: >60 mL/min (ref >60)
GFR calc non Af Amer: >60 mL/min (ref >60)
Glucose, Bld: 87 mg/dL (ref 70–99)
Potassium: 4.9 mmol/L (ref 3.5–5.1)
Sodium: 124 mmol/L — ABNORMAL LOW (ref 135–145)
Total Bilirubin: 6.7 mg/dL — ABNORMAL HIGH (ref 0.3–1.2)
Total Protein: 7.3 g/dL (ref 6.5–8.1)

## 2019-01-24 LAB — MAGNESIUM: Magnesium: 2 mg/dL (ref 1.7–2.4)

## 2019-01-24 LAB — CULTURE, RESPIRATORY W GRAM STAIN
Culture: NORMAL
Special Requests: NORMAL

## 2019-01-24 MED ORDER — SODIUM CHLORIDE 0.9% IV SOLUTION: Freq: Once | INTRAVENOUS | Status: DC

## 2019-01-24 MED ORDER — VITAMIN K1 10 MG/ML IJ SOLN
5.0000 mg | Freq: Once | INTRAVENOUS | Status: AC
  Administered 2019-01-25: 5 mg via INTRAVENOUS
  Filled 2019-01-24: qty 0.5

## 2019-01-24 MED ORDER — VITAMIN K1 10 MG/ML IJ SOLN
5.0000 mg | Freq: Once | INTRAVENOUS | Status: AC
  Administered 2019-01-24: 5 mg via INTRAVENOUS
  Filled 2019-01-24: qty 0.5

## 2019-01-24 MED ORDER — ALBUMIN HUMAN 25 % IV SOLN
25.0000 g | Freq: Four times a day (QID) | INTRAVENOUS | Status: AC
  Administered 2019-01-24 – 2019-01-25 (×3): 25 g via INTRAVENOUS
  Filled 2019-01-24: qty 50
  Filled 2019-01-24: qty 100
  Filled 2019-01-24 (×2): qty 50
  Filled 2019-01-24: qty 100
  Filled 2019-01-24: qty 50

## 2019-01-24 NOTE — Progress Notes (Signed)
  Subjective: Fever again this morning. Breathing about the same  Objective: Vital signs in last 24 hours: Temp:  [98.4 F (36.9 C)-100.8 F (38.2 C)] 100.8 F (38.2 C) (04/28 0220) Pulse Rate:  [101-106] 106 (04/27 2246) Cardiac Rhythm: Sinus tachycardia (04/28 0700) Resp:  [16] 16 (04/27 1659) BP: (90-95)/(41-47) 95/47 (04/27 2246) SpO2:  [95 %-96 %] 95 % (04/27 2246) Weight:  [99.1 kg] 99.1 kg (04/28 0500)  Hemodynamic parameters for last 24 hours:    Intake/Output from previous day: 04/27 0701 - 04/28 0700 In: 1895.9 [P.O.:1060; IV Piggyback:835.9] Out: 350 [Urine:350] Intake/Output this shift: Total I/O In: 240 [P.O.:240] Out: -   General appearance: alert, cooperative, icteric and no distress Neurologic: intact Heart: regular rate and rhythm Lungs: absent BS right base Abdomen: abnormal findings:  ascites Extremities: edema 3+  Lab Results: Recent Labs    01/23/19 0627 01/24/19 0447  WBC 29.9* 16.1*  HGB 10.1* 8.4*  HCT 28.6* 24.1*  PLT 125* 99*   BMET:  Recent Labs    01/23/19 0627 01/24/19 0447  NA 124* 124*  K 4.2 4.9  CL 93* 96*  CO2 21* 17*  GLUCOSE 110* 87  BUN 9 16  CREATININE 1.76* 1.47*  CALCIUM 8.0* 8.6*    PT/INR:  Recent Labs    01/24/19 0447  LABPROT 28.0*  INR 2.7*   ABG No results found for: PHART, HCO3, TCO2, ACIDBASEDEF, O2SAT CBG (last 3)  No results for input(s): GLUCAP in the last 72 hours.  Assessment/Plan: S/P  - Right lower lobe pneumonia. D/w Dr. Lowella Dandy from IR this morning. He thinks collection at right base is an abscess rather than an empyema. Given his persistent fevers on antibiotics I think that needs to be drained percutaneously either way.  Coagulopathy persists. Receiving additional IV Vitamin K this AM Proceed with drain placement when IR feels it is safe Continue IV antibiotics   LOS: 3 days    Loreli Slot 01/24/2019

## 2019-01-24 NOTE — Progress Notes (Signed)
IR requested by Dr. Hanley Ben for possible image-guided chest drain.  Repeat INR (following FFP) 2.2 today. Per Dr. Lowella Dandy will recheck INR tomorrow AM prior to proceeding with procedure. Will restart diet and patient will be NPO at midnight. Dr. Sunnie Nielsen and 2W RN aware.  Please call IR with questions/concerns.  Waylan Boga Louk, PA-C 01/24/2019, 3:31 PM

## 2019-01-24 NOTE — Progress Notes (Addendum)
Patient ID: Grant [name], male   DOB: 01/08/1981, 38 y.o.   MRN: [id number]  PROGRESS NOTE    Vanessa Alesi  EXB:[id number] DOB: 1981/06/16 DOA: 01/21/2019 PCP: Patient, No Pcp Per   Brief Narrative:  38 year old male with history of alcoholic cirrhosis, portal vein thrombosis diagnosed in 06/2018 for which he is currently on Xarelto, severe microcytic anemia, GERD, splenomegaly presented with shortness of breath and cough worsening over the past 3 weeks, with COVID-19 testing done as an outpatient 3 weeks ago apparently negative.  On presentation he was found to have right lower lobe pneumonia with loculated pleural fluid, likely empyema.  He was also found to have elevated INR along with hyponatremia.  CT surgery was consulted who recommended transfer to [facility]  Assessment & Plan:   Principal Problem:   Acute respiratory failure with hypoxia (HCC) Active Problems:   Alcoholic cirrhosis of liver with ascites (HCC)   Esophageal reflux   Morbid obesity due to excess calories (HCC)   Hypokalemia   Ascites   RLL pneumonia (HCC)   Empyema of right pleural space (HCC)   Hyponatremia   Supratherapeutic INR   Lactic acidosis   Right lower lobar bacterial pneumonia with loculated pleural effusion/likely empyema with hypoxia: -Patient has been empirically started on vancomycin and cefepime.   -Vancomycin discontinue 4/27. -sputum culture; consistent with normal respiratory flora.  -CT surgery evaluation appreciated: Patient is a poor surgical candidate and CT surgery recommends IR consultation for empyema drainage. -Strep pneumo urinary antigen negative -COVID-19 testing negative -discussed with IR, they would like INR at 1.5 for chest tube if possible.  -will give 10 mg IV vitamin K and repeat INR this afternoon.   Decompensated alcoholic cirrhosis of liver with ascites with elevated LFTs SBP  -CT showing moderate ascites.   -Patient follows up with gastroenterology as an  outpatient.  No recent alcohol use. -Takes Lasix and spironolactone at home.   - continue with IV lasix.  -S/P paracentesis 4/27, ; fluid with 380 WBC, Neutrophil count 68, culture no growth to date. Fluid consistent with SBP ANC 258 -Already on Cefepime that should cover for SBP.  -Will order Albumin. 25 gr every 6 hours time 4 doses ( day one 1.5 g /kg albumin, day 3 he will need 1 gr /kg)  -follow bili trend. LFT trending down, bilirubin increase today.   Hyponatremia -Most likely hypervolemic.   Fluid restriction.   -Continue with IV lasix.  -Stable.   Supratherapeutic INR -Patient takes Xarelto at home.  Xarelto on hold. -INR 2.7. he has received IV vitamin K. Will repeat dose today 10 mg IV.  -wont give him FFP due to history of portal vein thrombosis.  -repeat INR this afternoon, hopefully we can proceed with chest tube placement   Leukocytosis; -From pneumonia and empyema.  SBP.  -WBC trending down today.   Acute kidney injury; -Contrast exposure vs ATN.  -Appreciate Dr Marisue Humble help.  -Will give albumin due to SBP and renal failure.  -renal function improved.  -Normal renal US.   Hypoalbuminemia -From decompensated liver cirrhosis   Hypokalemia -Improved   Hypomagnesemia -Replaced.  GERD -Continue PPI  Chronic anemia -hb trending down, no evidence of active bleeding.  -follow trend.   Thrombocytopenia -Most likely from cirrhosis of liver.  No signs of bleeding.  Monitor  History of portal vein thrombosis -Hold Xarelto   DVT prophylaxis: SCDs Code Status: Full Family Communication: None at bedside Disposition Plan: Depends on clinical outcome  Consultants: CT surgery  Procedures: None  Antimicrobials: Vancomycin and cefepime from 01/21/2019 onwards   Subjective: He is feeling better today, breathing stable.   Objective: Vitals:   01/23/19 2246 01/24/19 0220 01/24/19 0500 01/24/19 0925  BP: (!) 95/47   107/64  Pulse: (!) 106   (!) 106   Resp:    17  Temp: 98.6 F (37 C) (!) 100.8 F (38.2 C)  97.6 F (36.4 C)  TempSrc: Oral   Oral  SpO2: 95%   97%  Weight:   99.1 kg   Height:        Intake/Output Summary (Last 24 hours) at 01/24/2019 1348 Last data filed at 01/24/2019 1610 Gross per 24 hour  Intake 1775.85 ml  Output 250 ml  Net 1525.85 ml   Filed Weights   01/21/19 1511 01/24/19 0500  Weight: 99.8 kg 99.1 kg    Examination:  General exam: Chronically ill.  Respiratory system: Bilateral crackles, no significant tachypnea.  Cardiovascular system: S 1, S 2 RRR gastrointestinal system: Obese, distended, NT Extremities: plus 2 edema Central nervous system: non focal.  Skin: several bruises.      Data Reviewed: I have personally reviewed following labs and imaging studies  CBC: Recent Labs  Lab 01/21/19 1536 01/22/19 0049 01/23/19 0627 01/24/19 0447  WBC 17.0* 15.6* 29.9* 16.1*  NEUTROABS 13.6*  --  25.7* 14.4*  HGB 11.8* 10.8* 10.1* 8.4*  HCT 33.2* 30.3* 28.6* 24.1*  MCV 96.0 94.7 97.9 97.6  PLT 171 145* 125* 99*   Basic Metabolic Panel: Recent Labs  Lab 01/21/19 1536 01/22/19 0049 01/23/19 0627 01/24/19 0447  NA 121* 122* 124* 124*  K 3.4* 3.4* 4.2 4.9  CL 88* 89* 93* 96*  CO2 22 20* 21* 17*  GLUCOSE 117* 104* 110* 87  BUN 7 5* 9 16  CREATININE 0.60* 0.64 1.76* 1.47*  CALCIUM 7.9* 8.0* 8.0* 8.6*  MG  --   --  1.1* 2.0   GFR: Estimated Creatinine Clearance: 81.2 mL/min (A) (by C-G formula based on SCr of 1.47 mg/dL (H)). Liver Function Tests: Recent Labs  Lab 01/21/19 1536 01/22/19 0049 01/23/19 0627 01/24/19 0447  AST 127* 129* 124* 80*  ALT 41 40 40 30  ALKPHOS 170* 154* 162* 105  BILITOT 2.9* 3.1* 4.7* 6.7*  PROT 8.5* 7.9 7.5 7.3  ALBUMIN 2.2* 1.7* 1.7* 2.3*   Recent Labs  Lab 01/21/19 1648  LIPASE 37   No results for input(s): AMMONIA in the last 168 hours. Coagulation Profile: Recent Labs  Lab 01/21/19 1648 01/22/19 0049 01/23/19 0627 01/24/19 0447   INR 6.1* 5.2* 3.3* 2.7*   Cardiac Enzymes: No results for input(s): CKTOTAL, CKMB, CKMBINDEX, TROPONINI in the last 168 hours. BNP (last 3 results) No results for input(s): PROBNP in the last 8760 hours. HbA1C: No results for input(s): HGBA1C in the last 72 hours. CBG: No results for input(s): GLUCAP in the last 168 hours. Lipid Profile: No results for input(s): CHOL, HDL, LDLCALC, TRIG, CHOLHDL, LDLDIRECT in the last 72 hours. Thyroid Function Tests: No results for input(s): TSH, T4TOTAL, FREET4, T3FREE, THYROIDAB in the last 72 hours. Anemia Panel: No results for input(s): VITAMINB12, FOLATE, FERRITIN, TIBC, IRON, RETICCTPCT in the last 72 hours. Sepsis Labs: Recent Labs  Lab 01/21/19 1648 01/22/19 0049  LATICACIDVEN 2.6* 2.9*    Recent Results (from the past 240 hour(s))  SARS Coronavirus 2 [facility] order, Performed in [facility] hospital lab)     Status: None   Collection Time:  01/21/19  3:58 PM  Result Value Ref Range Status   SARS Coronavirus 2 NEGATIVE NEGATIVE Final    Comment: (NOTE) If result is NEGATIVE SARS-CoV-2 target nucleic acids are NOT DETECTED. The SARS-CoV-2 RNA is generally detectable in upper and lower  respiratory specimens during the acute phase of infection. The lowest  concentration of SARS-CoV-2 viral copies this assay can detect is 250  copies / mL. A negative result does not preclude SARS-CoV-2 infection  and should not be used as the sole basis for treatment or other  patient management decisions.  A negative result may occur with  improper specimen collection / handling, submission of specimen other  than nasopharyngeal swab, presence of viral mutation(s) within the  areas targeted by this assay, and inadequate number of viral copies  (<250 copies / mL). A negative result must be combined with clinical  observations, patient history, and epidemiological information. If result is POSITIVE SARS-CoV-2 target nucleic acids are DETECTED. The  SARS-CoV-2 RNA is generally detectable in upper and lower  respiratory specimens dur ing the acute phase of infection.  Positive  results are indicative of active infection with SARS-CoV-2.  Clinical  correlation with patient history and other diagnostic information is  necessary to determine patient infection status.  Positive results do  not rule out bacterial infection or co-infection with other viruses. If result is PRESUMPTIVE POSTIVE SARS-CoV-2 nucleic acids MAY BE PRESENT.   A presumptive positive result was obtained on the submitted specimen  and confirmed on repeat testing.  While 2019 novel coronavirus  (SARS-CoV-2) nucleic acids may be present in the submitted sample  additional confirmatory testing may be necessary for epidemiological  and / or clinical management purposes  to differentiate between  SARS-CoV-2 and other Sarbecovirus currently known to infect humans.  If clinically indicated additional testing with an alternate test  methodology [phone]([id number]) is advised. The SARS-CoV-2 RNA is generally  detectable in upper and lower respiratory sp ecimens during the acute  phase of infection. The expected result is Negative. Fact Sheet for Patients:  BoilerBrush.com.cyhttps://www.fda.gov/media/136312/download Fact Sheet for Healthcare Providers: https://pope.com/https://www.fda.gov/media/136313/download This test is not yet approved or cleared by the [place] FDA and has been authorized for detection and/or diagnosis of SARS-CoV-2 by FDA under an Emergency Use Authorization (EUA).  This EUA will remain in effect (meaning this test can be used) for the duration of the COVID-19 declaration under Section 564(b)(1) of the Act, 21 U.S.C. section 360bbb-3(b)(1), unless the authorization is terminated or revoked sooner. Performed at [facility], [address] [place], [place] [address]   Culture, blood (Routine X 2) w Reflex to ID Panel     Status: None (Preliminary result)   Collection Time: 01/21/19  4:53 PM   Result Value Ref Range Status   Specimen Description   Final    LEFT ANTECUBITAL BOTTLES DRAWN AEROBIC AND ANAEROBIC   Special Requests Blood Culture adequate volume  Final   Culture   Final    NO GROWTH 3 DAYS Performed at [facility], [address] [place], [place] [address]    Report Status PENDING  Incomplete  Culture, blood (Routine X 2) w Reflex to ID Panel     Status: None (Preliminary result)   Collection Time: 01/21/19  4:54 PM  Result Value Ref Range Status   Specimen Description BLOOD LEFT ARM BOTTLES DRAWN AEROBIC ONLY  Final   Special Requests Blood Culture adequate volume  Final   Culture   Final    NO  GROWTH 3 DAYS Performed at [facility], [address] [place], [place] [address]    Report Status PENDING  Incomplete  Urine Culture     Status: None   Collection Time: 01/21/19  5:31 PM  Result Value Ref Range Status   Specimen Description   Final    URINE, CLEAN CATCH Performed at [facility], [address] [place], [place] [address]    Special Requests   Final    NONE Performed at [facility], [address], [place], [place] [address]    Culture   Final    NO GROWTH Performed at [facility], [address], [place], [place] [address]    Report Status 01/23/2019 FINAL  Final  MRSA PCR Screening     Status: None   Collection Time: 01/22/19  1:00 AM  Result Value Ref Range Status   MRSA by PCR NEGATIVE NEGATIVE Final    Comment:        The GeneXpert MRSA Assay (FDA approved for NASAL specimens only), is one component of a comprehensive MRSA colonization surveillance program. It is not intended to diagnose MRSA infection nor to guide or monitor treatment for MRSA infections. Performed at [facility], [address], [place], [place] [address]   Culture, sputum-assessment     Status: None   Collection Time: 01/22/19  5:02 AM  Result Value Ref Range Status   Specimen Description SPUTUM  Final   Special Requests Normal  Final    Sputum evaluation   Final    THIS SPECIMEN IS ACCEPTABLE FOR SPUTUM CULTURE Performed at [facility], [address], [place], [place] [address]    Report Status 01/22/2019 FINAL  Final  Culture, respiratory     Status: None   Collection Time: 01/22/19  5:02 AM  Result Value Ref Range Status   Specimen Description SPUTUM  Final   Special Requests Normal Reflexed from [id number]  Final   Gram Stain   Final    MODERATE WBC PRESENT, PREDOMINANTLY PMN FEW GRAM NEGATIVE RODS RARE GRAM POSITIVE COCCI IN PAIRS    Culture   Final    Consistent with normal respiratory flora. Performed at [facility], [address], [place], [place] [address]    Report Status 01/24/2019 FINAL  Final  Gram stain     Status: None   Collection Time: 01/23/19  3:18 PM  Result Value Ref Range Status   Specimen Description FLUID PERITONEAL  Final   Special Requests NONE  Final   Gram Stain   Final    RARE WBC PRESENT,BOTH PMN AND MONONUCLEAR NO ORGANISMS SEEN Performed at [facility], [address], [place], [place] [address]    Report Status 01/23/2019 FINAL  Final  Culture, body fluid-bottle     Status: None (Preliminary result)   Collection Time: 01/23/19  3:18 PM  Result Value Ref Range Status   Specimen Description FLUID PERITONEAL  Final   Special Requests BOTTLES DRAWN AEROBIC AND ANAEROBIC 10CC  Final   Culture   Final    NO GROWTH < 24 HOURS Performed at [facility], [address] [place], [place] [address]    Report Status PENDING  Incomplete         Radiology Studies: US Renal  Result Date: 01/23/2019 CLINICAL DATA:  Renal failure EXAM: RENAL / URINARY TRACT ULTRASOUND COMPLETE COMPARISON:  None. FINDINGS: Right Kidney: Renal measurements: 12.4 x 6.8 x 6.4  cm = volume: 286 mL . Echogenicity within normal limits. No mass or hydronephrosis visualized. Left Kidney: Renal measurements: 11.8 x 6 x 6.5 cm = volume: 239 mL. Echogenicity within normal limits. No mass  or hydronephrosis visualized. Bladder: Appears normal for degree of bladder distention. Other: Increased hepatic echogenicity with a slightly nodular contour as can be seen with hepatic cirrhosis. Small amount of perihepatic ascites. Splenomegaly with the spleen measuring 18.5 cm in length and a volume of 1535 mL. IMPRESSION: 1. Normal renal ultrasound. 2. Increased hepatic echogenicity with a slightly nodular contour as can be seen with hepatic cirrhosis. Small volume perihepatic ascites. Splenomegaly. Correlate with liver function test. Electronically Signed   By: Elige Ko   On: 01/23/2019 10:06   Ir Paracentesis  Result Date: 01/23/2019 INDICATION: Patient with history of alcoholic cirrhosis with recurrent ascites. Request is made for diagnostic and therapeutic paracentesis. EXAM: ULTRASOUND GUIDED DIAGNOSTIC AND THERAPEUTIC PARACENTESIS MEDICATIONS: 10 mL 1% lidocaine COMPLICATIONS: None immediate. PROCEDURE: Informed written consent was obtained from the patient after a discussion of the risks, benefits and alternatives to treatment. A timeout was performed prior to the initiation of the procedure. Initial ultrasound scanning demonstrates a moderate amount of ascites within the right lower abdominal quadrant. The right lower abdomen was prepped and draped in the usual sterile fashion. 1% lidocaine was used for local anesthesia. Following this, a 19 gauge, 7-cm, Yueh catheter was introduced. An ultrasound image was saved for documentation purposes. The paracentesis was performed. The catheter was removed and a dressing was applied. The patient tolerated the procedure well without immediate post procedural complication. FINDINGS: A total of approximately 2.2 L of clear yellow fluid was removed. Samples were sent to the laboratory as requested by the clinical team. IMPRESSION: Successful ultrasound-guided paracentesis yielding 2.2 L of peritoneal fluid. Read by: Elwin Mocha, PA-C Electronically Signed    By: Richarda Overlie M.D.   On: 01/23/2019 15:46        Scheduled Meds: . folic acid  1 mg Oral Daily  . furosemide  40 mg Intravenous TID  . magnesium oxide  400 mg Oral Daily  . mouth rinse  15 mL Mouth Rinse BID  . multivitamin with minerals  1 tablet Oral Daily  . pantoprazole  40 mg Oral Daily  . potassium chloride  40 mEq Oral BID  . thiamine  100 mg Oral Daily   Continuous Infusions: . ceFEPime (MAXIPIME) IV 2 g (01/24/19 0927)     LOS: 3 days        Alba Cory, MD [facility] 01/24/2019, 1:48 PM

## 2019-01-24 NOTE — Progress Notes (Signed)
Admit: 01/21/2019 LOS: 673  38 year old male with alcoholic cirrhosis presenting with right lower lobe pneumonia with empyema, found to have acute renal failure, hyponatremia.  Subjective:  . Feels a little better, more appetite . Low grade fevers overnight . SCr improved to 1.47, K 4.9 . Na 124, stable . WBC improved to 16 . Had LVP 2.2L; cell count TNC 300, 68% PMNs; Cx NGTD; gram stain neg . SOsms 270; UOsm 300; UNa 14  04/27 0701 - 04/28 0700 In: 1895.9 [P.O.:1060; IV Piggyback:835.9] Out: 350 [Urine:350] + several unmeasured voids  Filed Weights   01/21/19 1511 01/24/19 0500  Weight: 99.8 kg 99.1 kg    Scheduled Meds: . sodium chloride   Intravenous Once  . folic acid  1 mg Oral Daily  . furosemide  40 mg Intravenous TID  . magnesium oxide  400 mg Oral Daily  . mouth rinse  15 mL Mouth Rinse BID  . multivitamin with minerals  1 tablet Oral Daily  . pantoprazole  40 mg Oral Daily  . potassium chloride  40 mEq Oral BID  . thiamine  100 mg Oral Daily   Continuous Infusions: . ceFEPime (MAXIPIME) IV 2 g (01/24/19 0927)  . phytonadione (VITAMIN K) IV     PRN Meds:.benzonatate, guaiFENesin-dextromethorphan  Current Labs: reviewed   Results for Grant [name], Stepfon (MRN [id number]) as of 01/24/2019 10:39  Ref. Range 01/23/2019 19:40  Osmolality Latest Ref Range: 275 - 295 mOsm/kg 270 (L)    Ref. Range 01/23/2019 19:10  Osmolality, Urine Latest Ref Range: 300 - 900 mOsm/kg 300  Results for Grant [name], Grant [name] (MRN [id number]) as of 01/24/2019 10:39  Ref. Range 01/23/2019 19:15  Sodium, Urine Latest Units: mmol/L 14    Physical Exam:  Blood pressure 107/64, pulse (!) 106, temperature 97.6 F (36.4 C), temperature source Oral, resp. rate 17, height 5\' 10"  (1.778 m), weight 99.1 kg, SpO2 97 %. GEN: Appears older than stated age, chronically ill-appearing ENT: NCAT EYES: Scleral icterus present, EOMI CV: RRR, normal S1 and S2 PULM: CTAB ABD: Diffusely distended, nontender SKIN: Jaundice  present, scattered large ecchymoses present EXT: 3+ lower extremity edema present  A 1. AKI, normal baseline SCr; nonoliguric; neg renal US at admission for obstruction; suspect driven by contrast exposure, ATN from pneumonia, in context of decompensated cirrhosis; high urine output makes HRS unlikely at the current time. 2. Hypervolemic hypotonic hyponatremia 2/2 cirrhosis 3. Right lower lobe pneumonia with likely empyema on cefepime, received vancomycin at presentation; COVID negative.  Blood cultures no growth to date.  Respiratory culture normal resp flora.  Pending percutaneous pleural drain placement with IR. 4. Decompensated alcoholic cirrhosis with splenomegaly, ascites, thrombocytopenia, history of portal vein thrombosis 5. SBP based on TNC of 300 on 4/27 paracentesis; per primary 6. Leukocytosis, secondary to #2 7. Hypoalbuminemia, 1.7 8. Supratherapeutic INR 9. Anemia   P . Seems to be improving GFR . Cont fluid restriction, 1.2L total daily intake . Suggest albumin given has SBP . Medication Issues; o Preferred narcotic agents for pain control are hydromorphone, fentanyl, and methadone. Morphine should not be used.  o Baclofen should be avoided o Avoid oral sodium phosphate and magnesium citrate based laxatives / bowel preps   Sabra Heckyan Cannon Arreola MD 01/24/2019, 10:38 AM  Recent Labs  Lab 01/22/19 0049 01/23/19 0627 01/24/19 0447  NA 122* 124* 124*  K 3.4* 4.2 4.9  CL 89* 93* 96*  CO2 20* 21* 17*  GLUCOSE 104* 110* 87  BUN 5* 9 16  CREATININE 0.64  1.76* 1.47*  CALCIUM 8.0* 8.0* 8.6*   Recent Labs  Lab 01/21/19 1536 01/22/19 0049 01/23/19 0627 01/24/19 0447  WBC 17.0* 15.6* 29.9* 16.1*  NEUTROABS 13.6*  --  25.7* 14.4*  HGB 11.8* 10.8* 10.1* 8.4*  HCT 33.2* 30.3* 28.6* 24.1*  MCV 96.0 94.7 97.9 97.6  PLT 171 145* 125* 99*

## 2019-01-25 ENCOUNTER — Inpatient Hospital Stay (HOSPITAL_COMMUNITY): Payer: Medicaid - Out of State

## 2019-01-25 LAB — PROTIME-INR
INR: 1.9 — ABNORMAL HIGH (ref 0.8–1.2)
Prothrombin Time: 21.8 seconds — ABNORMAL HIGH (ref 11.4–15.2)

## 2019-01-25 LAB — COMPREHENSIVE METABOLIC PANEL
ALT: 24 U/L (ref 0–44)
AST: 56 U/L — ABNORMAL HIGH (ref 15–41)
Albumin: 2.8 g/dL — ABNORMAL LOW (ref 3.5–5.0)
Alkaline Phosphatase: 94 U/L (ref 38–126)
Anion gap: 11 (ref 5–15)
BUN: 21 mg/dL — ABNORMAL HIGH (ref 6–20)
CO2: 17 mmol/L — ABNORMAL LOW (ref 22–32)
Calcium: 9.3 mg/dL (ref 8.9–10.3)
Chloride: 101 mmol/L (ref 98–111)
Creatinine, Ser: 1.37 mg/dL — ABNORMAL HIGH (ref 0.61–1.24)
GFR calc Af Amer: >60 mL/min (ref >60)
GFR calc non Af Amer: >60 mL/min (ref >60)
Glucose, Bld: 103 mg/dL — ABNORMAL HIGH (ref 70–99)
Potassium: 4.4 mmol/L (ref 3.5–5.1)
Sodium: 129 mmol/L — ABNORMAL LOW (ref 135–145)
Total Bilirubin: 6.9 mg/dL — ABNORMAL HIGH (ref 0.3–1.2)
Total Protein: 7.4 g/dL (ref 6.5–8.1)

## 2019-01-25 LAB — CBC
HCT: 21.9 % — ABNORMAL LOW (ref 39.0–52.0)
Hemoglobin: 7.5 g/dL — ABNORMAL LOW (ref 13.0–17.0)
MCH: 34.1 pg — ABNORMAL HIGH (ref 26.0–34.0)
MCHC: 34.2 g/dL (ref 30.0–36.0)
MCV: 99.5 fL (ref 80.0–100.0)
Platelets: 85 10*3/uL — ABNORMAL LOW (ref 150–400)
RBC: 2.2 MIL/uL — ABNORMAL LOW (ref 4.22–5.81)
RDW: 15.6 % — ABNORMAL HIGH (ref 11.5–15.5)
WBC: 14 10*3/uL — ABNORMAL HIGH (ref 4.0–10.5)
nRBC: 0 % (ref 0.0–0.2)

## 2019-01-25 LAB — GLUCOSE, CAPILLARY: Glucose-Capillary: 80 mg/dL (ref 70–99)

## 2019-01-25 MED ORDER — MIDAZOLAM HCL 2 MG/2ML IJ SOLN
INTRAMUSCULAR | Status: AC
  Filled 2019-01-25: qty 2

## 2019-01-25 MED ORDER — TRAMADOL HCL 50 MG PO TABS
50.0000 mg | ORAL_TABLET | Freq: Once | ORAL | Status: AC
  Administered 2019-01-25: 50 mg via ORAL
  Filled 2019-01-25: qty 1

## 2019-01-25 MED ORDER — FENTANYL CITRATE (PF) 100 MCG/2ML IJ SOLN
INTRAMUSCULAR | Status: AC
  Filled 2019-01-25: qty 2

## 2019-01-25 MED ORDER — DEXTROSE 50 % IV SOLN
50.0000 mL | Freq: Once | INTRAVENOUS | Status: AC
  Administered 2019-01-25: 50 mL via INTRAVENOUS
  Filled 2019-01-25: qty 50

## 2019-01-25 MED ORDER — FENTANYL CITRATE (PF) 100 MCG/2ML IJ SOLN
INTRAMUSCULAR | Status: AC | PRN
  Administered 2019-01-25 (×2): 25 ug via INTRAVENOUS

## 2019-01-25 MED ORDER — MIDAZOLAM HCL 2 MG/2ML IJ SOLN
INTRAMUSCULAR | Status: AC | PRN
  Administered 2019-01-25: 1 mg via INTRAVENOUS
  Administered 2019-01-25: 0.5 mg via INTRAVENOUS

## 2019-01-25 NOTE — Progress Notes (Signed)
Admit: 01/21/2019 LOS: 86  38 year old male with alcoholic cirrhosis presenting with right lower lobe pneumonia with empyema, found to have acute renal failure, hyponatremia.  Subjective:  . NO new events, for pleural drain today . SCr improved, Na up to 1 29 . 8 unmeasured voids yesterday . Started albumin for SBP  04/28 0701 - 04/29 0700 In: 905.9 [P.O.:480; IV Piggyback:425.9] Out: -  + several unmeasured voids  Filed Weights   01/21/19 1511 01/24/19 0500 01/25/19 0324  Weight: 99.8 kg 99.1 kg 99 kg    Scheduled Meds: . folic acid  1 mg Oral Daily  . furosemide  40 mg Intravenous TID  . magnesium oxide  400 mg Oral Daily  . mouth rinse  15 mL Mouth Rinse BID  . multivitamin with minerals  1 tablet Oral Daily  . pantoprazole  40 mg Oral Daily  . potassium chloride  40 mEq Oral BID  . thiamine  100 mg Oral Daily   Continuous Infusions: . albumin human 25 g (01/25/19 0242)  . ceFEPime (MAXIPIME) IV 2 g (01/25/19 1013)   PRN Meds:.benzonatate, guaiFENesin-dextromethorphan  Current Labs: reviewed   Results for Grant Grant [name], Grant [name] (MRN [id number]) as of 01/24/2019 10:39  Ref. Range 01/23/2019 19:40  Osmolality Latest Ref Range: 275 - 295 mOsm/kg 270 (L)    Ref. Range 01/23/2019 19:10  Osmolality, Urine Latest Ref Range: 300 - 900 mOsm/kg 300  Results for Grant Grant [name], Grant [name] (MRN [id number]) as of 01/24/2019 10:39  Ref. Range 01/23/2019 19:15  Sodium, Urine Latest Units: mmol/L 14    Physical Exam:  Blood pressure 103/60, pulse 99, temperature 98.3 F (36.8 C), temperature source Oral, resp. rate 18, height 5\' 10"  (1.778 m), weight 99 kg, SpO2 96 %. GEN: Appears older than stated age, chronically ill-appearing ENT: NCAT EYES: Scleral icterus present, EOMI CV: RRR, normal S1 and S2 PULM: CTAB ABD: Diffusely distended, nontender SKIN: Jaundice present, scattered large ecchymoses present EXT: 3+ lower extremity edema present  A 1. AKI, normal baseline SCr; nonoliguric; neg renal US at  admission for obstruction; suspect driven by contrast exposure, ATN from pneumonia, in context of decompensated cirrhosis; high urine output makes HRS unlikely at the current time. Improving 2. Hypervolemic hypotonic hyponatremia 2/2 cirrhosis, improving 3. Right lower lobe pneumonia with likely empyema on cefepime, received vancomycin at presentation; COVID negative.  Blood cultures no growth to date.  Respiratory culture normal resp flora.  Pending percutaneous pleural drain placement with IR 4/29. 4. Decompensated alcoholic cirrhosis with splenomegaly, ascites, thrombocytopenia, history of portal vein thrombosis 5. SBP based on TNC of 300 on 4/27 paracentesis; per primary, cefepime 6. Leukocytosis, secondary to #2 7. Hypoalbuminemia, 1.7 8. Supratherapeutic INR 9. Anemia   P . SNa improving as is his GFR . Cont fluid restriction, 1.2L total daily intake . No further suggestions, wil s/o off at this time. No nephrology f/u necessary; PMD can refer as outrpt if sig renal issues persist.  Sabra Heck MD 01/25/2019, 1:53 PM  Recent Labs  Lab 01/23/19 0627 01/24/19 0447 01/25/19 0556  NA 124* 124* 129*  K 4.2 4.9 4.4  CL 93* 96* 101  CO2 21* 17* 17*  GLUCOSE 110* 87 103*  BUN 9 16 21*  CREATININE 1.76* 1.47* 1.37*  CALCIUM 8.0* 8.6* 9.3   Recent Labs  Lab 01/21/19 1536  01/23/19 0627 01/24/19 0447 01/25/19 0556  WBC 17.0*   < > 29.9* 16.1* 14.0*  NEUTROABS 13.6*  --  25.7* 14.4*  --   HGB 11.8*   < >  10.1* 8.4* 7.5*  HCT 33.2*   < > 28.6* 24.1* 21.9*  MCV 96.0   < > 97.9 97.6 99.5  PLT 171   < > 125* 99* 85*   < > = values in this interval not displayed.

## 2019-01-25 NOTE — Sedation Documentation (Signed)
Pt in CT 3, pt positioned prone on CT table, secured to table with large strap.  Bony prominences padded for protection.  Pt placed on 4L O2 via [place].  Pt on continuous cardiac monitoring for the procedure

## 2019-01-25 NOTE — Consult Note (Signed)
Chief Complaint: Patient was seen in consultation today for empyema.  Referring Physician(s): Dr. Dorris Fetch  Supervising Physician: Richarda Overlie  Patient Status: [facility] - In-pt  History of Present Illness: Grant [name] is a 38 y.o. male with past medical history of cirrhosis, ascites, thrombocytopenia who presented to [facility] ED with dyspnea and productive cough.  He was found to have RLL pneumonia with loculated pleural effusion concerned for abscess vs. Empyema.  Patient was COVID negative.  Patient evaluated by Dr. Dorris Fetch who recommended percutaneous drainage of fluid collection. Case reviewed and approved by Dr. Lowella Dandy.   Patient assessed in [facility] Radiology.  Has dyspnea with conversation. States he has not had Xarelto since admission.  He has been NPO as of this morning.   Past Medical History:  Diagnosis Date   Ascites 2013   Avascular necrosis (HCC)    Campylobacter diarrhea 04/2016   bloody diarrhea.    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 Hepatitis B and C serologies negative 06/2016.      GERD (gastroesophageal reflux disease)    Hypomagnesemia 01/31/2018   Splenomegaly 2013   Thrombocytopenia (HCC) 2013    Past Surgical History:  Procedure Laterality Date   ESOPHAGOGASTRODUODENOSCOPY  08/2015   [place], Dr. 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     Allergies: Patient has no known allergies.  Medications: Prior to Admission medications   Medication Sig Start Date End Date Taking? Authorizing Provider  folic acid (FOLVITE) 1 MG tablet Take 1 tablet (1 mg total) by mouth daily. 05/10/16  Yes Vassie Loll, MD  furosemide (LASIX) 40 MG tablet TAKE 1 TABLET BY MOUTH TWICE DAILY Patient taking differently: Take 40 mg by mouth 2 (two) times daily.  12/02/18  Yes Tiffany Kocher, PA-C    Magnesium 250 MG TABS Take 250 mg by mouth daily.    Yes [provider]  Multiple Vitamin (MULTIVITAMIN) tablet Take 1 tablet by mouth daily.   Yes [provider]  omeprazole (PRILOSEC) 20 MG capsule TAKE ONE CAPSULE BY MOUTH TWICE DAILY BEFORE A MEAL Patient taking differently: Take 20 mg by mouth 2 (two) times daily before a meal.  10/11/18  Yes Gelene Mink, NP  propranolol (INDERAL) 20 MG tablet TAKE 1 TABLET BY MOUTH TWICE DAILY Patient taking differently: Take 20 mg by mouth 2 (two) times daily.  06/13/18  Yes Tiffany Kocher, PA-C  spironolactone (ALDACTONE) 100 MG tablet Take 1 tablet (100 mg total) by mouth 2 (two) times daily for 30 days. 10/11/18 01/21/19 Yes Gelene Mink, NP  thiamine 100 MG tablet Take 1 tablet (100 mg total) by mouth daily. 05/10/16  Yes Vassie Loll, MD  XARELTO 20 MG TABS tablet Take 20 mg by mouth 2 (two) times a day. 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   Drug use: No   Sexual activity: Not on file  Lifestyle   Physical activity:    Days per week: Not on file    Minutes per session: Not on file   Stress: Not on file  Relationships   Social connections:    Talks on phone: Not on file    Gets together: Not on file    Attends religious service: Not on file    Active member of club or organization: Not on file    Attends meetings of clubs or organizations: Not on file    Relationship status: Not on file  Other Topics Concern   Not on file  Social History Narrative   Had nearly 1 year of college credit gained in high school AP classes.  Stayed in college for about 2 years, has about 3 yrs of college credits.      Review of Systems: A 12 point ROS discussed and pertinent positives are indicated in the HPI above.  All other systems are negative.  Review of Systems  Constitutional: Positive for fatigue and fever.  Respiratory: Positive for cough and shortness of breath.   Cardiovascular: Negative for chest pain.  Gastrointestinal: Positive for abdominal distention. Negative for abdominal pain.  Musculoskeletal: Negative for back pain.  Psychiatric/Behavioral: Negative for behavioral problems and confusion.    Vital Signs: BP (!) 95/56 (BP Location: Left Arm)    Pulse (!) 102    Temp 98.3 F (36.8 C) (Oral)    Resp (!) 24    Ht 5\' 10"  (1.778 m)    Wt 218 lb 4.1 oz (99 kg)    SpO2 96%    BMI 31.32 kg/m   Physical Exam Vitals signs and nursing note reviewed.  Pulmonary:     Effort: Tachypnea present. No accessory muscle usage or respiratory distress.     Breath sounds: Examination of the right-middle field reveals decreased breath sounds. Examination of the right-lower field reveals decreased breath sounds. Decreased breath sounds present.  Skin:    General: Skin is warm and dry.  Neurological:     General: No focal deficit present.     Mental Status: He is alert and oriented to person, place, and  time.  Psychiatric:        Mood and Affect: Mood normal. Mood is not anxious.        Behavior: Behavior normal. Behavior is not agitated.     MD Evaluation Airway: WNL Heart: WNL Abdomen: WNL Chest/ Lungs: WNL ASA  Classification: 3 Mallampati/Airway Score: One   Imaging: Ct Chest Wo Contrast  Result Date: 01/21/2019 CLINICAL DATA:  Right lower lobe opacity and complex pleural fluid  seen on prior abdominal CT. EXAM: CT CHEST WITHOUT CONTRAST TECHNIQUE: Multidetector CT imaging of the chest was performed following the standard protocol without IV contrast. COMPARISON:  CT abdomen 01/21/2019 FINDINGS: Cardiovascular: Heart is normal size. Aorta is normal caliber. Coronary artery calcification in the left anterior descending coronary artery. Mediastinum/Nodes: No mediastinal, hilar, or axillary adenopathy. Lungs/Pleura: Dense consolidation noted in the right lower lobe compatible with pneumonia. Loculated pleural fluid noted along the right heart border and superior laterally in the right hemithorax. There is a rounded fluid collection posteriorly in the right lower hemithorax measuring 7.2 cm concerning for empyema or pulmonary abscess. On coronal imaging, this extends over a craniocaudal length of 10 cm. Dependent atelectasis in the left lower lobe. Upper Abdomen: Changes of cirrhosis with ascites. Musculoskeletal: No acute bony abnormality. Chest wall soft tissues are unremarkable. IMPRESSION: Dense consolidation in the right lower lobe compatible with pneumonia. Loculated right pleural fluid. Rounded fluid collection posteriorly in the right lower hemithorax which could be loculated fluid and reflect empyema or pulmonary abscess. Coronary artery disease. Cirrhosis, ascites. Electronically Signed   By: Charlett Nose M.D.   On: 01/21/2019 20:28   Ct Abdomen Pelvis W Contrast  Result Date: 01/21/2019 CLINICAL DATA:  Coughing since January. History of thrombocytopenia, splenomegaly and cirrhosis.  Recently diagnosed with pneumonia. EXAM: CT ABDOMEN AND PELVIS WITH CONTRAST TECHNIQUE: Multidetector CT imaging of the abdomen and pelvis was performed using the standard protocol following bolus administration of intravenous contrast. CONTRAST:  OMNIPAQUE IOHEXOL 300 MG/ML  SOLN COMPARISON:  Chest radiographs today. Abdominopelvic CT 05/08/2016. Right upper quadrant ultrasound 06/30/2018. FINDINGS: Lower chest: The lung bases are incompletely visualized. There is consolidation of the visualized right lower lobe with a large loculated right pleural effusion. This has a component posteriorly with thick peripheral enhancement, measuring up to 7.9 x 5.2 cm on image 9/2. Based on the provided history, this is suspicious for empyema and possible pleural-based abscess. There is minimal atelectasis at the left lung base and no significant left pleural effusion. The azygos and hemi azygous veins are mildly dilated. Hepatobiliary: There are progressive changes of cirrhosis throughout the liver with diffuse contour irregularity and parenchymal heterogeneity. No dominant mass or focally abnormal enhancement identified. The portal vein is patent. No evidence of gallstones, gallbladder wall thickening or biliary dilatation. Pancreas: Unremarkable. No pancreatic ductal dilatation or surrounding inflammatory changes. Spleen: Measures approximately 14.7 x 7.5 x 12.3 cm (volume = 710 cm^3) consistent with mild splenomegaly. No focal abnormality. Adrenals/Urinary Tract: Both adrenal glands appear normal. The kidneys, ureters and bladder appear normal. Stomach/Bowel: No evidence of bowel wall thickening, distention or surrounding inflammatory change. There is a large amount of stool within the rectum. There is a probable loop of small bowel extending into an umbilical hernia. No evidence of bowel obstruction or incarceration. Vascular/Lymphatic: There are no enlarged abdominal or pelvic lymph nodes. Minimal aortic  atherosclerosis. No acute vascular findings are identified. 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 The portal, superior mesenteric and splenic veins appear patent. Reproductive: The prostate gland and seminal vesicles appear normal. Other: Interval development of a moderate amount of ascites. Fluid extends into an umbilical hernia which measures up to 8.6 cm transverse on image 73/2. As above, there is air within this hernia as well which appears to be due to a small knuckle of small bowel. Musculoskeletal: No acute osseous findings. There are old rib fractures on the left. There are changes of bilateral femoral head avascular  necrosis with early subchondral collapse on the right. There is edema throughout the subcutaneous fat. In the upper right buttocks, there is a globular area of increased signal which could be related to prior trauma/injection or reflect fat necrosis. IMPRESSION: 1. Right lower lobe pulmonary consolidation with complex fluid in the right pleural space, worrisome for empyema and pleural-based abscess. Full chest CT may be helpful for further evaluation. 2. Significantly progressive changes of diffuse hepatic cirrhosis compared with previous CT from 2017. Mild changes of portal hypertension with splenomegaly and venous collaterals. The main portal vein appears patent. 3. Progressive moderate volume ascites. Ascites and a small bowel loop extend into an umbilical hernia. No evidence of bowel obstruction or incarceration. 4. Chronic bilateral femoral head avascular necrosis. Electronically Signed   By: Carey Bullocks M.D.   On: 01/21/2019 19:37   US Renal  Result Date: 01/23/2019 CLINICAL DATA:  Renal failure EXAM: RENAL / URINARY TRACT ULTRASOUND COMPLETE COMPARISON:  None. FINDINGS: Right Kidney: Renal measurements: 12.4 x 6.8 x 6.4 cm = volume: 286 mL . Echogenicity within normal limits. No mass or hydronephrosis visualized. Left Kidney: Renal measurements:  11.8 x 6 x 6.5 cm = volume: 239 mL. Echogenicity within normal limits. No mass or hydronephrosis visualized. Bladder: Appears normal for degree of bladder distention. Other: Increased hepatic echogenicity with a slightly nodular contour as can be seen with hepatic cirrhosis. Small amount of perihepatic ascites. Splenomegaly with the spleen measuring 18.5 cm in length and a volume of 1535 mL. IMPRESSION: 1. Normal renal ultrasound. 2. Increased hepatic echogenicity with a slightly nodular contour as can be seen with hepatic cirrhosis. Small volume perihepatic ascites. Splenomegaly. Correlate with liver function test. Electronically Signed   By: Elige Ko   On: 01/23/2019 10:06   Dg Chest Portable 1 View  Result Date: 01/21/2019 CLINICAL DATA:  38 year old with current history of hepatic cirrhosis and, by the patient's report, recent diagnosis of pneumonia at an outside facility. EXAM: PORTABLE CHEST 1 VIEW COMPARISON:  02/21/2018 and earlier, including CTA chest 05/13/2015. FINDINGS: Cardiac silhouette moderately to markedly enlarged, unchanged since 2019 but increased in size since 2016. Pulmonary venous hypertension with perhaps minimal interstitial pulmonary edema. Large RIGHT pleural effusion and associated consolidation in the RIGHT LOWER LOBE. Lungs otherwise clear. No visible LEFT pleural effusion. IMPRESSION: 1. Large RIGHT pleural effusion and associated passive atelectasis and/or pneumonia involving the RIGHT LOWER LOBE. 2. Stable moderate to marked cardiomegaly. Pulmonary venous hypertension with perhaps minimal interstitial pulmonary edema indicating CHF and/or fluid overload. Electronically Signed   By: Hulan Saas M.D.   On: 01/21/2019 16:25   Ir Paracentesis  Result Date: 01/23/2019 INDICATION: Patient with history of alcoholic cirrhosis with recurrent ascites. Request is made for diagnostic and therapeutic paracentesis. EXAM: ULTRASOUND GUIDED DIAGNOSTIC AND THERAPEUTIC PARACENTESIS  MEDICATIONS: 10 mL 1% lidocaine COMPLICATIONS: None immediate. PROCEDURE: Informed written consent was obtained from the patient after a discussion of the risks, benefits and alternatives to treatment. A timeout was performed prior to the initiation of the procedure. Initial ultrasound scanning demonstrates a moderate amount of ascites within the right lower abdominal quadrant. The right lower abdomen was prepped and draped in the usual sterile fashion. 1% lidocaine was used for local anesthesia. Following this, a 19 gauge, 7-cm, Yueh catheter was introduced. An ultrasound image was saved for documentation purposes. The paracentesis was performed. The catheter was removed and a dressing was applied. The patient tolerated the procedure well without immediate post procedural complication. FINDINGS: A total  of approximately 2.2 L of clear yellow fluid was removed. Samples were sent to the laboratory as requested by the clinical team. IMPRESSION: Successful ultrasound-guided paracentesis yielding 2.2 L of peritoneal fluid. Read by: Elwin Mocha, PA-C Electronically Signed   By: Richarda Overlie M.D.   On: 01/23/2019 15:46    Labs:  CBC: Recent Labs    01/22/19 0049 01/23/19 0627 01/24/19 0447 01/25/19 0556  WBC 15.6* 29.9* 16.1* 14.0*  HGB 10.8* 10.1* 8.4* 7.5*  HCT 30.3* 28.6* 24.1* 21.9*  PLT 145* 125* 99* 85*    COAGS: Recent Labs    01/23/19 0627 01/24/19 0447 01/24/19 1424 01/25/19 0556  INR 3.3* 2.7* 2.2* 1.9*    BMP: Recent Labs    01/22/19 0049 01/23/19 0627 01/24/19 0447 01/25/19 0556  NA 122* 124* 124* 129*  K 3.4* 4.2 4.9 4.4  CL 89* 93* 96* 101  CO2 20* 21* 17* 17*  GLUCOSE 104* 110* 87 103*  BUN 5* 9 16 21*  CALCIUM 8.0* 8.0* 8.6* 9.3  CREATININE 0.64 1.76* 1.47* 1.37*  GFRNONAA >60 48* >60 >60  GFRAA >60 56* >60 >60    LIVER FUNCTION TESTS: Recent Labs    01/22/19 0049 01/23/19 0627 01/24/19 0447 01/25/19 0556  BILITOT 3.1* 4.7* 6.7* 6.9*  AST 129* 124*  80* 56*  ALT 40 40 30 24  ALKPHOS 154* 162* 105 94  PROT 7.9 7.5 7.3 7.4  ALBUMIN 1.7* 1.7* 2.3* 2.8*    TUMOR MARKERS: No results for input(s): AFPTM, CEA, CA199, CHROMGRNA in the last 8760 hours.  Assessment and Plan: Empyema vs. Lung abscess Patient with history of cirrhosis, portal vein thrombosis recently started on Xarelto as an outpatient presented to [facility] ED with shortness of breath.  Patient underwent paracentesis yesterday with 2.2 L removed.  He remains short of breath with persistent fevers despite antibiotics. He has been evaluated by Dr. Dorris Fetch who recommends percutaneous drainage of empyema vs. Lung abscess.  Patient INR is 1.9 today.  Ok to proceed per Dr. Lowella Dandy.  He has been NPO as of this AM.   Risks and benefits discussed with the patient including bleeding, infection, damage to adjacent structures, and sepsis.  All of the patient's questions were answered, patient is agreeable to proceed. Consent signed and in chart.  Thank you for this interesting consult.  I greatly enjoyed meeting Grant [name] and look forward to participating in their care.  A copy of this report was sent to the requesting provider on this date.  Electronically Signed: Hoyt Koch, PA 01/25/2019, 2:27 PM   I spent a total of 40 Minutes    in face to face in clinical consultation, greater than 50% of which was counseling/coordinating care for empyema.

## 2019-01-25 NOTE — Progress Notes (Signed)
Spoke with pt's nurse regarding 5:49am po entry in chart. Nurse states she was not here at this time so no sure if pt drank anything. Pt was asked but not sure if he did drink anything after midnight. Will bring pt down for drain after 12pm for 6hr NPO status due to sedation

## 2019-01-25 NOTE — Progress Notes (Signed)
Patient Demographics:    Grant [name], is a 38 y.o. male, DOB - 26-May-1981, ZOX:[id number]  Admit date - 01/21/2019   Admitting Physician Levie Heritage, DO  Outpatient Primary MD for the patient is Patient, No Pcp Per  LOS - 4   Chief Complaint  Patient presents with   Shortness of Breath        Subjective:    Grant [name] today has no fevers, no emesis,  No chest pain, resting comfortably  Assessment  & Plan :    Principal Problem:   Acute respiratory failure with hypoxia (HCC) Active Problems:   Alcoholic cirrhosis of liver with ascites (HCC)   Esophageal reflux   Morbid obesity due to excess calories (HCC)   Hypokalemia   Ascites   RLL pneumonia (HCC)   Empyema of right pleural space (HCC)   Hyponatremia   Supratherapeutic INR   Lactic acidosis  Brief Summary 38 year old male with history of alcoholic cirrhosis, portal vein thrombosis diagnosed in 06/2018 for which he is currently on Xarelto, severe microcytic anemia, GERD, splenomegaly presented with shortness of breath and cough worsening over the past 3 weeks, with COVID-19 testing done as an outpatient 3 weeks PTA apparently negative, repeat COVID test on 01/21/2019 at our facility was also negative. Admitted on 01/21/2027 right lower lobe pneumonia with loculated pleural fluid, likely empyema Versus Abscess.  He was also found to have elevated INR along with hyponatremia.  CT surgery was consulted and recommends percutaneous drainage by interventional radiology  A/p 1)Rt LL pneumonia with loculated pleural fluid, likely empyema Versus Abscess.----Plan is for percutaneous drainage by IR on 01/25/2019, vancomycin discontinued on 01/23/2019, continue cefepime, strep pneumo antigen negative  2)SBP--- paracentesis fluid from 01/23/2019 with 258 ANC consistent with SBP, continue cefepime as above, paracentesis fluid NGTD  3) supratherapeutic  INR--- liver disease with some degree of auto anticoagulation--- PTA was on Xarelto for portal vein thrombosis, patient received vitamin K, INR trending  4)Alcoholic liver cirrhosis with ascites--- status post paracentesis on 01/23/2019--- denies recent alcohol use, currently on Lasix, consider restarting Aldactone if renal function continues to improve..... Continue folic acid and MVI.   5)FEN/hypokalemia/hypomagnesemia//hyponatremia--- continue to replace potassium and magnesium, low sodium is probably from hypervolemic state/hemodilution, continue with IV Lasix and fluid restriction  6)AKI--- possibly contrast induced ATN, nephrology consult appreciated,, ultrasound without significant abnormalities...., Creatinine baseline was 0.6, creatinine peak was 1.76, creatinine is now down to 1.37... Continue to avoid nephrotoxic agents  7)Chronic Thrombocytopenia/chronic Anemia-----most likely due to underlying liver disease/cirrhosis, no evidence of ongoing bleeding, continue to monitor--no indication for transfusion at this time  Disposition/Need for in-Hospital Stay- patient unable to be discharged at this time due to right lung empyema versus abscess requiring percutaneous drainage and IV antibiotics, as well as AKI and electrolyte abnormalities requiring IV replacement  Code Status : full  Family Communication:   na   Disposition Plan  : Possibly home when medically improved  Consults  : CT surgery/IR  DVT Prophylaxis  :  - SCDs/Xarelto on hold  Lab Results  Component Value Date   PLT 85 (L) 01/25/2019    Inpatient Medications  Scheduled Meds:  folic acid  1 mg Oral Daily   furosemide  40 mg Intravenous  TID   magnesium oxide  400 mg Oral Daily   mouth rinse  15 mL Mouth Rinse BID   multivitamin with minerals  1 tablet Oral Daily   pantoprazole  40 mg Oral Daily   potassium chloride  40 mEq Oral BID   thiamine  100 mg Oral Daily   Continuous Infusions:  albumin human  25 g (01/25/19 0242)   ceFEPime (MAXIPIME) IV 2 g (01/25/19 1013)   PRN Meds:.benzonatate, guaiFENesin-dextromethorphan    Anti-infectives (From admission, onward)   Start     Dose/Rate Route Frequency Ordered Stop   01/22/19 1000  ceFEPIme (MAXIPIME) 2 g in sodium chloride 0.9 % 100 mL IVPB     2 g 200 mL/hr over 30 Minutes Intravenous Every 8 hours 01/22/19 0904     01/22/19 0400  vancomycin (VANCOCIN) IVPB 1000 mg/200 mL premix  Status:  Discontinued     1,000 mg 200 mL/hr over 60 Minutes Intravenous Every 12 hours 01/21/19 1820 01/23/19 0855   01/21/19 1800  vancomycin (VANCOCIN) IVPB 1000 mg/200 mL premix     1,000 mg 200 mL/hr over 60 Minutes Intravenous Every 1 hr x 2 01/21/19 1703 01/21/19 2042   01/21/19 1645  ceFEPIme (MAXIPIME) 1 g in sodium chloride 0.9 % 100 mL IVPB     1 g 200 mL/hr over 30 Minutes Intravenous  Once 01/21/19 1633 01/21/19 1802        Objective:   Vitals:   01/24/19 2316 01/25/19 0324 01/25/19 0747 01/25/19 1206  BP: 107/62  (!) 122/56 103/60  Pulse: (!) 103  (!) 101 99  Resp:   18 18  Temp: 98.1 F (36.7 C)  98.4 F (36.9 C) 98.3 F (36.8 C)  TempSrc: Oral  Oral Oral  SpO2: 95%  94% 96%  Weight:  99 kg    Height:        Wt Readings from Last 3 Encounters:  01/25/19 99 kg  11/04/18 100.5 kg  10/11/18 100.7 kg     Intake/Output Summary (Last 24 hours) at 01/25/2019 1216 Last data filed at 01/25/2019 0549 Gross per 24 hour  Intake 665.91 ml  Output --  Net 665.91 ml     Physical Exam Patient is examined daily including today on 01/25/19 , exams remain the same as of yesterday except that has changed   Gen:- Awake Alert, in no acute distress HEENT:- [place].AT, No sclera icterus Neck-Supple Neck,No JVD,.  Lungs-diminished on right with scattered rhonchi CV- S1, S2 normal, regular  Abd-umbilical hernia, ascites, abdominal distention, no significant tenderness Extremity/Skin:- 2 +ve edema, pedal pulses present  Psych-affect is  appropriate, oriented x3 Neuro-no new focal deficits, no tremors   Data Review:   Micro Results Recent Results (from the past 240 hour(s))  SARS Coronavirus 2 [facility] order, Performed in [facility] hospital lab)     Status: None   Collection Time: 01/21/19  3:58 PM  Result Value Ref Range Status   SARS Coronavirus 2 NEGATIVE NEGATIVE Final    Comment: (NOTE) If result is NEGATIVE SARS-CoV-2 target nucleic acids are NOT DETECTED. The SARS-CoV-2 RNA is generally detectable in upper and lower  respiratory specimens during the acute phase of infection. The lowest  concentration of SARS-CoV-2 viral copies this assay can detect is 250  copies / mL. A negative result does not preclude SARS-CoV-2 infection  and should not be used as the sole basis for treatment or other  patient management decisions.  A negative result may occur with  improper specimen collection / handling, submission of specimen other  than nasopharyngeal swab, presence of viral mutation(s) within the  areas targeted by this assay, and inadequate number of viral copies  (<250 copies / mL). A negative result must be combined with clinical  observations, patient history, and epidemiological information. If result is POSITIVE SARS-CoV-2 target nucleic acids are DETECTED. The SARS-CoV-2 RNA is generally detectable in upper and lower  respiratory specimens dur ing the acute phase of infection.  Positive  results are indicative of active infection with SARS-CoV-2.  Clinical  correlation with patient history and other diagnostic information is  necessary to determine patient infection status.  Positive results do  not rule out bacterial infection or co-infection with other viruses. If result is PRESUMPTIVE POSTIVE SARS-CoV-2 nucleic acids MAY BE PRESENT.   A presumptive positive result was obtained on the submitted specimen  and confirmed on repeat testing.  While 2019 novel coronavirus  (SARS-CoV-2) nucleic acids may be  present in the submitted sample  additional confirmatory testing may be necessary for epidemiological  and / or clinical management purposes  to differentiate between  SARS-CoV-2 and other Sarbecovirus currently known to infect humans.  If clinically indicated additional testing with an alternate test  methodology [phone]([id number]) is advised. The SARS-CoV-2 RNA is generally  detectable in upper and lower respiratory sp ecimens during the acute  phase of infection. The expected result is Negative. Fact Sheet for Patients:  BoilerBrush.com.cyhttps://www.fda.gov/media/136312/download Fact Sheet for Healthcare Providers: https://pope.com/https://www.fda.gov/media/136313/download This test is not yet approved or cleared by the [place] FDA and has been authorized for detection and/or diagnosis of SARS-CoV-2 by FDA under an Emergency Use Authorization (EUA).  This EUA will remain in effect (meaning this test can be used) for the duration of the COVID-19 declaration under Section 564(b)(1) of the Act, 21 U.S.C. section 360bbb-3(b)(1), unless the authorization is terminated or revoked sooner. Performed at [facility], [address] [place], [place] [address]   Culture, blood (Routine X 2) w Reflex to ID Panel     Status: None (Preliminary result)   Collection Time: 01/21/19  4:53 PM  Result Value Ref Range Status   Specimen Description   Final    LEFT ANTECUBITAL BOTTLES DRAWN AEROBIC AND ANAEROBIC   Special Requests Blood Culture adequate volume  Final   Culture   Final    NO GROWTH 4 DAYS Performed at [facility], [address] [place], [place] [address]    Report Status PENDING  Incomplete  Culture, blood (Routine X 2) w Reflex to ID Panel     Status: None (Preliminary result)   Collection Time: 01/21/19  4:54 PM  Result Value Ref Range Status   Specimen Description BLOOD LEFT ARM BOTTLES DRAWN AEROBIC ONLY  Final   Special Requests Blood Culture adequate volume  Final   Culture   Final    NO GROWTH 4  DAYS Performed at [facility], [address] [place], [place] [address]    Report Status PENDING  Incomplete  Urine Culture     Status: None   Collection Time: 01/21/19  5:31 PM  Result Value Ref Range Status   Specimen Description   Final    URINE, CLEAN CATCH Performed at [facility], [address] [place], [place] [address]    Special Requests   Final    NONE Performed at [facility], [address] [place], [place] [address]    Culture   Final    NO GROWTH Performed  at [facility], [address], [place], [place] [address]    Report Status 01/23/2019 FINAL  Final  MRSA PCR Screening     Status: None   Collection Time: 01/22/19  1:00 AM  Result Value Ref Range Status   MRSA by PCR NEGATIVE NEGATIVE Final    Comment:        The GeneXpert MRSA Assay (FDA approved for NASAL specimens only), is one component of a comprehensive MRSA colonization surveillance program. It is not intended to diagnose MRSA infection nor to guide or monitor treatment for MRSA infections. Performed at [facility], [address] [place], [place] [address]   Culture, sputum-assessment     Status: None   Collection Time: 01/22/19  5:02 AM  Result Value Ref Range Status   Specimen Description SPUTUM  Final   Special Requests Normal  Final   Sputum evaluation   Final    THIS SPECIMEN IS ACCEPTABLE FOR SPUTUM CULTURE Performed at [facility], [address], [place], [place] [address]    Report Status 01/22/2019 FINAL  Final  Culture, respiratory     Status: None   Collection Time: 01/22/19  5:02 AM  Result Value Ref Range Status   Specimen Description SPUTUM  Final   Special Requests Normal Reflexed from [id number]  Final   Gram Stain   Final    MODERATE WBC PRESENT, PREDOMINANTLY PMN FEW GRAM NEGATIVE RODS RARE GRAM POSITIVE COCCI IN PAIRS    Culture   Final    Consistent with normal respiratory flora. Performed at [facility], [address]  [place], [place] [address]    Report Status 01/24/2019 FINAL  Final  Gram stain     Status: None   Collection Time: 01/23/19  3:18 PM  Result Value Ref Range Status   Specimen Description FLUID PERITONEAL  Final   Special Requests NONE  Final   Gram Stain   Final    RARE WBC PRESENT,BOTH PMN AND MONONUCLEAR NO ORGANISMS SEEN Performed at [facility], [address] [place], [place] [address]    Report Status 01/23/2019 FINAL  Final  Culture, body fluid-bottle     Status: None (Preliminary result)   Collection Time: 01/23/19  3:18 PM  Result Value Ref Range Status   Specimen Description FLUID PERITONEAL  Final   Special Requests BOTTLES DRAWN AEROBIC AND ANAEROBIC 10CC  Final   Culture   Final    NO GROWTH < 24 HOURS Performed at [facility], [address] [place], [place] [address]    Report Status PENDING  Incomplete    Radiology Reports Ct Chest Wo Contrast  Result Date: 01/21/2019 CLINICAL DATA:  Right lower lobe opacity and complex pleural fluid seen on prior abdominal CT. EXAM: CT CHEST WITHOUT CONTRAST TECHNIQUE: Multidetector CT imaging of the chest was performed following the standard protocol without IV contrast. COMPARISON:  CT abdomen 01/21/2019 FINDINGS: Cardiovascular: Heart is normal size. Aorta is normal caliber. Coronary artery calcification in the left anterior descending coronary artery. Mediastinum/Nodes: No mediastinal, hilar, or axillary adenopathy. Lungs/Pleura: Dense consolidation noted in the right lower lobe compatible with pneumonia. Loculated pleural fluid noted along the right heart border and superior laterally in the right hemithorax. There is a rounded fluid collection posteriorly in the right lower hemithorax measuring 7.2 cm concerning for empyema or pulmonary abscess. On coronal imaging, this extends over a craniocaudal length of 10 cm. Dependent atelectasis in  the left lower lobe. Upper Abdomen: Changes of cirrhosis with ascites.  Musculoskeletal: No acute bony abnormality. Chest wall soft tissues are unremarkable. IMPRESSION: Dense consolidation in the right lower lobe compatible with pneumonia. Loculated right pleural fluid. Rounded fluid collection posteriorly in the right lower hemithorax which could be loculated fluid and reflect empyema or pulmonary abscess. Coronary artery disease. Cirrhosis, ascites. Electronically Signed   By: Charlett Nose M.D.   On: 01/21/2019 20:28   Ct Abdomen Pelvis W Contrast  Result Date: 01/21/2019 CLINICAL DATA:  Coughing since January. History of thrombocytopenia, splenomegaly and cirrhosis. Recently diagnosed with pneumonia. EXAM: CT ABDOMEN AND PELVIS WITH CONTRAST TECHNIQUE: Multidetector CT imaging of the abdomen and pelvis was performed using the standard protocol following bolus administration of intravenous contrast. CONTRAST:  OMNIPAQUE IOHEXOL 300 MG/ML  SOLN COMPARISON:  Chest radiographs today. Abdominopelvic CT 05/08/2016. Right upper quadrant ultrasound 06/30/2018. FINDINGS: Lower chest: The lung bases are incompletely visualized. There is consolidation of the visualized right lower lobe with a large loculated right pleural effusion. This has a component posteriorly with thick peripheral enhancement, measuring up to 7.9 x 5.2 cm on image 9/2. Based on the provided history, this is suspicious for empyema and possible pleural-based abscess. There is minimal atelectasis at the left lung base and no significant left pleural effusion. The azygos and hemi azygous veins are mildly dilated. Hepatobiliary: There are progressive changes of cirrhosis throughout the liver with diffuse contour irregularity and parenchymal heterogeneity. No dominant mass or focally abnormal enhancement identified. The portal vein is patent. No evidence of gallstones, gallbladder wall thickening or biliary dilatation. Pancreas: Unremarkable. No pancreatic ductal dilatation or surrounding inflammatory changes. Spleen:  Measures approximately 14.7 x 7.5 x 12.3 cm (volume = 710 cm^3) consistent with mild splenomegaly. No focal abnormality. Adrenals/Urinary Tract: Both adrenal glands appear normal. The kidneys, ureters and bladder appear normal. Stomach/Bowel: No evidence of bowel wall thickening, distention or surrounding inflammatory change. There is a large amount of stool within the rectum. There is a probable loop of small bowel extending into an umbilical hernia. No evidence of bowel obstruction or incarceration. Vascular/Lymphatic: There are no enlarged abdominal or pelvic lymph nodes. Minimal aortic atherosclerosis. No acute vascular findings are identified. 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 The portal, superior mesenteric and splenic veins appear patent. Reproductive: The prostate gland and seminal vesicles appear normal. Other: Interval development of a moderate amount of ascites. Fluid extends into an umbilical hernia which measures up to 8.6 cm transverse on image 73/2. As above, there is air within this hernia as well which appears to be due to a small knuckle of small bowel. Musculoskeletal: No acute osseous findings. There are old rib fractures on the left. There are changes of bilateral femoral head avascular necrosis with early subchondral collapse on the right. There is edema throughout the subcutaneous fat. In the upper right buttocks, there is a globular area of increased signal which could be related to prior trauma/injection or reflect fat necrosis. IMPRESSION: 1. Right lower lobe pulmonary consolidation with complex fluid in the right pleural space, worrisome for empyema and pleural-based abscess. Full chest CT may be helpful for further evaluation. 2. Significantly progressive changes of diffuse hepatic cirrhosis compared with previous CT from 2017. Mild changes of portal hypertension with splenomegaly and venous collaterals. The main portal vein appears patent. 3.  Progressive moderate volume ascites. Ascites and a small bowel loop extend into an umbilical hernia. No evidence of bowel obstruction or  incarceration. 4. Chronic bilateral femoral head avascular necrosis. Electronically Signed   By: Carey Bullocks M.D.   On: 01/21/2019 19:37   US Renal  Result Date: 01/23/2019 CLINICAL DATA:  Renal failure EXAM: RENAL / URINARY TRACT ULTRASOUND COMPLETE COMPARISON:  None. FINDINGS: Right Kidney: Renal measurements: 12.4 x 6.8 x 6.4 cm = volume: 286 mL . Echogenicity within normal limits. No mass or hydronephrosis visualized. Left Kidney: Renal measurements: 11.8 x 6 x 6.5 cm = volume: 239 mL. Echogenicity within normal limits. No mass or hydronephrosis visualized. Bladder: Appears normal for degree of bladder distention. Other: Increased hepatic echogenicity with a slightly nodular contour as can be seen with hepatic cirrhosis. Small amount of perihepatic ascites. Splenomegaly with the spleen measuring 18.5 cm in length and a volume of 1535 mL. IMPRESSION: 1. Normal renal ultrasound. 2. Increased hepatic echogenicity with a slightly nodular contour as can be seen with hepatic cirrhosis. Small volume perihepatic ascites. Splenomegaly. Correlate with liver function test. Electronically Signed   By: Elige Ko   On: 01/23/2019 10:06   Dg Chest Portable 1 View  Result Date: 01/21/2019 CLINICAL DATA:  38 year old with current history of hepatic cirrhosis and, by the patient's report, recent diagnosis of pneumonia at an outside facility. EXAM: PORTABLE CHEST 1 VIEW COMPARISON:  02/21/2018 and earlier, including CTA chest 05/13/2015. FINDINGS: Cardiac silhouette moderately to markedly enlarged, unchanged since 2019 but increased in size since 2016. Pulmonary venous hypertension with perhaps minimal interstitial pulmonary edema. Large RIGHT pleural effusion and associated consolidation in the RIGHT LOWER LOBE. Lungs otherwise clear. No visible LEFT pleural effusion. IMPRESSION:  1. Large RIGHT pleural effusion and associated passive atelectasis and/or pneumonia involving the RIGHT LOWER LOBE. 2. Stable moderate to marked cardiomegaly. Pulmonary venous hypertension with perhaps minimal interstitial pulmonary edema indicating CHF and/or fluid overload. Electronically Signed   By: Hulan Saas M.D.   On: 01/21/2019 16:25   Ir Paracentesis  Result Date: 01/23/2019 INDICATION: Patient with history of alcoholic cirrhosis with recurrent ascites. Request is made for diagnostic and therapeutic paracentesis. EXAM: ULTRASOUND GUIDED DIAGNOSTIC AND THERAPEUTIC PARACENTESIS MEDICATIONS: 10 mL 1% lidocaine COMPLICATIONS: None immediate. PROCEDURE: Informed written consent was obtained from the patient after a discussion of the risks, benefits and alternatives to treatment. A timeout was performed prior to the initiation of the procedure. Initial ultrasound scanning demonstrates a moderate amount of ascites within the right lower abdominal quadrant. The right lower abdomen was prepped and draped in the usual sterile fashion. 1% lidocaine was used for local anesthesia. Following this, a 19 gauge, 7-cm, Yueh catheter was introduced. An ultrasound image was saved for documentation purposes. The paracentesis was performed. The catheter was removed and a dressing was applied. The patient tolerated the procedure well without immediate post procedural complication. FINDINGS: A total of approximately 2.2 L of clear yellow fluid was removed. Samples were sent to the laboratory as requested by the clinical team. IMPRESSION: Successful ultrasound-guided paracentesis yielding 2.2 L of peritoneal fluid. Read by: Elwin Mocha, PA-C Electronically Signed   By: Richarda Overlie M.D.   On: 01/23/2019 15:46     CBC Recent Labs  Lab 01/21/19 1536 01/22/19 0049 01/23/19 0627 01/24/19 0447 01/25/19 0556  WBC 17.0* 15.6* 29.9* 16.1* 14.0*  HGB 11.8* 10.8* 10.1* 8.4* 7.5*  HCT 33.2* 30.3* 28.6* 24.1* 21.9*   PLT 171 145* 125* 99* 85*  MCV 96.0 94.7 97.9 97.6 99.5  MCH 34.1* 33.8 34.6* 34.0 34.1*  MCHC 35.5 35.6 35.3 34.9 34.2  RDW  14.0 13.8 14.7 14.9 15.6*  LYMPHSABS 1.5  --  1.3 0.5*  --   MONOABS 1.3*  --  1.6* 0.7  --   EOSABS 0.1  --  0.1 0.1  --   BASOSABS 0.1  --  0.1 0.1  --     Chemistries  Recent Labs  Lab 01/21/19 1536 01/22/19 0049 01/23/19 0627 01/24/19 0447 01/25/19 0556  NA 121* 122* 124* 124* 129*  K 3.4* 3.4* 4.2 4.9 4.4  CL 88* 89* 93* 96* 101  CO2 22 20* 21* 17* 17*  GLUCOSE 117* 104* 110* 87 103*  BUN 7 5* 9 16 21*  CREATININE 0.60* 0.64 1.76* 1.47* 1.37*  CALCIUM 7.9* 8.0* 8.0* 8.6* 9.3  MG  --   --  1.1* 2.0  --   AST 127* 129* 124* 80* 56*  ALT 41 40 40 30 24  ALKPHOS 170* 154* 162* 105 94  BILITOT 2.9* 3.1* 4.7* 6.7* 6.9*   ------------------------------------------------------------------------------------------------------------------ No results for input(s): CHOL, HDL, LDLCALC, TRIG, CHOLHDL, LDLDIRECT in the last 72 hours.  No results found for: HGBA1C ------------------------------------------------------------------------------------------------------------------ No results for input(s): TSH, T4TOTAL, T3FREE, THYROIDAB in the last 72 hours.  Invalid input(s): FREET3 ------------------------------------------------------------------------------------------------------------------ No results for input(s): VITAMINB12, FOLATE, FERRITIN, TIBC, IRON, RETICCTPCT in the last 72 hours.  Coagulation profile Recent Labs  Lab 01/22/19 0049 01/23/19 0627 01/24/19 0447 01/24/19 1424 01/25/19 0556  INR 5.2* 3.3* 2.7* 2.2* 1.9*    No results for input(s): DDIMER in the last 72 hours.  Cardiac Enzymes No results for input(s): CKMB, TROPONINI, MYOGLOBIN in the last 168 hours.  Invalid input(s): CK ------------------------------------------------------------------------------------------------------------------    Component Value Date/Time   BNP  318.0 (H) 01/21/2019 1648     Shon Hale M.D on 01/25/2019 at 12:16 PM  Go to [email or web address] - for contact info  [facility] - Office  [phone]

## 2019-01-25 NOTE — Progress Notes (Signed)
  Subjective: Just back from drain placement C/o discomfort at site, difficult to find a comfortable position  Objective: Vital signs in last 24 hours: Temp:  [97.8 F (36.6 C)-98.4 F (36.9 C)] 97.8 F (36.6 C) (04/29 1639) Pulse Rate:  [99-114] 109 (04/29 1639) Cardiac Rhythm: Sinus tachycardia (04/29 1630) Resp:  [18-25] 18 (04/29 1639) BP: (95-122)/(56-73) 114/69 (04/29 1639) SpO2:  [93 %-96 %] 96 % (04/29 1639) Weight:  [99 kg] 99 kg (04/29 0324)  Hemodynamic parameters for last 24 hours:    Intake/Output from previous day: 04/28 0701 - 04/29 0700 In: 905.9 [P.O.:480; IV Piggyback:425.9] Out: -  Intake/Output this shift: Total I/O In: -  Out: 400 [Urine:300; Chest Tube:100]  General appearance: alert, cooperative and no distress minimal bloody drainage from pleuravac  Lab Results: Recent Labs    01/24/19 0447 01/25/19 0556  WBC 16.1* 14.0*  HGB 8.4* 7.5*  HCT 24.1* 21.9*  PLT 99* 85*   BMET:  Recent Labs    01/24/19 0447 01/25/19 0556  NA 124* 129*  K 4.9 4.4  CL 96* 101  CO2 17* 17*  GLUCOSE 87 103*  BUN 16 21*  CREATININE 1.47* 1.37*  CALCIUM 8.6* 9.3    PT/INR:  Recent Labs    01/25/19 0556  LABPROT 21.8*  INR 1.9*   ABG No results found for: PHART, HCO3, TCO2, ACIDBASEDEF, O2SAT CBG (last 3)  Recent Labs    01/25/19 1130  GLUCAP 80    Assessment/Plan:  Pneumonia with probable lung abscess Drain placed by IR today Cultures pending On cefepime   LOS: 4 days    Loreli Slot 01/25/2019

## 2019-01-25 NOTE — Procedures (Signed)
Interventional Radiology Procedure:   Indications: Right lung abscess/ empyema  Procedure: CT guided chest drain placement  Findings: Thick wall collection in right posterior lower chest.  Thick bloody purulent fluid aspirated, approximately 100 ml.  12 Fr drain placed.  Thick bloody fluid draining at end of procedure.   Complications: None     EBL: Less than 10 ml  Plan: Right chest drain to PleurEvac and wall suction.  May need to flush drain if it occludes.     Yaeko Fazekas R. Lowella Dandy, MD  Pager: [phone]

## 2019-01-25 NOTE — Progress Notes (Signed)
MD notified of pt poc gbs and that radiology not planning to take pt before 2pm.

## 2019-01-26 ENCOUNTER — Inpatient Hospital Stay (HOSPITAL_COMMUNITY): Payer: Medicaid - Out of State

## 2019-01-26 LAB — COMPREHENSIVE METABOLIC PANEL
ALT: 26 U/L (ref 0–44)
AST: 61 U/L — ABNORMAL HIGH (ref 15–41)
Albumin: 2.9 g/dL — ABNORMAL LOW (ref 3.5–5.0)
Alkaline Phosphatase: 106 U/L (ref 38–126)
Anion gap: 11 (ref 5–15)
BUN: 20 mg/dL (ref 6–20)
CO2: 18 mmol/L — ABNORMAL LOW (ref 22–32)
Calcium: 9.8 mg/dL (ref 8.9–10.3)
Chloride: 101 mmol/L (ref 98–111)
Creatinine, Ser: 1.21 mg/dL (ref 0.61–1.24)
GFR calc Af Amer: >60 mL/min (ref >60)
GFR calc non Af Amer: >60 mL/min (ref >60)
Glucose, Bld: 109 mg/dL — ABNORMAL HIGH (ref 70–99)
Potassium: 4.4 mmol/L (ref 3.5–5.1)
Sodium: 130 mmol/L — ABNORMAL LOW (ref 135–145)
Total Bilirubin: 8.5 mg/dL — ABNORMAL HIGH (ref 0.3–1.2)
Total Protein: 8.6 g/dL — ABNORMAL HIGH (ref 6.5–8.1)

## 2019-01-26 LAB — PROTIME-INR
INR: 1.6 — ABNORMAL HIGH (ref 0.8–1.2)
Prothrombin Time: 18.6 seconds — ABNORMAL HIGH (ref 11.4–15.2)

## 2019-01-26 LAB — CULTURE, BLOOD (ROUTINE X 2)
Culture: NO GROWTH
Culture: NO GROWTH
Special Requests: ADEQUATE
Special Requests: ADEQUATE

## 2019-01-26 LAB — CBC
HCT: 25.7 % — ABNORMAL LOW (ref 39.0–52.0)
Hemoglobin: 8.8 g/dL — ABNORMAL LOW (ref 13.0–17.0)
MCH: 34.5 pg — ABNORMAL HIGH (ref 26.0–34.0)
MCHC: 34.2 g/dL (ref 30.0–36.0)
MCV: 100.8 fL — ABNORMAL HIGH (ref 80.0–100.0)
Platelets: 91 10*3/uL — ABNORMAL LOW (ref 150–400)
RBC: 2.55 MIL/uL — ABNORMAL LOW (ref 4.22–5.81)
RDW: 16 % — ABNORMAL HIGH (ref 11.5–15.5)
WBC: 13.2 10*3/uL — ABNORMAL HIGH (ref 4.0–10.5)
nRBC: 0 % (ref 0.0–0.2)

## 2019-01-26 MED ORDER — SPIRONOLACTONE 12.5 MG HALF TABLET
12.5000 mg | ORAL_TABLET | Freq: Every day | ORAL | Status: DC
  Administered 2019-01-26 – 2019-01-29 (×4): 12.5 mg via ORAL
  Filled 2019-01-26 (×4): qty 1

## 2019-01-26 NOTE — Progress Notes (Addendum)
Daily Nursing Note Received report from Damar, Therapist, sports. Introduced self to patient who said he had hoped to get OOB. Able to tolerate sitting on edge of bed in morning and afternoon. TED hose applied to LE. PleurEvac draining (+) sanginous output attached to (-)20 of suction --> PA Erin flushed pigtail with NS --> about 80m of serous drainage during day shift. Oral hygiene completed in early afternoon. UOP is fair --> spironolactone will be initiated this evening. Continues on cefepime for PNA. All care needs met throughout the course of the day.  DC Plan: Discharge to home when medically optimized.

## 2019-01-26 NOTE — Progress Notes (Signed)
Procedure:  Flush of Pigtail Catheter   20 ml of NS was instilled without difficulty.  Patient tolerated procedure without difficulty.  Lowella Dandy, PA-C [phone]

## 2019-01-26 NOTE — Progress Notes (Signed)
IR Rounding note via telephone. D/w pt RN S/p (R)pigtail chest tube yesterday. Feeling a little better per RN. Tube intact, site clean per RN. Output ~122mL bloody fluid. No air leak. CXR improved. Plans per CTS.  Brayton El PA-C Interventional Radiology 01/26/2019 9:48 AM

## 2019-01-26 NOTE — Progress Notes (Signed)
Patient Demographics:    Grant [name], is a 38 y.o. male, DOB - January 23, 1981, AGT:[id number]  Admit date - 01/21/2019   Admitting Physician Levie Heritage, DO  Outpatient Primary MD for the patient is Patient, No Pcp Per  LOS - 5   Chief Complaint  Patient presents with   Shortness of Breath        Subjective:    Grant [name] today has no fevers, no emesis, chest wall pain at chest tube site... Eating and drinking okay  Assessment  & Plan :    Principal Problem:   Acute respiratory failure with hypoxia (HCC) Active Problems:   Alcoholic cirrhosis of liver with ascites (HCC)   Esophageal reflux   Morbid obesity due to excess calories (HCC)   Hypokalemia   Ascites   RLL pneumonia (HCC)   Empyema of right pleural space (HCC)   Hyponatremia   Supratherapeutic INR   Lactic acidosis  Brief Summary 38 year old male with history of alcoholic cirrhosis, portal vein thrombosis diagnosed in 06/2018 for which he is currently on Xarelto, severe microcytic anemia, GERD, splenomegaly presented with shortness of breath and cough worsening over the past 3 weeks, with COVID-19 testing done as an outpatient 3 weeks PTA apparently negative, repeat COVID test on 01/21/2019 at our facility was also negative. Admitted on 01/21/2027 right lower lobe pneumonia with loculated pleural fluid, likely empyema Versus Abscess.  He was also found to have elevated INR along with hyponatremia.  CT surgery was consulted and recommends percutaneous drainage by interventional radiology, s/p Rt pleural fluid drain/chest tube placement on 01/25/2019  A/p 1)Rt LL pneumonia with loculated pleural fluid, likely empyema Versus Abscess.---- s/p  percutaneous drainage by IR on 01/25/2019, vancomycin discontinued on 01/23/2019, continue cefepime, strep pneumo antigen negative , pleural fluid Gram stain from 01/25/2019 with gram-negative rod final ID  and sensitivity pending, WBC is 13.2  2)SBP--- paracentesis fluid from 01/23/2019 with 258 ANC consistent with SBP, continue cefepime as above, paracentesis fluid cx NGTD  3)Supratherapeutic INR--- liver disease with some degree of auto anticoagulation--- PTA was on Xarelto for portal vein thrombosis, patient received vitamin K, INR 1.6  4)Alcoholic liver cirrhosis with ascites--- status post paracentesis on 01/23/2019--- denies recent alcohol use, currently on Lasix, restart Aldactone at 12.5 mg.. Continue folic acid and MVI.   5)FEN/hypokalemia/hypomagnesemia//hyponatremia--- continue to replace potassium and magnesium, low sodium is probably from hypervolemic state/hemodilution, continue with IV Lasix and fluid restriction  6)AKI--- possibly contrast induced ATN, nephrology consult appreciated,, ultrasound without significant abnormalities...., Creatinine baseline was 0.6, creatinine peak was 1.76, creatinine is now down to 1.2.Marland KitchenMarland Kitchen Continue to avoid nephrotoxic agents  7)Chronic Thrombocytopenia/chronic Anemia-----most likely due to underlying liver disease/cirrhosis, no evidence of ongoing bleeding, continue to monitor--no indication for transfusion at this time  Disposition/Need for in-Hospital Stay- patient unable to be discharged at this time due to right lung empyema versus abscess requiring percutaneous drainage and IV antibiotics pending pleural fluid culture final ID and sensitivity, as well as AKI and electrolyte abnormalities requiring IV replacement  Code Status : full  Family Communication:   na   Disposition Plan  : Possibly home when medically improved  Procedure: 01/25/19  CT guided chest drain placement Findings: Thick wall collection in right posterior  lower chest.  Thick bloody purulent fluid aspirated, approximately 100 ml.  12 Fr drain placed.  Thick bloody fluid draining at end of procedure.   Consults  : CT surgery/IR  DVT Prophylaxis  :  - SCDs/Xarelto on  hold  Lab Results  Component Value Date   PLT 85 (L) 01/25/2019    Inpatient Medications  Scheduled Meds:  folic acid  1 mg Oral Daily   furosemide  40 mg Intravenous TID   magnesium oxide  400 mg Oral Daily   mouth rinse  15 mL Mouth Rinse BID   multivitamin with minerals  1 tablet Oral Daily   pantoprazole  40 mg Oral Daily   potassium chloride  40 mEq Oral BID   thiamine  100 mg Oral Daily   Continuous Infusions:  ceFEPime (MAXIPIME) IV 2 g (01/26/19 0801)   PRN Meds:.benzonatate, guaiFENesin-dextromethorphan    Anti-infectives (From admission, onward)   Start     Dose/Rate Route Frequency Ordered Stop   01/22/19 1000  ceFEPIme (MAXIPIME) 2 g in sodium chloride 0.9 % 100 mL IVPB     2 g 200 mL/hr over 30 Minutes Intravenous Every 8 hours 01/22/19 0904     01/22/19 0400  vancomycin (VANCOCIN) IVPB 1000 mg/200 mL premix  Status:  Discontinued     1,000 mg 200 mL/hr over 60 Minutes Intravenous Every 12 hours 01/21/19 1820 01/23/19 0855   01/21/19 1800  vancomycin (VANCOCIN) IVPB 1000 mg/200 mL premix     1,000 mg 200 mL/hr over 60 Minutes Intravenous Every 1 hr x 2 01/21/19 1703 01/21/19 2042   01/21/19 1645  ceFEPIme (MAXIPIME) 1 g in sodium chloride 0.9 % 100 mL IVPB     1 g 200 mL/hr over 30 Minutes Intravenous  Once 01/21/19 1633 01/21/19 1802        Objective:   Vitals:   01/25/19 1630 01/25/19 1639 01/26/19 0030 01/26/19 0500  BP: 114/72 114/69 117/68   Pulse: (!) 103 (!) 109 (!) 110   Resp: (!) 22 18 18    Temp:  97.8 F (36.6 C) 98 F (36.7 C)   TempSrc:  Oral Oral   SpO2: 95% 96% 97%   Weight:    99 kg  Height:        Wt Readings from Last 3 Encounters:  01/26/19 99 kg  11/04/18 100.5 kg  10/11/18 100.7 kg     Intake/Output Summary (Last 24 hours) at 01/26/2019 [id number] Last data filed at 01/26/2019 [id number] Gross per 24 hour  Intake 515.51 ml  Output 1298 ml  Net -782.49 ml     Physical Exam Patient is examined daily including today  on 01/26/19 , exams remain the same as of yesterday except that has changed   Gen:- Awake Alert, in no acute distress HEENT:- [place].AT, No sclera icterus Neck-Supple Neck,No JVD,.  Lungs-diminished on right with scattered rhonchi, right-sided chest tube/drain CV- S1, S2 normal, regular  Abd-umbilical hernia, +ve Ascites, +ve abdominal distention, no significant tenderness Extremity/Skin:- 2 +ve edema, pedal pulses present  Psych-affect is appropriate, oriented x3 Neuro-no new focal deficits,     Data Review:   Micro Results Recent Results (from the past 240 hour(s))  SARS Coronavirus 2 [facility](Hospital order, Performed in [facility])     Status: None   Collection Time: 01/21/19  3:58 PM  Result Value Ref Range Status   SARS Coronavirus 2 NEGATIVE NEGATIVE Final    Comment: (NOTE) If result is NEGATIVE SARS-CoV-2 target nucleic  acids are NOT DETECTED. The SARS-CoV-2 RNA is generally detectable in upper and lower  respiratory specimens during the acute phase of infection. The lowest  concentration of SARS-CoV-2 viral copies this assay can detect is 250  copies / mL. A negative result does not preclude SARS-CoV-2 infection  and should not be used as the sole basis for treatment or other  patient management decisions.  A negative result may occur with  improper specimen collection / handling, submission of specimen other  than nasopharyngeal swab, presence of viral mutation(s) within the  areas targeted by this assay, and inadequate number of viral copies  (<250 copies / mL). A negative result must be combined with clinical  observations, patient history, and epidemiological information. If result is POSITIVE SARS-CoV-2 target nucleic acids are DETECTED. The SARS-CoV-2 RNA is generally detectable in upper and lower  respiratory specimens dur ing the acute phase of infection.  Positive  results are indicative of active infection with SARS-CoV-2.  Clinical  correlation with  patient history and other diagnostic information is  necessary to determine patient infection status.  Positive results do  not rule out bacterial infection or co-infection with other viruses. If result is PRESUMPTIVE POSTIVE SARS-CoV-2 nucleic acids MAY BE PRESENT.   A presumptive positive result was obtained on the submitted specimen  and confirmed on repeat testing.  While 2019 novel coronavirus  (SARS-CoV-2) nucleic acids may be present in the submitted sample  additional confirmatory testing may be necessary for epidemiological  and / or clinical management purposes  to differentiate between  SARS-CoV-2 and other Sarbecovirus currently known to infect humans.  If clinically indicated additional testing with an alternate test  methodology [phone]) is advised. The SARS-CoV-2 RNA is generally  detectable in upper and lower respiratory sp ecimens during the acute  phase of infection. The expected result is Negative. Fact Sheet for Patients:  BoilerBrush.com.cy Fact Sheet for Healthcare Providers: https://pope.com/ This test is not yet approved or cleared by the [place] FDA and has been authorized for detection and/or diagnosis of SARS-CoV-2 by FDA under an Emergency Use Authorization (EUA).  This EUA will remain in effect (meaning this test can be used) for the duration of the COVID-19 declaration under Section 564(b)(1) of the Act, 21 U.S.C. section 360bbb-3(b)(1), unless the authorization is terminated or revoked sooner. Performed at [facility], [address], [place], [place] [address]   Culture, blood (Routine X 2) w Reflex to ID Panel     Status: None   Collection Time: 01/21/19  4:53 PM  Result Value Ref Range Status   Specimen Description   Final    LEFT ANTECUBITAL BOTTLES DRAWN AEROBIC AND ANAEROBIC   Special Requests Blood Culture adequate volume  Final   Culture   Final    NO GROWTH 5 DAYS Performed at [facility], [address] [place], [place] [address]    Report Status 01/26/2019 FINAL  Final  Culture, blood (Routine X 2) w Reflex to ID Panel     Status: None   Collection Time: 01/21/19  4:54 PM  Result Value Ref Range Status   Specimen Description BLOOD LEFT ARM BOTTLES DRAWN AEROBIC ONLY  Final   Special Requests Blood Culture adequate volume  Final   Culture   Final    NO GROWTH 5 DAYS Performed at [facility], [address] [place], [place] [address]    Report Status 01/26/2019 FINAL  Final  Urine Culture     Status: None  Collection Time: 01/21/19  5:31 PM  Result Value Ref Range Status   Specimen Description   Final    URINE, CLEAN CATCH Performed at [facility], [address], [place], [place] [address]    Special Requests   Final    NONE Performed at [facility], [address], [place], [place] [address]    Culture   Final    NO GROWTH Performed at [facility], [address], [place], [place] [address]    Report Status 01/23/2019 FINAL  Final  MRSA PCR Screening     Status: None   Collection Time: 01/22/19  1:00 AM  Result Value Ref Range Status   MRSA by PCR NEGATIVE NEGATIVE Final    Comment:        The GeneXpert MRSA Assay (FDA approved for NASAL specimens only), is one component of a comprehensive MRSA colonization surveillance program. It is not intended to diagnose MRSA infection nor to guide or monitor treatment for MRSA infections. Performed at [facility], [address] [place], [place] [address]   Culture, sputum-assessment     Status: None   Collection Time: 01/22/19  5:02 AM  Result Value Ref Range Status   Specimen Description SPUTUM  Final   Special Requests Normal  Final   Sputum evaluation   Final    THIS SPECIMEN IS ACCEPTABLE FOR SPUTUM CULTURE Performed at [facility], [address], [place], [place] [address]    Report Status 01/22/2019 FINAL  Final  Culture, respiratory     Status: None   Collection Time:  01/22/19  5:02 AM  Result Value Ref Range Status   Specimen Description SPUTUM  Final   Special Requests Normal Reflexed from [id number]  Final   Gram Stain   Final    MODERATE WBC PRESENT, PREDOMINANTLY PMN FEW GRAM NEGATIVE RODS RARE GRAM POSITIVE COCCI IN PAIRS    Culture   Final    Consistent with normal respiratory flora. Performed at [facility], [address], [place], [place] [address]    Report Status 01/24/2019 FINAL  Final  Gram stain     Status: None   Collection Time: 01/23/19  3:18 PM  Result Value Ref Range Status   Specimen Description FLUID PERITONEAL  Final   Special Requests NONE  Final   Gram Stain   Final    RARE WBC PRESENT,BOTH PMN AND MONONUCLEAR NO ORGANISMS SEEN Performed at [facility], [address], [place], [place] [address]    Report Status 01/23/2019 FINAL  Final  Culture, body fluid-bottle     Status: None (Preliminary result)   Collection Time: 01/23/19  3:18 PM  Result Value Ref Range Status   Specimen Description FLUID PERITONEAL  Final   Special Requests BOTTLES DRAWN AEROBIC AND ANAEROBIC 10CC  Final   Culture   Final    NO GROWTH 3 DAYS Performed at [facility], [address], [place], [place] [address]    Report Status PENDING  Incomplete  Aerobic/Anaerobic Culture (surgical/deep wound)     Status: None (Preliminary result)   Collection Time: 01/25/19  3:57 PM  Result Value Ref Range Status   Specimen Description LUNG RIGHT PLEURAL  Final   Special Requests NONE  Final   Gram Stain   Final    ABUNDANT WBC PRESENT,BOTH PMN AND MONONUCLEAR ABUNDANT GRAM NEGATIVE RODS Performed at [facility], [address], [place], [place]  [address]    Culture PENDING  Incomplete   Report Status PENDING  Incomplete    Radiology Reports Ct Chest Wo Contrast  Result Date: 01/21/2019 CLINICAL DATA:  Right lower lobe opacity and complex pleural fluid seen on prior abdominal CT. EXAM: CT CHEST WITHOUT CONTRAST TECHNIQUE:  Multidetector CT imaging of the chest was performed following the standard protocol without IV contrast. COMPARISON:  CT abdomen 01/21/2019 FINDINGS: Cardiovascular: Heart is normal size. Aorta is normal caliber. Coronary artery calcification in the left anterior descending coronary artery. Mediastinum/Nodes: No mediastinal, hilar, or axillary adenopathy. Lungs/Pleura: Dense consolidation noted in the right lower lobe compatible with pneumonia. Loculated pleural fluid noted along the right heart border and superior laterally in the right hemithorax. There is a rounded fluid collection posteriorly in the right lower hemithorax measuring 7.2 cm concerning for empyema or pulmonary abscess. On coronal imaging, this extends over a craniocaudal length of 10 cm. Dependent atelectasis in the left lower lobe. Upper Abdomen: Changes of cirrhosis with ascites. Musculoskeletal: No acute bony abnormality. Chest wall soft tissues are unremarkable. IMPRESSION: Dense consolidation in the right lower lobe compatible with pneumonia. Loculated right pleural fluid. Rounded fluid collection posteriorly in the right lower hemithorax which could be loculated fluid and reflect empyema or pulmonary abscess. Coronary artery disease. Cirrhosis, ascites. Electronically Signed   By: Charlett Nose M.D.   On: 01/21/2019 20:28   Ct Abdomen Pelvis W Contrast  Result Date: 01/21/2019 CLINICAL DATA:  Coughing since January. History of thrombocytopenia, splenomegaly and cirrhosis. Recently diagnosed with pneumonia. EXAM: CT ABDOMEN AND PELVIS WITH CONTRAST TECHNIQUE: Multidetector CT imaging of the abdomen and pelvis was performed using the standard protocol following bolus administration of intravenous contrast. CONTRAST:  OMNIPAQUE IOHEXOL 300 MG/ML  SOLN COMPARISON:  Chest radiographs today. Abdominopelvic CT 05/08/2016. Right upper quadrant ultrasound 06/30/2018. FINDINGS: Lower chest: The lung bases are incompletely visualized. There is  consolidation of the visualized right lower lobe with a large loculated right pleural effusion. This has a component posteriorly with thick peripheral enhancement, measuring up to 7.9 x 5.2 cm on image 9/2. Based on the provided history, this is suspicious for empyema and possible pleural-based abscess. There is minimal atelectasis at the left lung base and no significant left pleural effusion. The azygos and hemi azygous veins are mildly dilated. Hepatobiliary: There are progressive changes of cirrhosis throughout the liver with diffuse contour irregularity and parenchymal heterogeneity. No dominant mass or focally abnormal enhancement identified. The portal vein is patent. No evidence of gallstones, gallbladder wall thickening or biliary dilatation. Pancreas: Unremarkable. No pancreatic ductal dilatation or surrounding inflammatory changes. Spleen: Measures approximately 14.7 x 7.5 x 12.3 cm (volume = 710 cm^3) consistent with mild splenomegaly. No focal abnormality. Adrenals/Urinary Tract: Both adrenal glands appear normal. The kidneys, ureters and bladder appear normal. Stomach/Bowel: No evidence of bowel wall thickening, distention or surrounding inflammatory change. There is a large amount of stool within the rectum. There is a probable loop of small bowel extending into an umbilical hernia. No evidence of bowel obstruction or incarceration. Vascular/Lymphatic: There are no enlarged abdominal or pelvic lymph nodes. Minimal aortic atherosclerosis. No acute vascular findings are identified. 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 The portal, superior mesenteric and splenic veins appear patent. Reproductive: The prostate gland and seminal vesicles appear normal. Other: Interval development of a moderate amount of ascites. Fluid extends into an umbilical hernia which measures up to 8.6 cm transverse on image 73/2. As above, there is  air within this hernia as well which appears to  be due to a small knuckle of small bowel. Musculoskeletal: No acute osseous findings. There are old rib fractures on the left. There are changes of bilateral femoral head avascular necrosis with early subchondral collapse on the right. There is edema throughout the subcutaneous fat. In the upper right buttocks, there is a globular area of increased signal which could be related to prior trauma/injection or reflect fat necrosis. IMPRESSION: 1. Right lower lobe pulmonary consolidation with complex fluid in the right pleural space, worrisome for empyema and pleural-based abscess. Full chest CT may be helpful for further evaluation. 2. Significantly progressive changes of diffuse hepatic cirrhosis compared with previous CT from 2017. Mild changes of portal hypertension with splenomegaly and venous collaterals. The main portal vein appears patent. 3. Progressive moderate volume ascites. Ascites and a small bowel loop extend into an umbilical hernia. No evidence of bowel obstruction or incarceration. 4. Chronic bilateral femoral head avascular necrosis. Electronically Signed   By: Carey Bullocks M.D.   On: 01/21/2019 19:37   US Renal  Result Date: 01/23/2019 CLINICAL DATA:  Renal failure EXAM: RENAL / URINARY TRACT ULTRASOUND COMPLETE COMPARISON:  None. FINDINGS: Right Kidney: Renal measurements: 12.4 x 6.8 x 6.4 cm = volume: 286 mL . Echogenicity within normal limits. No mass or hydronephrosis visualized. Left Kidney: Renal measurements: 11.8 x 6 x 6.5 cm = volume: 239 mL. Echogenicity within normal limits. No mass or hydronephrosis visualized. Bladder: Appears normal for degree of bladder distention. Other: Increased hepatic echogenicity with a slightly nodular contour as can be seen with hepatic cirrhosis. Small amount of perihepatic ascites. Splenomegaly with the spleen measuring 18.5 cm in length and a volume of 1535 mL. IMPRESSION: 1. Normal renal ultrasound. 2. Increased hepatic echogenicity with a slightly  nodular contour as can be seen with hepatic cirrhosis. Small volume perihepatic ascites. Splenomegaly. Correlate with liver function test. Electronically Signed   By: Elige Ko   On: 01/23/2019 10:06   Dg Chest Port 1 View  Result Date: 01/26/2019 CLINICAL DATA:  Chest tube EXAM: PORTABLE CHEST 1 VIEW COMPARISON:  01/21/2019 FINDINGS: Right basilar chest tube is now in place. Right pleural effusion and basilar pulmonary opacity improved. Increasing small left pleural effusion. Upper lungs clear. Cardiomegaly. No pneumothorax. IMPRESSION: Improved right pleural effusion after chest tube placement. New small left pleural effusion. Electronically Signed   By: Jolaine Click M.D.   On: 01/26/2019 08:00   Dg Chest Portable 1 View  Result Date: 01/21/2019 CLINICAL DATA:  38 year old with current history of hepatic cirrhosis and, by the patient's report, recent diagnosis of pneumonia at an outside facility. EXAM: PORTABLE CHEST 1 VIEW COMPARISON:  02/21/2018 and earlier, including CTA chest 05/13/2015. FINDINGS: Cardiac silhouette moderately to markedly enlarged, unchanged since 2019 but increased in size since 2016. Pulmonary venous hypertension with perhaps minimal interstitial pulmonary edema. Large RIGHT pleural effusion and associated consolidation in the RIGHT LOWER LOBE. Lungs otherwise clear. No visible LEFT pleural effusion. IMPRESSION: 1. Large RIGHT pleural effusion and associated passive atelectasis and/or pneumonia involving the RIGHT LOWER LOBE. 2. Stable moderate to marked cardiomegaly. Pulmonary venous hypertension with perhaps minimal interstitial pulmonary edema indicating CHF and/or fluid overload. Electronically Signed   By: Hulan Saas M.D.   On: 01/21/2019 16:25   Ct Image Guided Fluid Drain By Catheter  Result Date: 01/25/2019 INDICATION: 38 year old with right lung pneumonia, loculated right pleural fluid and a focal collection in the posterior lower chest. Findings  are concerning  for an empyema or lung abscess. Plan for percutaneous drainage of this posterior right chest fluid collection. EXAM: CT-GUIDED PLACEMENT OF RIGHT CHEST TUBE MEDICATIONS: No antibiotics were given for this procedure. ANESTHESIA/SEDATION: Fentanyl 50 mcg IV; Versed 1.5 mg IV Moderate Sedation Time:  41 minutes The patient was continuously monitored during the procedure by the interventional radiology nurse under my direct supervision. COMPLICATIONS: None immediate. PROCEDURE: Informed written consent was obtained from the patient after a thorough discussion of the procedural risks, benefits and alternatives. All questions were addressed. Maximal Sterile Barrier Technique was utilized including caps, mask, sterile gowns, sterile gloves, sterile drape, hand hygiene and skin antiseptic. A timeout was performed prior to the initiation of the procedure. Patient was placed prone. CT images through the lower chest were obtained. The right side of the chest was prepped and draped in sterile fashion. Skin and soft tissues anesthetized with 1% lidocaine. Using CT guidance, a Yueh catheter was directed into the right posterior lower chest fluid collection. Initially, thick yellow fluid was aspirated. Stiff Amplatz wire was advanced into the collection and the patient started to cough and became symptomatic. The wire was removed for the Yueh catheter again. Additional bloody fluid was removed from the chest collection. Eventually, the patient's coughing stopped. Subsequently, Yueh catheter was removed over a stiff Amplatz wire and the percutaneous tract was dilated to accommodate a 12 [place] drain. Approximately 100 mL of thick bloody purulent fluid was removed from the chest collection. The tube was starting to clot and the tube was irrigated with normal saline. The tube was attached to a PleurEvac and wall suction. Additional bloody fluid was removed. Catheter was sutured to skin and a bandage was placed. FINDINGS: Again noted  is a complex fluid collection in the posteromedial right lower chest adjacent to right lung consolidation. Catheter was directed into the fluid collection and thick bloody purulent fluid was removed. 12 French drain was placed. IMPRESSION: CT-guided placement of a 12 French drain within the collection in the posterior right lower chest. Findings are concerning for a lung abscess or empyema. Electronically Signed   By: Richarda Overlie M.D.   On: 01/25/2019 17:42   Ir Paracentesis  Result Date: 01/23/2019 INDICATION: Patient with history of alcoholic cirrhosis with recurrent ascites. Request is made for diagnostic and therapeutic paracentesis. EXAM: ULTRASOUND GUIDED DIAGNOSTIC AND THERAPEUTIC PARACENTESIS MEDICATIONS: 10 mL 1% lidocaine COMPLICATIONS: None immediate. PROCEDURE: Informed written consent was obtained from the patient after a discussion of the risks, benefits and alternatives to treatment. A timeout was performed prior to the initiation of the procedure. Initial ultrasound scanning demonstrates a moderate amount of ascites within the right lower abdominal quadrant. The right lower abdomen was prepped and draped in the usual sterile fashion. 1% lidocaine was used for local anesthesia. Following this, a 19 gauge, 7-cm, Yueh catheter was introduced. An ultrasound image was saved for documentation purposes. The paracentesis was performed. The catheter was removed and a dressing was applied. The patient tolerated the procedure well without immediate post procedural complication. FINDINGS: A total of approximately 2.2 L of clear yellow fluid was removed. Samples were sent to the laboratory as requested by the clinical team. IMPRESSION: Successful ultrasound-guided paracentesis yielding 2.2 L of peritoneal fluid. Read by: Elwin Mocha, PA-C Electronically Signed   By: Richarda Overlie M.D.   On: 01/23/2019 15:46     CBC Recent Labs  Lab 01/21/19 1536 01/22/19 0049 01/23/19 0627 01/24/19 0447 01/25/19 0556   WBC 17.0*  15.6* 29.9* 16.1* 14.0*  HGB 11.8* 10.8* 10.1* 8.4* 7.5*  HCT 33.2* 30.3* 28.6* 24.1* 21.9*  PLT 171 145* 125* 99* 85*  MCV 96.0 94.7 97.9 97.6 99.5  MCH 34.1* 33.8 34.6* 34.0 34.1*  MCHC 35.5 35.6 35.3 34.9 34.2  RDW 14.0 13.8 14.7 14.9 15.6*  LYMPHSABS 1.5  --  1.3 0.5*  --   MONOABS 1.3*  --  1.6* 0.7  --   EOSABS 0.1  --  0.1 0.1  --   BASOSABS 0.1  --  0.1 0.1  --     Chemistries  Recent Labs  Lab 01/21/19 1536 01/22/19 0049 01/23/19 0627 01/24/19 0447 01/25/19 0556  NA 121* 122* 124* 124* 129*  K 3.4* 3.4* 4.2 4.9 4.4  CL 88* 89* 93* 96* 101  CO2 22 20* 21* 17* 17*  GLUCOSE 117* 104* 110* 87 103*  BUN 7 5* 9 16 21*  CREATININE 0.60* 0.64 1.76* 1.47* 1.37*  CALCIUM 7.9* 8.0* 8.0* 8.6* 9.3  MG  --   --  1.1* 2.0  --   AST 127* 129* 124* 80* 56*  ALT 41 40 40 30 24  ALKPHOS 170* 154* 162* 105 94  BILITOT 2.9* 3.1* 4.7* 6.7* 6.9*   ------------------------------------------------------------------------------------------------------------------ No results for input(s): CHOL, HDL, LDLCALC, TRIG, CHOLHDL, LDLDIRECT in the last 72 hours.  No results found for: HGBA1C ------------------------------------------------------------------------------------------------------------------ No results for input(s): TSH, T4TOTAL, T3FREE, THYROIDAB in the last 72 hours.  Invalid input(s): FREET3 ------------------------------------------------------------------------------------------------------------------ No results for input(s): VITAMINB12, FOLATE, FERRITIN, TIBC, IRON, RETICCTPCT in the last 72 hours.  Coagulation profile Recent Labs  Lab 01/23/19 0627 01/24/19 0447 01/24/19 1424 01/25/19 0556 01/26/19 0424  INR 3.3* 2.7* 2.2* 1.9* 1.6*    No results for input(s): DDIMER in the last 72 hours.  Cardiac Enzymes No results for input(s): CKMB, TROPONINI, MYOGLOBIN in the last 168 hours.  Invalid input(s):  CK ------------------------------------------------------------------------------------------------------------------    Component Value Date/Time   BNP 318.0 (H) 01/21/2019 1648     Shon Hale M.D on 01/26/2019 at 8:21 AM  Go to [email or web address] - for contact info  [facility] - Office  [phone]

## 2019-01-26 NOTE — Progress Notes (Signed)
  Subjective: Feels better this AM Still has a lot of swelling  Objective: Vital signs in last 24 hours: Temp:  [97.8 F (36.6 C)-98.3 F (36.8 C)] 98 F (36.7 C) (04/30 0030) Pulse Rate:  [99-114] 110 (04/30 0030) Cardiac Rhythm: Normal sinus rhythm (04/30 0700) Resp:  [18-25] 18 (04/30 0030) BP: (95-117)/(56-73) 117/68 (04/30 0030) SpO2:  [93 %-97 %] 97 % (04/30 0030) Weight:  [99 kg] 99 kg (04/30 0500)  Hemodynamic parameters for last 24 hours:    Intake/Output from previous day: 04/29 0701 - 04/30 0700 In: 515.5 [P.O.:240; IV Piggyback:275.5] Out: 1298 [Urine:1100; Chest Tube:198] Intake/Output this shift: No intake/output data recorded.  A& O, no distress Lungs diminished at bases ~100 ml of bloody drainage from CT 3+ edema  Lab Results: Recent Labs    01/24/19 0447 01/25/19 0556  WBC 16.1* 14.0*  HGB 8.4* 7.5*  HCT 24.1* 21.9*  PLT 99* 85*   BMET:  Recent Labs    01/24/19 0447 01/25/19 0556  NA 124* 129*  K 4.9 4.4  CL 96* 101  CO2 17* 17*  GLUCOSE 87 103*  BUN 16 21*  CREATININE 1.47* 1.37*  CALCIUM 8.6* 9.3    PT/INR:  Recent Labs    01/26/19 0424  LABPROT 18.6*  INR 1.6*  PORTABLE CHEST 1 VIEW  COMPARISON:  01/21/2019  FINDINGS: Right basilar chest tube is now in place. Right pleural effusion and basilar pulmonary opacity improved. Increasing small left pleural effusion. Upper lungs clear. Cardiomegaly. No pneumothorax.  IMPRESSION: Improved right pleural effusion after chest tube placement.  New small left pleural effusion.   Electronically Signed   By: Jolaine Click M.D.   On: 01/26/2019 08:00 I personally reviewed the CXR images and concur with the findings noted above  Assessment/Plan: Right lower lobe pneumonia with abscess v empyema- drain placed yesterday. Moderate bloody drainage from tube. Will flush tube to ensure it stays open and monitor output. CXR improved aeration at right base although nowhere near  completely resolved.  GNR in abscess fluid On cefepime New left pleural effusion. May be related to general edema, but would follow with low threshold to tap that effusion   LOS: 5 days    Grant [name] 01/26/2019

## 2019-01-26 NOTE — Plan of Care (Signed)
  Problem: Respiratory: Goal: Ability to maintain adequate ventilation will improve Outcome: Progressing   Problem: Nutrition: Goal: Adequate nutrition will be maintained Outcome: Progressing   Problem: Pain Managment: Goal: General experience of comfort will improve Outcome: Progressing   Problem: Skin Integrity: Goal: Risk for impaired skin integrity will decrease Outcome: Progressing

## 2019-01-27 ENCOUNTER — Inpatient Hospital Stay (HOSPITAL_COMMUNITY): Payer: Medicaid - Out of State

## 2019-01-27 LAB — CBC
HCT: 24.5 % — ABNORMAL LOW (ref 39.0–52.0)
Hemoglobin: 8.2 g/dL — ABNORMAL LOW (ref 13.0–17.0)
MCH: 33.9 pg (ref 26.0–34.0)
MCHC: 33.5 g/dL (ref 30.0–36.0)
MCV: 101.2 fL — ABNORMAL HIGH (ref 80.0–100.0)
Platelets: 86 10*3/uL — ABNORMAL LOW (ref 150–400)
RBC: 2.42 MIL/uL — ABNORMAL LOW (ref 4.22–5.81)
RDW: 16.7 % — ABNORMAL HIGH (ref 11.5–15.5)
WBC: 10.7 10*3/uL — ABNORMAL HIGH (ref 4.0–10.5)
nRBC: 0 % (ref 0.0–0.2)

## 2019-01-27 LAB — BASIC METABOLIC PANEL
Anion gap: 10 (ref 5–15)
BUN: 18 mg/dL (ref 6–20)
CO2: 19 mmol/L — ABNORMAL LOW (ref 22–32)
Calcium: 9.6 mg/dL (ref 8.9–10.3)
Chloride: 102 mmol/L (ref 98–111)
Creatinine, Ser: 1.08 mg/dL (ref 0.61–1.24)
GFR calc Af Amer: >60 mL/min (ref >60)
GFR calc non Af Amer: >60 mL/min (ref >60)
Glucose, Bld: 98 mg/dL (ref 70–99)
Potassium: 4.5 mmol/L (ref 3.5–5.1)
Sodium: 131 mmol/L — ABNORMAL LOW (ref 135–145)

## 2019-01-27 LAB — PROTIME-INR
INR: 1.6 — ABNORMAL HIGH (ref 0.8–1.2)
Prothrombin Time: 18.5 seconds — ABNORMAL HIGH (ref 11.4–15.2)

## 2019-01-27 MED ORDER — LACTULOSE 10 GM/15ML PO SOLN
10.0000 g | Freq: Every day | ORAL | Status: DC
  Administered 2019-01-28 – 2019-02-02 (×6): 10 g via ORAL
  Filled 2019-01-27 (×6): qty 15

## 2019-01-27 MED ORDER — SODIUM CHLORIDE 0.9 % IV SOLN
2.0000 g | INTRAVENOUS | Status: DC
  Administered 2019-01-27 – 2019-02-01 (×6): 2 g via INTRAVENOUS
  Filled 2019-01-27 (×7): qty 20

## 2019-01-27 NOTE — Progress Notes (Signed)
      [address]       [name] [address]             [phone]      Feels better today, minimal discomfort from CT  BP 103/65 (BP Location: Left Arm)   Pulse (!) 115   Temp 97.6 F (36.4 C) (Oral)   Resp 19   Ht 5\' 10"  (1.778 m)   Wt 99 kg   SpO2 90%   BMI 31.32 kg/m   Intake/Output Summary (Last 24 hours) at 01/27/2019 1316 Last data filed at 01/27/2019 1020 Gross per 24 hour  Intake 480 ml  Output 2225 ml  Net -1745 ml   Drain 50 ml  Lungs still diminished R base Ext- edema improved significantly  WBC down to 10.7 Creatinine down to 1.08 Cultures growing E. coli  PORTABLE CHEST 1 VIEW  COMPARISON:  Radiograph of January 26, 2019.  FINDINGS: The heart size and mediastinal contours are within normal limits. Left lung is clear. Stable position of right-sided pleural drainage catheter is noted. Right pleural effusion is unchanged. There is associated atelectasis in right lung base. No pneumothorax is noted. The visualized skeletal structures are unremarkable.  IMPRESSION: Stable position of right-sided pleural drainage catheter with stable right pleural effusion and associated atelectasis.   Electronically Signed   By: Lupita Raider M.D.   On: 01/27/2019 07:13 I personally reviewed the CXR images and concur with the findings noted above  IMPRESSION  Continues to improve with no fever x 48 hours Cultures from lung abscess growing E coli- on Maxipime Will flush catheter again today  Viviann Spare C. Dorris Fetch, MD [facility] [phone]

## 2019-01-27 NOTE — Progress Notes (Signed)
Patient Demographics:    Grant [name], is a 38 y.o. male, DOB - July 27, 1981, ZOX:[id number]  Admit date - 01/21/2019   Admitting Physician Glade Lloyd, MD  Outpatient Primary MD for the patient is Patient, No Pcp Per  LOS - 6  Chief Complaint  Patient presents with   Shortness of Breath        Subjective:    Grant [name] today has no fevers, no emesis, chest wall pain at chest tube site... Eating and drinking okay  Assessment  & Plan :    Principal Problem:   Acute respiratory failure with hypoxia (HCC) Active Problems:   Alcoholic cirrhosis of liver with ascites (HCC)   Esophageal reflux   Morbid obesity due to excess calories (HCC)   Hypokalemia   Ascites   RLL pneumonia (HCC)   Empyema of right pleural space (HCC)   Hyponatremia   Supratherapeutic INR   Lactic acidosis  Brief Summary 38 year old male with history of alcoholic cirrhosis, portal vein thrombosis diagnosed in 06/2018 for which he is currently on Xarelto, severe microcytic anemia, GERD, splenomegaly presented with shortness of breath and cough worsening over the past 3 weeks, with COVID-19 testing done as an outpatient 3 weeks PTA apparently negative, repeat COVID test on 01/21/2019 at our facility was also negative. Admitted on 01/21/2027 right lower lobe pneumonia with loculated pleural fluid, likely empyema Versus Abscess.  He was also found to have elevated INR along with hyponatremia.  CT surgery was consulted and recommends percutaneous drainage by interventional radiology, s/p Rt pleural fluid drain/chest tube placement on 01/25/2019  A/p 1)Rt LL pneumonia with loculated pleural fluid, likely empyema Versus Abscess.---- s/p  percutaneous drainage by IR on 01/25/2019, vancomycin discontinued on 01/23/2019, pleural fluid culture from 01/25/2019 growing pansensitive E. coli, okay to discontinue cefepime, treat empirically with IV  Rocephin starting 01/27/2019 , strep pneumo antigen negative ,   WBC is down to 10.7 from a peak of 16.1 -- Interventional radiology and CT surgery helping to manage chest tube- S/p (R)pigtail chest tube -output serosanguinous, 50 mL recorded past 24 hrs, No air leak. Follow-up CXR improved.  2)SBP--- paracentesis fluid from 01/23/2019 with 258 ANC consistent with SBP, stop cefepime, start IV Rocephin on 01/27/2019 , paracentesis fluid cx NGTD  3)Supratherapeutic INR--- liver disease with some degree of auto anticoagulation--- PTA was on Xarelto for portal vein thrombosis, patient received vitamin K, INR 1.6  4)Alcoholic liver cirrhosis with ascites--- status post paracentesis on 01/23/2019--- denies recent alcohol use, currently on Lasix, continue Aldactone at 12.5 mg.. Continue folic acid and MVI.  Give lactulose 10 g daily..... No evidence of encephalopathy at this time, patient requesting ammonia level check.   5)FEN/hypokalemia/hypomagnesemia//hyponatremia--- continue to replace potassium and magnesium, low sodium is probably from hypervolemic state/hemodilution, continue with IV Lasix and fluid restriction  6)AKI--- possibly contrast induced ATN, nephrology consult appreciated,, ultrasound without significant abnormalities...., Creatinine baseline was 0.6, creatinine peak was 1.76, creatinine is now down to 1.0.Marland KitchenMarland Kitchen Continue to avoid nephrotoxic agents  7)Chronic Thrombocytopenia/chronic Anemia-----most likely due to underlying liver disease/cirrhosis, no evidence of ongoing bleeding,  Hgb is down to 8.2, platelet count is 86, continue to monitor--no indication for transfusion at this time  Disposition/Need for in-Hospital Stay- patient unable to be  discharged at this time due to right lung empyema/abscess requiring percutaneous drainage/tube placement and IV antibiotics   Code Status : full  Family Communication:   na  Disposition Plan  : Possibly home when medically improved  Procedure:  01/25/19  CT guided chest drain placement Findings: Thick wall collection in right posterior lower chest.  Thick bloody purulent fluid aspirated, approximately 100 ml.  12 Fr drain placed.  Thick bloody fluid draining at end of procedure.   Consults  : CT surgery/IR  DVT Prophylaxis  :  - SCDs/Xarelto on hold  Lab Results  Component Value Date   PLT 86 (L) 01/27/2019   Inpatient Medications  Scheduled Meds:  folic acid  1 mg Oral Daily   furosemide  40 mg Intravenous TID   lactulose  10 g Oral Daily   magnesium oxide  400 mg Oral Daily   mouth rinse  15 mL Mouth Rinse BID   multivitamin with minerals  1 tablet Oral Daily   pantoprazole  40 mg Oral Daily   potassium chloride  40 mEq Oral BID   spironolactone  12.5 mg Oral Daily   thiamine  100 mg Oral Daily   Continuous Infusions:  cefTRIAXone (ROCEPHIN)  IV     PRN Meds:.benzonatate, guaiFENesin-dextromethorphan    Anti-infectives (From admission, onward)   Start     Dose/Rate Route Frequency Ordered Stop   01/27/19 1600  cefTRIAXone (ROCEPHIN) 2 g in sodium chloride 0.9 % 100 mL IVPB     2 g 200 mL/hr over 30 Minutes Intravenous Every 24 hours 01/27/19 1345     01/22/19 1000  ceFEPIme (MAXIPIME) 2 g in sodium chloride 0.9 % 100 mL IVPB  Status:  Discontinued     2 g 200 mL/hr over 30 Minutes Intravenous Every 8 hours 01/22/19 0904 01/27/19 1345   01/22/19 0400  vancomycin (VANCOCIN) IVPB 1000 mg/200 mL premix  Status:  Discontinued     1,000 mg 200 mL/hr over 60 Minutes Intravenous Every 12 hours 01/21/19 1820 01/23/19 0855   01/21/19 1800  vancomycin (VANCOCIN) IVPB 1000 mg/200 mL premix     1,000 mg 200 mL/hr over 60 Minutes Intravenous Every 1 hr x 2 01/21/19 1703 01/21/19 2042   01/21/19 1645  ceFEPIme (MAXIPIME) 1 g in sodium chloride 0.9 % 100 mL IVPB     1 g 200 mL/hr over 30 Minutes Intravenous  Once 01/21/19 1633 01/21/19 1802        Objective:   Vitals:   01/26/19 0500 01/26/19 1613  01/27/19 0048 01/27/19 0801  BP:  130/85 117/68 103/65  Pulse:  (!) 122 (!) 111 (!) 115  Resp:  16 20 19   Temp:  98.3 F (36.8 C) 98.8 F (37.1 C) 97.6 F (36.4 C)  TempSrc:  Oral Oral Oral  SpO2:  (!) 84% 95% 90%  Weight: 99 kg     Height:        Wt Readings from Last 3 Encounters:  01/26/19 99 kg  11/04/18 100.5 kg  10/11/18 100.7 kg     Intake/Output Summary (Last 24 hours) at 01/27/2019 1355 Last data filed at 01/27/2019 1330 Gross per 24 hour  Intake 480 ml  Output 2475 ml  Net -1995 ml   Physical Exam Patient is examined daily including today on 01/27/19 , exams remain the same as of yesterday except that has changed   Gen:- Awake Alert, in no acute distress HEENT:- [place].AT, No sclera icterus Neck-Supple Neck,No JVD,.  Lungs-diminished on  right with scattered rhonchi, right-sided chest tube/drain CV- S1, S2 normal, regular  Abd-umbilical hernia, +ve Ascites, +ve abdominal distention, no significant tenderness Extremity/Skin:- 2 +ve edema, pedal pulses present  Psych-affect is appropriate, oriented x3 Neuro-no new focal deficits,     Data Review:   Micro Results Recent Results (from the past 240 hour(s))  SARS Coronavirus 2 [facility] order, Performed in [facility] hospital lab)     Status: None   Collection Time: 01/21/19  3:58 PM  Result Value Ref Range Status   SARS Coronavirus 2 NEGATIVE NEGATIVE Final    Comment: (NOTE) If result is NEGATIVE SARS-CoV-2 target nucleic acids are NOT DETECTED. The SARS-CoV-2 RNA is generally detectable in upper and lower  respiratory specimens during the acute phase of infection. The lowest  concentration of SARS-CoV-2 viral copies this assay can detect is 250  copies / mL. A negative result does not preclude SARS-CoV-2 infection  and should not be used as the sole basis for treatment or other  patient management decisions.  A negative result may occur with  improper specimen collection / handling, submission of specimen other   than nasopharyngeal swab, presence of viral mutation(s) within the  areas targeted by this assay, and inadequate number of viral copies  (<250 copies / mL). A negative result must be combined with clinical  observations, patient history, and epidemiological information. If result is POSITIVE SARS-CoV-2 target nucleic acids are DETECTED. The SARS-CoV-2 RNA is generally detectable in upper and lower  respiratory specimens dur ing the acute phase of infection.  Positive  results are indicative of active infection with SARS-CoV-2.  Clinical  correlation with patient history and other diagnostic information is  necessary to determine patient infection status.  Positive results do  not rule out bacterial infection or co-infection with other viruses. If result is PRESUMPTIVE POSTIVE SARS-CoV-2 nucleic acids MAY BE PRESENT.   A presumptive positive result was obtained on the submitted specimen  and confirmed on repeat testing.  While 2019 novel coronavirus  (SARS-CoV-2) nucleic acids may be present in the submitted sample  additional confirmatory testing may be necessary for epidemiological  and / or clinical management purposes  to differentiate between  SARS-CoV-2 and other Sarbecovirus currently known to infect humans.  If clinically indicated additional testing with an alternate test  methodology [phone]) is advised. The SARS-CoV-2 RNA is generally  detectable in upper and lower respiratory sp ecimens during the acute  phase of infection. The expected result is Negative. Fact Sheet for Patients:  BoilerBrush.com.cy Fact Sheet for Healthcare Providers: https://pope.com/ This test is not yet approved or cleared by the [place] FDA and has been authorized for detection and/or diagnosis of SARS-CoV-2 by FDA under an Emergency Use Authorization (EUA).  This EUA will remain in effect (meaning this test can be used) for the duration of  the COVID-19 declaration under Section 564(b)(1) of the Act, 21 U.S.C. section 360bbb-3(b)(1), unless the authorization is terminated or revoked sooner. Performed at [facility], [address] [place], [place] [address]   Culture, blood (Routine X 2) w Reflex to ID Panel     Status: None   Collection Time: 01/21/19  4:53 PM  Result Value Ref Range Status   Specimen Description   Final    LEFT ANTECUBITAL BOTTLES DRAWN AEROBIC AND ANAEROBIC   Special Requests Blood Culture adequate volume  Final   Culture   Final    NO GROWTH 5 DAYS Performed at [facility], [address]  [place], [place] [address]    Report Status 01/26/2019 FINAL  Final  Culture, blood (Routine X 2) w Reflex to ID Panel     Status: None   Collection Time: 01/21/19  4:54 PM  Result Value Ref Range Status   Specimen Description BLOOD LEFT ARM BOTTLES DRAWN AEROBIC ONLY  Final   Special Requests Blood Culture adequate volume  Final   Culture   Final    NO GROWTH 5 DAYS Performed at [facility], [address] [place], [place] [address]    Report Status 01/26/2019 FINAL  Final  Urine Culture     Status: None   Collection Time: 01/21/19  5:31 PM  Result Value Ref Range Status   Specimen Description   Final    URINE, CLEAN CATCH Performed at [facility], [address] [place], [place] [address]    Special Requests   Final    NONE Performed at [facility], [address] [place], [place] [address]    Culture   Final    NO GROWTH Performed at [facility], [address] [place], [place] [address]    Report Status 01/23/2019 FINAL  Final  MRSA PCR Screening     Status: None   Collection Time: 01/22/19  1:00 AM  Result Value Ref Range Status   MRSA by PCR NEGATIVE NEGATIVE Final    Comment:        The GeneXpert MRSA Assay (FDA approved for NASAL specimens only), is one component of a comprehensive MRSA colonization surveillance program. It is not intended to diagnose MRSA infection nor to  guide or monitor treatment for MRSA infections. Performed at [facility], [address] [place], [place] [address]   Culture, sputum-assessment     Status: None   Collection Time: 01/22/19  5:02 AM  Result Value Ref Range Status   Specimen Description SPUTUM  Final   Special Requests Normal  Final   Sputum evaluation   Final    THIS SPECIMEN IS ACCEPTABLE FOR SPUTUM CULTURE Performed at [facility], [address] [place], [place] [address]    Report Status 01/22/2019 FINAL  Final  Culture, respiratory     Status: None   Collection Time: 01/22/19  5:02 AM  Result Value Ref Range Status   Specimen Description SPUTUM  Final   Special Requests Normal Reflexed from [id number]  Final   Gram Stain   Final    MODERATE WBC PRESENT, PREDOMINANTLY PMN FEW GRAM NEGATIVE RODS RARE GRAM POSITIVE COCCI IN PAIRS    Culture   Final    Consistent with normal respiratory flora. Performed at [facility], [address] [place], [place] [address]    Report Status 01/24/2019 FINAL  Final  Gram stain     Status: None   Collection Time: 01/23/19  3:18 PM  Result Value Ref Range Status   Specimen Description FLUID PERITONEAL  Final   Special Requests NONE  Final   Gram Stain   Final    RARE WBC PRESENT,BOTH PMN AND MONONUCLEAR NO ORGANISMS SEEN Performed at [facility], [address] [place], [place] [address]    Report Status 01/23/2019 FINAL  Final  Culture, body fluid-bottle     Status: None (Preliminary result)   Collection Time: 01/23/19  3:18 PM  Result Value Ref Range Status   Specimen Description FLUID PERITONEAL  Final   Special Requests BOTTLES DRAWN AEROBIC AND ANAEROBIC 10CC  Final   Culture   Final    NO GROWTH 4 DAYS Performed at [facility], [address] [place], [place] [address]    Report Status PENDING  Incomplete  Aerobic/Anaerobic Culture (surgical/deep wound)     Status: None (Preliminary result)   Collection Time: 01/25/19   3:57 PM  Result Value Ref Range Status   Specimen Description LUNG RIGHT PLEURAL  Final   Special Requests NONE  Final   Gram Stain   Final    ABUNDANT WBC PRESENT,BOTH PMN AND MONONUCLEAR ABUNDANT GRAM NEGATIVE RODS Performed at [facility], [address] [place], [place] [address]    Culture   Final    MODERATE ESCHERICHIA COLI NO ANAEROBES ISOLATED; CULTURE IN PROGRESS FOR 5 DAYS    Report Status PENDING  Incomplete   Organism ID, Bacteria ESCHERICHIA COLI  Final      Susceptibility   Escherichia coli - MIC*    AMPICILLIN 4 SENSITIVE Sensitive     CEFAZOLIN <=4 SENSITIVE Sensitive     CEFEPIME <=1 SENSITIVE Sensitive     CEFTAZIDIME <=1 SENSITIVE Sensitive     CEFTRIAXONE <=1 SENSITIVE Sensitive     CIPROFLOXACIN <=0.25 SENSITIVE Sensitive     GENTAMICIN <=1 SENSITIVE Sensitive     IMIPENEM <=0.25 SENSITIVE Sensitive     TRIMETH/SULFA >=320 RESISTANT Resistant     AMPICILLIN/SULBACTAM <=2 SENSITIVE Sensitive     PIP/TAZO <=4 SENSITIVE Sensitive     Extended ESBL NEGATIVE Sensitive     * MODERATE ESCHERICHIA COLI    Radiology Reports Ct Chest Wo Contrast  Result Date: 01/21/2019 CLINICAL DATA:  Right lower lobe opacity and complex pleural fluid seen on prior abdominal CT. EXAM: CT CHEST WITHOUT CONTRAST TECHNIQUE: Multidetector CT imaging of the chest was performed following the standard protocol without IV contrast. COMPARISON:  CT abdomen 01/21/2019 FINDINGS: Cardiovascular: Heart is normal size. Aorta is normal caliber. Coronary artery calcification in the left anterior descending coronary artery. Mediastinum/Nodes: No mediastinal, hilar, or axillary adenopathy. Lungs/Pleura: Dense consolidation noted in the right lower lobe compatible with pneumonia. Loculated pleural fluid noted along the right heart border and superior laterally in the right hemithorax. There is a rounded fluid collection posteriorly in the right lower hemithorax measuring 7.2 cm concerning for  empyema or pulmonary abscess. On coronal imaging, this extends over a craniocaudal length of 10 cm. Dependent atelectasis in the left lower lobe. Upper Abdomen: Changes of cirrhosis with ascites. Musculoskeletal: No acute bony abnormality. Chest wall soft tissues are unremarkable. IMPRESSION: Dense consolidation in the right lower lobe compatible with pneumonia. Loculated right pleural fluid. Rounded fluid collection posteriorly in the right lower hemithorax which could be loculated fluid and reflect empyema or pulmonary abscess. Coronary artery disease. Cirrhosis, ascites. Electronically Signed   By: Charlett Nose M.D.   On: 01/21/2019 20:28   Ct Abdomen Pelvis W Contrast  Result Date: 01/21/2019 CLINICAL DATA:  Coughing since January. History of thrombocytopenia, splenomegaly and cirrhosis. Recently diagnosed with pneumonia. EXAM: CT ABDOMEN AND PELVIS WITH CONTRAST TECHNIQUE: Multidetector CT imaging of the abdomen and pelvis was performed using the standard protocol following bolus administration of intravenous contrast. CONTRAST:  OMNIPAQUE IOHEXOL 300 MG/ML  SOLN COMPARISON:  Chest radiographs today. Abdominopelvic CT 05/08/2016. Right upper quadrant ultrasound 06/30/2018. FINDINGS: Lower chest: The lung bases are incompletely visualized. There is consolidation of the visualized right lower lobe with a large loculated right pleural effusion. This has a component posteriorly with thick peripheral enhancement,  measuring up to 7.9 x 5.2 cm on image 9/2. Based on the provided history, this is suspicious for empyema and possible pleural-based abscess. There is minimal atelectasis at the left lung base and no significant left pleural effusion. The azygos and hemi azygous veins are mildly dilated. Hepatobiliary: There are progressive changes of cirrhosis throughout the liver with diffuse contour irregularity and parenchymal heterogeneity. No dominant mass or focally abnormal enhancement identified. The  portal vein is patent. No evidence of gallstones, gallbladder wall thickening or biliary dilatation. Pancreas: Unremarkable. No pancreatic ductal dilatation or surrounding inflammatory changes. Spleen: Measures approximately 14.7 x 7.5 x 12.3 cm (volume = 710 cm^3) consistent with mild splenomegaly. No focal abnormality. Adrenals/Urinary Tract: Both adrenal glands appear normal. The kidneys, ureters and bladder appear normal. Stomach/Bowel: No evidence of bowel wall thickening, distention or surrounding inflammatory change. There is a large amount of stool within the rectum. There is a probable loop of small bowel extending into an umbilical hernia. No evidence of bowel obstruction or incarceration. Vascular/Lymphatic: There are no enlarged abdominal or pelvic lymph nodes. Minimal aortic atherosclerosis. No acute vascular findings are identified. 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 The portal, superior mesenteric and splenic veins appear patent. Reproductive: The prostate gland and seminal vesicles appear normal. Other: Interval development of a moderate amount of ascites. Fluid extends into an umbilical hernia which measures up to 8.6 cm transverse on image 73/2. As above, there is air within this hernia as well which appears to be due to a small knuckle of small bowel. Musculoskeletal: No acute osseous findings. There are old rib fractures on the left. There are changes of bilateral femoral head avascular necrosis with early subchondral collapse on the right. There is edema throughout the subcutaneous fat. In the upper right buttocks, there is a globular area of increased signal which could be related to prior trauma/injection or reflect fat necrosis. IMPRESSION: 1. Right lower lobe pulmonary consolidation with complex fluid in the right pleural space, worrisome for empyema and pleural-based abscess. Full chest CT may be helpful for further evaluation. 2. Significantly  progressive changes of diffuse hepatic cirrhosis compared with previous CT from 2017. Mild changes of portal hypertension with splenomegaly and venous collaterals. The main portal vein appears patent. 3. Progressive moderate volume ascites. Ascites and a small bowel loop extend into an umbilical hernia. No evidence of bowel obstruction or incarceration. 4. Chronic bilateral femoral head avascular necrosis. Electronically Signed   By: Carey Bullocks M.D.   On: 01/21/2019 19:37   US Renal  Result Date: 01/23/2019 CLINICAL DATA:  Renal failure EXAM: RENAL / URINARY TRACT ULTRASOUND COMPLETE COMPARISON:  None. FINDINGS: Right Kidney: Renal measurements: 12.4 x 6.8 x 6.4 cm = volume: 286 mL . Echogenicity within normal limits. No mass or hydronephrosis visualized. Left Kidney: Renal measurements: 11.8 x 6 x 6.5 cm = volume: 239 mL. Echogenicity within normal limits. No mass or hydronephrosis visualized. Bladder: Appears normal for degree of bladder distention. Other: Increased hepatic echogenicity with a slightly nodular contour as can be seen with hepatic cirrhosis. Small amount of perihepatic ascites. Splenomegaly with the spleen measuring 18.5 cm in length and a volume of 1535 mL. IMPRESSION: 1. Normal renal ultrasound. 2. Increased hepatic echogenicity with a slightly nodular contour as can be seen with hepatic cirrhosis. Small volume perihepatic ascites. Splenomegaly. Correlate with liver function test. Electronically Signed   By: Elige Ko   On: 01/23/2019 10:06   Dg Chest Port 1 [address]  Result Date: 01/27/2019 CLINICAL DATA:  Empyema. EXAM: PORTABLE CHEST 1 VIEW COMPARISON:  Radiograph of January 26, 2019. FINDINGS: The heart size and mediastinal contours are within normal limits. Left lung is clear. Stable position of right-sided pleural drainage catheter is noted. Right pleural effusion is unchanged. There is associated atelectasis in right lung base. No pneumothorax is noted. The visualized skeletal  structures are unremarkable. IMPRESSION: Stable position of right-sided pleural drainage catheter with stable right pleural effusion and associated atelectasis. Electronically Signed   By: Lupita Raider M.D.   On: 01/27/2019 07:13   Dg Chest Port 1 View  Result Date: 01/26/2019 CLINICAL DATA:  Chest tube EXAM: PORTABLE CHEST 1 VIEW COMPARISON:  01/21/2019 FINDINGS: Right basilar chest tube is now in place. Right pleural effusion and basilar pulmonary opacity improved. Increasing small left pleural effusion. Upper lungs clear. Cardiomegaly. No pneumothorax. IMPRESSION: Improved right pleural effusion after chest tube placement. New small left pleural effusion. Electronically Signed   By: Jolaine Click M.D.   On: 01/26/2019 08:00   Dg Chest Portable 1 View  Result Date: 01/21/2019 CLINICAL DATA:  38 year old with current history of hepatic cirrhosis and, by the patient's report, recent diagnosis of pneumonia at an outside facility. EXAM: PORTABLE CHEST 1 VIEW COMPARISON:  02/21/2018 and earlier, including CTA chest 05/13/2015. FINDINGS: Cardiac silhouette moderately to markedly enlarged, unchanged since 2019 but increased in size since 2016. Pulmonary venous hypertension with perhaps minimal interstitial pulmonary edema. Large RIGHT pleural effusion and associated consolidation in the RIGHT LOWER LOBE. Lungs otherwise clear. No visible LEFT pleural effusion. IMPRESSION: 1. Large RIGHT pleural effusion and associated passive atelectasis and/or pneumonia involving the RIGHT LOWER LOBE. 2. Stable moderate to marked cardiomegaly. Pulmonary venous hypertension with perhaps minimal interstitial pulmonary edema indicating CHF and/or fluid overload. Electronically Signed   By: Hulan Saas M.D.   On: 01/21/2019 16:25   Ct Image Guided Fluid Drain By Catheter  Result Date: 01/25/2019 INDICATION: 38 year old with right lung pneumonia, loculated right pleural fluid and a focal collection in the posterior lower  chest. Findings are concerning for an empyema or lung abscess. Plan for percutaneous drainage of this posterior right chest fluid collection. EXAM: CT-GUIDED PLACEMENT OF RIGHT CHEST TUBE MEDICATIONS: No antibiotics were given for this procedure. ANESTHESIA/SEDATION: Fentanyl 50 mcg IV; Versed 1.5 mg IV Moderate Sedation Time:  41 minutes The patient was continuously monitored during the procedure by the interventional radiology nurse under my direct supervision. COMPLICATIONS: None immediate. PROCEDURE: Informed written consent was obtained from the patient after a thorough discussion of the procedural risks, benefits and alternatives. All questions were addressed. Maximal Sterile Barrier Technique was utilized including caps, mask, sterile gowns, sterile gloves, sterile drape, hand hygiene and skin antiseptic. A timeout was performed prior to the initiation of the procedure. Patient was placed prone. CT images through the lower chest were obtained. The right side of the chest was prepped and draped in sterile fashion. Skin and soft tissues anesthetized with 1% lidocaine. Using CT guidance, a Yueh catheter was directed into the right posterior lower chest fluid collection. Initially, thick yellow fluid was aspirated. Stiff Amplatz wire was advanced into the collection and the patient started to cough and became symptomatic. The wire was removed for the Yueh catheter again. Additional bloody fluid was removed from the chest collection. Eventually, the patient's coughing stopped. Subsequently, Yueh catheter was removed over a stiff Amplatz wire and the percutaneous tract was dilated to accommodate a 12 [place] drain. Approximately 100 mL  of thick bloody purulent fluid was removed from the chest collection. The tube was starting to clot and the tube was irrigated with normal saline. The tube was attached to a PleurEvac and wall suction. Additional bloody fluid was removed. Catheter was sutured to skin and a bandage was  placed. FINDINGS: Again noted is a complex fluid collection in the posteromedial right lower chest adjacent to right lung consolidation. Catheter was directed into the fluid collection and thick bloody purulent fluid was removed. 12 French drain was placed. IMPRESSION: CT-guided placement of a 12 French drain within the collection in the posterior right lower chest. Findings are concerning for a lung abscess or empyema. Electronically Signed   By: Richarda Overlie M.D.   On: 01/25/2019 17:42   Ir Paracentesis  Result Date: 01/23/2019 INDICATION: Patient with history of alcoholic cirrhosis with recurrent ascites. Request is made for diagnostic and therapeutic paracentesis. EXAM: ULTRASOUND GUIDED DIAGNOSTIC AND THERAPEUTIC PARACENTESIS MEDICATIONS: 10 mL 1% lidocaine COMPLICATIONS: None immediate. PROCEDURE: Informed written consent was obtained from the patient after a discussion of the risks, benefits and alternatives to treatment. A timeout was performed prior to the initiation of the procedure. Initial ultrasound scanning demonstrates a moderate amount of ascites within the right lower abdominal quadrant. The right lower abdomen was prepped and draped in the usual sterile fashion. 1% lidocaine was used for local anesthesia. Following this, a 19 gauge, 7-cm, Yueh catheter was introduced. An ultrasound image was saved for documentation purposes. The paracentesis was performed. The catheter was removed and a dressing was applied. The patient tolerated the procedure well without immediate post procedural complication. FINDINGS: A total of approximately 2.2 L of clear yellow fluid was removed. Samples were sent to the laboratory as requested by the clinical team. IMPRESSION: Successful ultrasound-guided paracentesis yielding 2.2 L of peritoneal fluid. Read by: Elwin Mocha, PA-C Electronically Signed   By: Richarda Overlie M.D.   On: 01/23/2019 15:46     CBC Recent Labs  Lab 01/21/19 1536  01/23/19 0627  01/24/19 0447 01/25/19 0556 01/26/19 0926 01/27/19 0351  WBC 17.0*   < > 29.9* 16.1* 14.0* 13.2* 10.7*  HGB 11.8*   < > 10.1* 8.4* 7.5* 8.8* 8.2*  HCT 33.2*   < > 28.6* 24.1* 21.9* 25.7* 24.5*  PLT 171   < > 125* 99* 85* 91* 86*  MCV 96.0   < > 97.9 97.6 99.5 100.8* 101.2*  MCH 34.1*   < > 34.6* 34.0 34.1* 34.5* 33.9  MCHC 35.5   < > 35.3 34.9 34.2 34.2 33.5  RDW 14.0   < > 14.7 14.9 15.6* 16.0* 16.7*  LYMPHSABS 1.5  --  1.3 0.5*  --   --   --   MONOABS 1.3*  --  1.6* 0.7  --   --   --   EOSABS 0.1  --  0.1 0.1  --   --   --   BASOSABS 0.1  --  0.1 0.1  --   --   --    < > = values in this interval not displayed.    Chemistries  Recent Labs  Lab 01/22/19 0049 01/23/19 0627 01/24/19 0447 01/25/19 0556 01/26/19 0926 01/27/19 0351  NA 122* 124* 124* 129* 130* 131*  K 3.4* 4.2 4.9 4.4 4.4 4.5  CL 89* 93* 96* 101 101 102  CO2 20* 21* 17* 17* 18* 19*  GLUCOSE 104* 110* 87 103* 109* 98  BUN 5* 9 16 21* 20 18  CREATININE 0.64 1.76* 1.47* 1.37* 1.21 1.08  CALCIUM 8.0* 8.0* 8.6* 9.3 9.8 9.6  MG  --  1.1* 2.0  --   --   --   AST 129* 124* 80* 56* 61*  --   ALT 40 40 --   ALKPHOS 154* 162* 105 94 106  --   BILITOT 3.1* 4.7* 6.7* 6.9* 8.5*  --    ------------------------------------------------------------------------------------------------------------------ No results for input(s): CHOL, HDL, LDLCALC, TRIG, CHOLHDL, LDLDIRECT in the last 72 hours.  No results found for: HGBA1C ------------------------------------------------------------------------------------------------------------------ No results for input(s): TSH, T4TOTAL, T3FREE, THYROIDAB in the last 72 hours.  Invalid input(s): FREET3 ------------------------------------------------------------------------------------------------------------------ No results for input(s): VITAMINB12, FOLATE, FERRITIN, TIBC, IRON, RETICCTPCT in the last 72 hours.  Coagulation profile Recent Labs  Lab 01/24/19 0447  01/24/19 1424 01/25/19 0556 01/26/19 0424 01/27/19 0351  INR 2.7* 2.2* 1.9* 1.6* 1.6*    No results for input(s): DDIMER in the last 72 hours.  Cardiac Enzymes No results for input(s): CKMB, TROPONINI, MYOGLOBIN in the last 168 hours.  Invalid input(s): CK ------------------------------------------------------------------------------------------------------------------    Component Value Date/Time   BNP 318.0 (H) 01/21/2019 1648   Shon Hale M.D on 01/27/2019 at 1:55 PM  Go to [email or web address] - for contact info  [facility] - Office  [phone]

## 2019-01-27 NOTE — Progress Notes (Signed)
IR Rounding Note S/p (R)pigtail chest tube yesterday. Feeling a little better Tube intact, site clean Output serosanguinous, 50 mL recorded past 24 hrs No air leak. CXR improved. Plans per CTS.  Brayton El PA-C Interventional Radiology 01/27/2019 10:42 AM

## 2019-01-28 ENCOUNTER — Inpatient Hospital Stay (HOSPITAL_COMMUNITY): Payer: Medicaid - Out of State

## 2019-01-28 LAB — CULTURE, BODY FLUID W GRAM STAIN -BOTTLE: Culture: NO GROWTH

## 2019-01-28 LAB — AMMONIA: Ammonia: 57 umol/L — ABNORMAL HIGH (ref 9–35)

## 2019-01-28 LAB — PROTIME-INR
INR: 1.4 — ABNORMAL HIGH (ref 0.8–1.2)
Prothrombin Time: 17.1 seconds — ABNORMAL HIGH (ref 11.4–15.2)

## 2019-01-28 MED ORDER — TRAMADOL HCL 50 MG PO TABS
50.0000 mg | ORAL_TABLET | Freq: Once | ORAL | Status: AC
  Administered 2019-01-28: 50 mg via ORAL
  Filled 2019-01-28: qty 1

## 2019-01-28 MED ORDER — PROPRANOLOL HCL 10 MG PO TABS
10.0000 mg | ORAL_TABLET | Freq: Three times a day (TID) | ORAL | Status: DC
  Administered 2019-01-28 – 2019-02-02 (×13): 10 mg via ORAL
  Filled 2019-01-28 (×20): qty 1

## 2019-01-28 NOTE — Progress Notes (Signed)
Patient Demographics:    Grant [name], is a 38 y.o. male, DOB - 1981/05/26, ZOX:[id number]  Admit date - 01/21/2019   Admitting Physician Glade Lloyd, MD  Outpatient Primary MD for the patient is Patient, No Pcp Per  LOS - 7  Chief Complaint  Patient presents with   Shortness of Breath        Subjective:    Grant [name] today has no fevers, no emesis, patient had episode of nosebleed, now stopped  Assessment  & Plan :    Principal Problem:   Acute respiratory failure with hypoxia (HCC) Active Problems:   Alcoholic cirrhosis of liver with ascites (HCC)   Esophageal reflux   Morbid obesity due to excess calories (HCC)   Hypokalemia   Ascites   RLL pneumonia (HCC)   Empyema of right pleural space (HCC)   Hyponatremia   Supratherapeutic INR   Lactic acidosis  Brief Summary 38 year old male with history of alcoholic cirrhosis, portal vein thrombosis diagnosed in 06/2018 for which he is currently on Xarelto, severe microcytic anemia, GERD, splenomegaly presented with shortness of breath and cough worsening over the past 3 weeks, with COVID-19 testing done as an outpatient 3 weeks PTA apparently negative, repeat COVID test on 01/21/2019 at our facility was also negative. Admitted on 01/21/2027 right lower lobe pneumonia with loculated pleural fluid, likely empyema Versus Abscess.  He was also found to have elevated INR along with hyponatremia.  CT surgery was consulted and recommends percutaneous drainage by interventional radiology, s/p Rt pleural fluid drain/chest tube placement on 01/25/2019  A/p 1)Rt LL E  Coli pneumonia with loculated pleural fluid/Empyema Versus Abscess.---- s/p  percutaneous drainage by IR on 01/25/2019, vancomycin discontinued on 01/23/2019, pleural fluid culture from 01/25/2019 growing pansensitive E. coli, switched from IV cefepime to IV Rocephin starting 01/27/2019 , strep pneumo  antigen negative ,   WBC is down to 10.7 from a peak of 16.1 -- Interventional radiology and CT surgery helping to manage chest tube- S/p (R)pigtail chest tube -output serosanguinous, No air leak. Follow-up CXR improved.  CT surgery... May put a tube on 01/29/2019  2)SBP--- paracentesis fluid from 01/23/2019 with 258 ANC consistent with SBP,switched from IV cefepime to IV Rocephin starting 01/27/2019  , paracentesis fluid cx NGTD  3)Supratherapeutic INR--- liver disease with some degree of auto anticoagulation--- PTA was on Xarelto for portal vein thrombosis, patient received vitamin K, INR 1.4  4)Alcoholic liver cirrhosis with ascites--- status post paracentesis on 01/23/2019--- denies recent alcohol use, currently on Lasix, continue Aldactone at 12.5 mg.. Continue folic acid and MVI.  C/n  lactulose 10 g daily..... No evidence of encephalopathy at this time, serum ammonia is 57  5)FEN/hypokalemia/hypomagnesemia//hyponatremia--- continue to replace potassium and magnesium, low sodium is probably from hypervolemic state/hemodilution, continue with IV Lasix and fluid restriction  6)AKI--- possibly contrast induced ATN, nephrology consult appreciated,, ultrasound without significant abnormalities...., Creatinine baseline was 0.6, creatinine peak was 1.76, creatinine is now down to 1.0.Marland KitchenMarland Kitchen Continue to avoid nephrotoxic agents  7)Chronic Thrombocytopenia/chronic Anemia-----most likely due to underlying liver disease/cirrhosis, no evidence of ongoing bleeding,  Hgb is down to 8.2, platelet count is 86, continue to monitor--no indication for transfusion at this time  Disposition/Need for in-Hospital Stay- patient unable to be discharged  at this time due to right lung empyema/abscess requiring percutaneous drainage/tube placement and IV antibiotics , as per cardiothoracic surgeon may be able to discontinue chest tube on 01/29/2019  Code Status : full  Family Communication:   na  Disposition Plan  : Possibly  home when medically improved  Procedure: 01/25/19  CT guided chest drain placement Findings: Thick wall collection in right posterior lower chest.  Thick bloody purulent fluid aspirated, approximately 100 ml.  12 Fr drain placed.  Thick bloody fluid draining at end of procedure.   Consults  : CT surgery/IR  DVT Prophylaxis  :  - SCDs/Xarelto on hold  Lab Results  Component Value Date   PLT 86 (L) 01/27/2019   Inpatient Medications  Scheduled Meds:  folic acid  1 mg Oral Daily   furosemide  40 mg Intravenous TID   lactulose  10 g Oral Daily   magnesium oxide  400 mg Oral Daily   mouth rinse  15 mL Mouth Rinse BID   multivitamin with minerals  1 tablet Oral Daily   pantoprazole  40 mg Oral Daily   potassium chloride  40 mEq Oral BID   propranolol  10 mg Oral TID   spironolactone  12.5 mg Oral Daily   thiamine  100 mg Oral Daily   Continuous Infusions:  cefTRIAXone (ROCEPHIN)  IV 2 g (01/27/19 1730)   PRN Meds:.benzonatate, guaiFENesin-dextromethorphan    Anti-infectives (From admission, onward)   Start     Dose/Rate Route Frequency Ordered Stop   01/27/19 1600  cefTRIAXone (ROCEPHIN) 2 g in sodium chloride 0.9 % 100 mL IVPB     2 g 200 mL/hr over 30 Minutes Intravenous Every 24 hours 01/27/19 1345     01/22/19 1000  ceFEPIme (MAXIPIME) 2 g in sodium chloride 0.9 % 100 mL IVPB  Status:  Discontinued     2 g 200 mL/hr over 30 Minutes Intravenous Every 8 hours 01/22/19 0904 01/27/19 1345   01/22/19 0400  vancomycin (VANCOCIN) IVPB 1000 mg/200 mL premix  Status:  Discontinued     1,000 mg 200 mL/hr over 60 Minutes Intravenous Every 12 hours 01/21/19 1820 01/23/19 0855   01/21/19 1800  vancomycin (VANCOCIN) IVPB 1000 mg/200 mL premix     1,000 mg 200 mL/hr over 60 Minutes Intravenous Every 1 hr x 2 01/21/19 1703 01/21/19 2042   01/21/19 1645  ceFEPIme (MAXIPIME) 1 g in sodium chloride 0.9 % 100 mL IVPB     1 g 200 mL/hr over 30 Minutes Intravenous  Once  01/21/19 1633 01/21/19 1802        Objective:   Vitals:   01/27/19 0801 01/27/19 1803 01/27/19 2359 01/28/19 0817  BP: 103/65 118/71 (!) 113/59 (!) 110/51  Pulse: (!) 115 (!) 119 (!) 116 (!) 134  Resp: Temp: 97.6 F (36.4 C) 98.6 F (37 C) 99 F (37.2 C)   TempSrc: Oral Oral Oral   SpO2: 90% 93% 92% 93%  Weight:      Height:        Wt Readings from Last 3 Encounters:  01/26/19 99 kg  11/04/18 100.5 kg  10/11/18 100.7 kg     Intake/Output Summary (Last 24 hours) at 01/28/2019 1428 Last data filed at 01/28/2019 0600 Gross per 24 hour  Intake --  Output 1390 ml  Net -1390 ml   Physical Exam Patient is examined daily including today on 01/28/19 , exams remain the same as of yesterday except that  has changed   Gen:- Awake Alert, in no acute distress HEENT:- [place].AT, No sclera icterus Neck-Supple Neck,No JVD,.  Lungs-diminished on right with scattered rhonchi, right-sided chest tube/drain CV- S1, S2 normal, regular  Abd-umbilical hernia, +ve Ascites, +ve abdominal distention, no significant tenderness Extremity/Skin:- 2 +ve edema, pedal pulses present  Psych-affect is appropriate, oriented x3 Neuro-no new focal deficits,     Data Review:   Micro Results Recent Results (from the past 240 hour(s))  SARS Coronavirus 2 [facility] order, Performed in [facility] hospital lab)     Status: None   Collection Time: 01/21/19  3:58 PM  Result Value Ref Range Status   SARS Coronavirus 2 NEGATIVE NEGATIVE Final    Comment: (NOTE) If result is NEGATIVE SARS-CoV-2 target nucleic acids are NOT DETECTED. The SARS-CoV-2 RNA is generally detectable in upper and lower  respiratory specimens during the acute phase of infection. The lowest  concentration of SARS-CoV-2 viral copies this assay can detect is 250  copies / mL. A negative result does not preclude SARS-CoV-2 infection  and should not be used as the sole basis for treatment or other  patient management decisions.  A  negative result may occur with  improper specimen collection / handling, submission of specimen other  than nasopharyngeal swab, presence of viral mutation(s) within the  areas targeted by this assay, and inadequate number of viral copies  (<250 copies / mL). A negative result must be combined with clinical  observations, patient history, and epidemiological information. If result is POSITIVE SARS-CoV-2 target nucleic acids are DETECTED. The SARS-CoV-2 RNA is generally detectable in upper and lower  respiratory specimens dur ing the acute phase of infection.  Positive  results are indicative of active infection with SARS-CoV-2.  Clinical  correlation with patient history and other diagnostic information is  necessary to determine patient infection status.  Positive results do  not rule out bacterial infection or co-infection with other viruses. If result is PRESUMPTIVE POSTIVE SARS-CoV-2 nucleic acids MAY BE PRESENT.   A presumptive positive result was obtained on the submitted specimen  and confirmed on repeat testing.  While 2019 novel coronavirus  (SARS-CoV-2) nucleic acids may be present in the submitted sample  additional confirmatory testing may be necessary for epidemiological  and / or clinical management purposes  to differentiate between  SARS-CoV-2 and other Sarbecovirus currently known to infect humans.  If clinically indicated additional testing with an alternate test  methodology [phone]) is advised. The SARS-CoV-2 RNA is generally  detectable in upper and lower respiratory sp ecimens during the acute  phase of infection. The expected result is Negative. Fact Sheet for Patients:  BoilerBrush.com.cy Fact Sheet for Healthcare Providers: https://pope.com/ This test is not yet approved or cleared by the [place] FDA and has been authorized for detection and/or diagnosis of SARS-CoV-2 by FDA under an Emergency Use  Authorization (EUA).  This EUA will remain in effect (meaning this test can be used) for the duration of the COVID-19 declaration under Section 564(b)(1) of the Act, 21 U.S.C. section 360bbb-3(b)(1), unless the authorization is terminated or revoked sooner. Performed at [facility], [address] [place], [place] [address]   Culture, blood (Routine X 2) w Reflex to ID Panel     Status: None   Collection Time: 01/21/19  4:53 PM  Result Value Ref Range Status   Specimen Description   Final    LEFT ANTECUBITAL BOTTLES DRAWN AEROBIC AND ANAEROBIC   Special Requests Blood Culture adequate volume  Final   Culture   Final    NO GROWTH 5 DAYS Performed at [facility], [address] [place], [place] [address]    Report Status 01/26/2019 FINAL  Final  Culture, blood (Routine X 2) w Reflex to ID Panel     Status: None   Collection Time: 01/21/19  4:54 PM  Result Value Ref Range Status   Specimen Description BLOOD LEFT ARM BOTTLES DRAWN AEROBIC ONLY  Final   Special Requests Blood Culture adequate volume  Final   Culture   Final    NO GROWTH 5 DAYS Performed at [facility], [address] [place], [place] [address]    Report Status 01/26/2019 FINAL  Final  Urine Culture     Status: None   Collection Time: 01/21/19  5:31 PM  Result Value Ref Range Status   Specimen Description   Final    URINE, CLEAN CATCH Performed at [facility], [address], [place], [place] [address]    Special Requests   Final    NONE Performed at [facility], [address] [place], [place] [address]    Culture   Final    NO GROWTH Performed at [facility], [address] [place], [place] [address]    Report Status 01/23/2019 FINAL  Final  MRSA PCR Screening     Status: None   Collection Time: 01/22/19  1:00 AM  Result Value Ref Range Status   MRSA by PCR NEGATIVE NEGATIVE Final    Comment:        The GeneXpert MRSA Assay (FDA approved for NASAL specimens only), is one component of  a comprehensive MRSA colonization surveillance program. It is not intended to diagnose MRSA infection nor to guide or monitor treatment for MRSA infections. Performed at [facility], [address], [place], [place] [address]   Culture, sputum-assessment     Status: None   Collection Time: 01/22/19  5:02 AM  Result Value Ref Range Status   Specimen Description SPUTUM  Final   Special Requests Normal  Final   Sputum evaluation   Final    THIS SPECIMEN IS ACCEPTABLE FOR SPUTUM CULTURE Performed at [facility], [address], [place], [place] [address]    Report Status 01/22/2019 FINAL  Final  Culture, respiratory     Status: None   Collection Time: 01/22/19  5:02 AM  Result Value Ref Range Status   Specimen Description SPUTUM  Final   Special Requests Normal Reflexed from [id number]  Final   Gram Stain   Final    MODERATE WBC PRESENT, PREDOMINANTLY PMN FEW GRAM NEGATIVE RODS RARE GRAM POSITIVE COCCI IN PAIRS    Culture   Final    Consistent with normal respiratory flora. Performed at [facility], [address], [place], [place] [address]    Report Status 01/24/2019 FINAL  Final  Gram stain     Status: None   Collection Time: 01/23/19  3:18 PM  Result Value Ref Range Status   Specimen Description FLUID PERITONEAL  Final   Special Requests NONE  Final   Gram Stain   Final    RARE WBC PRESENT,BOTH PMN AND MONONUCLEAR NO ORGANISMS SEEN Performed at [facility], [address], [place], [place] [address]    Report Status 01/23/2019 FINAL  Final  Culture, body fluid-bottle     Status: None   Collection Time: 01/23/19  3:18 PM  Result Value Ref Range  Status   Specimen Description FLUID PERITONEAL  Final   Special Requests BOTTLES DRAWN AEROBIC AND ANAEROBIC 10CC  Final   Culture   Final    NO GROWTH 5 DAYS Performed at [facility], [address] [place], [place] [address]    Report Status 01/28/2019 FINAL  Final  Aerobic/Anaerobic  Culture (surgical/deep wound)     Status: None (Preliminary result)   Collection Time: 01/25/19  3:57 PM  Result Value Ref Range Status   Specimen Description LUNG RIGHT PLEURAL  Final   Special Requests NONE  Final   Gram Stain   Final    ABUNDANT WBC PRESENT,BOTH PMN AND MONONUCLEAR ABUNDANT GRAM NEGATIVE RODS Performed at [facility], [address] [place], [place] [address]    Culture   Final    MODERATE ESCHERICHIA COLI NO ANAEROBES ISOLATED; CULTURE IN PROGRESS FOR 5 DAYS    Report Status PENDING  Incomplete   Organism ID, Bacteria ESCHERICHIA COLI  Final      Susceptibility   Escherichia coli - MIC*    AMPICILLIN 4 SENSITIVE Sensitive     CEFAZOLIN <=4 SENSITIVE Sensitive     CEFEPIME <=1 SENSITIVE Sensitive     CEFTAZIDIME <=1 SENSITIVE Sensitive     CEFTRIAXONE <=1 SENSITIVE Sensitive     CIPROFLOXACIN <=0.25 SENSITIVE Sensitive     GENTAMICIN <=1 SENSITIVE Sensitive     IMIPENEM <=0.25 SENSITIVE Sensitive     TRIMETH/SULFA >=320 RESISTANT Resistant     AMPICILLIN/SULBACTAM <=2 SENSITIVE Sensitive     PIP/TAZO <=4 SENSITIVE Sensitive     Extended ESBL NEGATIVE Sensitive     * MODERATE ESCHERICHIA COLI    Radiology Reports Ct Chest Wo Contrast  Result Date: 01/21/2019 CLINICAL DATA:  Right lower lobe opacity and complex pleural fluid seen on prior abdominal CT. EXAM: CT CHEST WITHOUT CONTRAST TECHNIQUE: Multidetector CT imaging of the chest was performed following the standard protocol without IV contrast. COMPARISON:  CT abdomen 01/21/2019 FINDINGS: Cardiovascular: Heart is normal size. Aorta is normal caliber. Coronary artery calcification in the left anterior descending coronary artery. Mediastinum/Nodes: No mediastinal, hilar, or axillary adenopathy. Lungs/Pleura: Dense consolidation noted in the right lower lobe compatible with pneumonia. Loculated pleural fluid noted along the right heart border and superior laterally in the right hemithorax. There is a  rounded fluid collection posteriorly in the right lower hemithorax measuring 7.2 cm concerning for empyema or pulmonary abscess. On coronal imaging, this extends over a craniocaudal length of 10 cm. Dependent atelectasis in the left lower lobe. Upper Abdomen: Changes of cirrhosis with ascites. Musculoskeletal: No acute bony abnormality. Chest wall soft tissues are unremarkable. IMPRESSION: Dense consolidation in the right lower lobe compatible with pneumonia. Loculated right pleural fluid. Rounded fluid collection posteriorly in the right lower hemithorax which could be loculated fluid and reflect empyema or pulmonary abscess. Coronary artery disease. Cirrhosis, ascites. Electronically Signed   By: Charlett NoseKevin  Dover M.D.   On: 01/21/2019 20:28   Ct Abdomen Pelvis W Contrast  Result Date: 01/21/2019 CLINICAL DATA:  Coughing since January. History of thrombocytopenia, splenomegaly and cirrhosis. Recently diagnosed with pneumonia. EXAM: CT ABDOMEN AND PELVIS WITH CONTRAST TECHNIQUE: Multidetector CT imaging of the abdomen and pelvis was performed using the standard protocol following bolus administration of intravenous contrast. CONTRAST:  100mL OMNIPAQUE IOHEXOL 300 MG/ML  SOLN COMPARISON:  Chest radiographs today. Abdominopelvic CT 05/08/2016. Right upper quadrant ultrasound 06/30/2018. FINDINGS: Lower chest: The lung bases are incompletely visualized. There is consolidation of  the visualized right lower lobe with a large loculated right pleural effusion. This has a component posteriorly with thick peripheral enhancement, measuring up to 7.9 x 5.2 cm on image 9/2. Based on the provided history, this is suspicious for empyema and possible pleural-based abscess. There is minimal atelectasis at the left lung base and no significant left pleural effusion. The azygos and hemi azygous veins are mildly dilated. Hepatobiliary: There are progressive changes of cirrhosis throughout the liver with diffuse contour irregularity  and parenchymal heterogeneity. No dominant mass or focally abnormal enhancement identified. The portal vein is patent. No evidence of gallstones, gallbladder wall thickening or biliary dilatation. Pancreas: Unremarkable. No pancreatic ductal dilatation or surrounding inflammatory changes. Spleen: Measures approximately 14.7 x 7.5 x 12.3 cm (volume = 710 cm^3) consistent with mild splenomegaly. No focal abnormality. Adrenals/Urinary Tract: Both adrenal glands appear normal. The kidneys, ureters and bladder appear normal. Stomach/Bowel: No evidence of bowel wall thickening, distention or surrounding inflammatory change. There is a large amount of stool within the rectum. There is a probable loop of small bowel extending into an umbilical hernia. No evidence of bowel obstruction or incarceration. Vascular/Lymphatic: There are no enlarged abdominal or pelvic lymph nodes. Minimal aortic atherosclerosis. No acute vascular findings are identified. 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 The portal, superior mesenteric and splenic veins appear patent. Reproductive: The prostate gland and seminal vesicles appear normal. Other: Interval development of a moderate amount of ascites. Fluid extends into an umbilical hernia which measures up to 8.6 cm transverse on image 73/2. As above, there is air within this hernia as well which appears to be due to a small knuckle of small bowel. Musculoskeletal: No acute osseous findings. There are old rib fractures on the left. There are changes of bilateral femoral head avascular necrosis with early subchondral collapse on the right. There is edema throughout the subcutaneous fat. In the upper right buttocks, there is a globular area of increased signal which could be related to prior trauma/injection or reflect fat necrosis. IMPRESSION: 1. Right lower lobe pulmonary consolidation with complex fluid in the right pleural space, worrisome for empyema and  pleural-based abscess. Full chest CT may be helpful for further evaluation. 2. Significantly progressive changes of diffuse hepatic cirrhosis compared with previous CT from 2017. Mild changes of portal hypertension with splenomegaly and venous collaterals. The main portal vein appears patent. 3. Progressive moderate volume ascites. Ascites and a small bowel loop extend into an umbilical hernia. No evidence of bowel obstruction or incarceration. 4. Chronic bilateral femoral head avascular necrosis. Electronically Signed   By: Carey Bullocks M.D.   On: 01/21/2019 19:37   US Renal  Result Date: 01/23/2019 CLINICAL DATA:  Renal failure EXAM: RENAL / URINARY TRACT ULTRASOUND COMPLETE COMPARISON:  None. FINDINGS: Right Kidney: Renal measurements: 12.4 x 6.8 x 6.4 cm = volume: 286 mL . Echogenicity within normal limits. No mass or hydronephrosis visualized. Left Kidney: Renal measurements: 11.8 x 6 x 6.5 cm = volume: 239 mL. Echogenicity within normal limits. No mass or hydronephrosis visualized. Bladder: Appears normal for degree of bladder distention. Other: Increased hepatic echogenicity with a slightly nodular contour as can be seen with hepatic cirrhosis. Small amount of perihepatic ascites. Splenomegaly with the spleen measuring 18.5 cm in length and a volume of 1535 mL. IMPRESSION: 1. Normal renal ultrasound. 2. Increased hepatic echogenicity with a slightly nodular contour as can be seen with hepatic cirrhosis. Small volume perihepatic ascites. Splenomegaly. Correlate with liver function  test. Electronically Signed   By: Elige Ko   On: 01/23/2019 10:06   Dg Chest Port 1 View  Result Date: 01/28/2019 CLINICAL DATA:  Empyema of pleural space EXAM: PORTABLE CHEST 1 VIEW COMPARISON:  Yesterday FINDINGS: Pigtail catheter over the right base is unchanged. Unchanged pleural fluid and parenchymal opacity. Comparatively clear left chest. Normal heart size. IMPRESSION: Unchanged pleuroparenchymal opacity at the  right base. Electronically Signed   By: Marnee Spring M.D.   On: 01/28/2019 09:24   Dg Chest Port 1 View  Result Date: 01/27/2019 CLINICAL DATA:  Empyema. EXAM: PORTABLE CHEST 1 VIEW COMPARISON:  Radiograph of January 26, 2019. FINDINGS: The heart size and mediastinal contours are within normal limits. Left lung is clear. Stable position of right-sided pleural drainage catheter is noted. Right pleural effusion is unchanged. There is associated atelectasis in right lung base. No pneumothorax is noted. The visualized skeletal structures are unremarkable. IMPRESSION: Stable position of right-sided pleural drainage catheter with stable right pleural effusion and associated atelectasis. Electronically Signed   By: Lupita Raider M.D.   On: 01/27/2019 07:13   Dg Chest Port 1 View  Result Date: 01/26/2019 CLINICAL DATA:  Chest tube EXAM: PORTABLE CHEST 1 VIEW COMPARISON:  01/21/2019 FINDINGS: Right basilar chest tube is now in place. Right pleural effusion and basilar pulmonary opacity improved. Increasing small left pleural effusion. Upper lungs clear. Cardiomegaly. No pneumothorax. IMPRESSION: Improved right pleural effusion after chest tube placement. New small left pleural effusion. Electronically Signed   By: Jolaine Click M.D.   On: 01/26/2019 08:00   Dg Chest Portable 1 View  Result Date: 01/21/2019 CLINICAL DATA:  38 year old with current history of hepatic cirrhosis and, by the patient's report, recent diagnosis of pneumonia at an outside facility. EXAM: PORTABLE CHEST 1 VIEW COMPARISON:  02/21/2018 and earlier, including CTA chest 05/13/2015. FINDINGS: Cardiac silhouette moderately to markedly enlarged, unchanged since 2019 but increased in size since 2016. Pulmonary venous hypertension with perhaps minimal interstitial pulmonary edema. Large RIGHT pleural effusion and associated consolidation in the RIGHT LOWER LOBE. Lungs otherwise clear. No visible LEFT pleural effusion. IMPRESSION: 1. Large RIGHT  pleural effusion and associated passive atelectasis and/or pneumonia involving the RIGHT LOWER LOBE. 2. Stable moderate to marked cardiomegaly. Pulmonary venous hypertension with perhaps minimal interstitial pulmonary edema indicating CHF and/or fluid overload. Electronically Signed   By: Hulan Saas M.D.   On: 01/21/2019 16:25   Ct Image Guided Fluid Drain By Catheter  Result Date: 01/25/2019 INDICATION: 38 year old with right lung pneumonia, loculated right pleural fluid and a focal collection in the posterior lower chest. Findings are concerning for an empyema or lung abscess. Plan for percutaneous drainage of this posterior right chest fluid collection. EXAM: CT-GUIDED PLACEMENT OF RIGHT CHEST TUBE MEDICATIONS: No antibiotics were given for this procedure. ANESTHESIA/SEDATION: Fentanyl 50 mcg IV; Versed 1.5 mg IV Moderate Sedation Time:  41 minutes The patient was continuously monitored during the procedure by the interventional radiology nurse under my direct supervision. COMPLICATIONS: None immediate. PROCEDURE: Informed written consent was obtained from the patient after a thorough discussion of the procedural risks, benefits and alternatives. All questions were addressed. Maximal Sterile Barrier Technique was utilized including caps, mask, sterile gowns, sterile gloves, sterile drape, hand hygiene and skin antiseptic. A timeout was performed prior to the initiation of the procedure. Patient was placed prone. CT images through the lower chest were obtained. The right side of the chest was prepped and draped in sterile fashion. Skin and soft  tissues anesthetized with 1% lidocaine. Using CT guidance, a Yueh catheter was directed into the right posterior lower chest fluid collection. Initially, thick yellow fluid was aspirated. Stiff Amplatz wire was advanced into the collection and the patient started to cough and became symptomatic. The wire was removed for the Yueh catheter again. Additional bloody  fluid was removed from the chest collection. Eventually, the patient's coughing stopped. Subsequently, Yueh catheter was removed over a stiff Amplatz wire and the percutaneous tract was dilated to accommodate a 12 [place] drain. Approximately 100 mL of thick bloody purulent fluid was removed from the chest collection. The tube was starting to clot and the tube was irrigated with normal saline. The tube was attached to a PleurEvac and wall suction. Additional bloody fluid was removed. Catheter was sutured to skin and a bandage was placed. FINDINGS: Again noted is a complex fluid collection in the posteromedial right lower chest adjacent to right lung consolidation. Catheter was directed into the fluid collection and thick bloody purulent fluid was removed. 12 French drain was placed. IMPRESSION: CT-guided placement of a 12 French drain within the collection in the posterior right lower chest. Findings are concerning for a lung abscess or empyema. Electronically Signed   By: Richarda Overlie M.D.   On: 01/25/2019 17:42   Ir Paracentesis  Result Date: 01/23/2019 INDICATION: Patient with history of alcoholic cirrhosis with recurrent ascites. Request is made for diagnostic and therapeutic paracentesis. EXAM: ULTRASOUND GUIDED DIAGNOSTIC AND THERAPEUTIC PARACENTESIS MEDICATIONS: 10 mL 1% lidocaine COMPLICATIONS: None immediate. PROCEDURE: Informed written consent was obtained from the patient after a discussion of the risks, benefits and alternatives to treatment. A timeout was performed prior to the initiation of the procedure. Initial ultrasound scanning demonstrates a moderate amount of ascites within the right lower abdominal quadrant. The right lower abdomen was prepped and draped in the usual sterile fashion. 1% lidocaine was used for local anesthesia. Following this, a 19 gauge, 7-cm, Yueh catheter was introduced. An ultrasound image was saved for documentation purposes. The paracentesis was performed. The catheter  was removed and a dressing was applied. The patient tolerated the procedure well without immediate post procedural complication. FINDINGS: A total of approximately 2.2 L of clear yellow fluid was removed. Samples were sent to the laboratory as requested by the clinical team. IMPRESSION: Successful ultrasound-guided paracentesis yielding 2.2 L of peritoneal fluid. Read by: Elwin Mocha, PA-C Electronically Signed   By: Richarda Overlie M.D.   On: 01/23/2019 15:46     CBC Recent Labs  Lab 01/21/19 1536  01/23/19 0627 01/24/19 0447 01/25/19 0556 01/26/19 0926 01/27/19 0351  WBC 17.0*   < > 29.9* 16.1* 14.0* 13.2* 10.7*  HGB 11.8*   < > 10.1* 8.4* 7.5* 8.8* 8.2*  HCT 33.2*   < > 28.6* 24.1* 21.9* 25.7* 24.5*  PLT 171   < > 125* 99* 85* 91* 86*  MCV 96.0   < > 97.9 97.6 99.5 100.8* 101.2*  MCH 34.1*   < > 34.6* 34.0 34.1* 34.5* 33.9  MCHC 35.5   < > 35.3 34.9 34.2 34.2 33.5  RDW 14.0   < > 14.7 14.9 15.6* 16.0* 16.7*  LYMPHSABS 1.5  --  1.3 0.5*  --   --   --   MONOABS 1.3*  --  1.6* 0.7  --   --   --   EOSABS 0.1  --  0.1 0.1  --   --   --   BASOSABS 0.1  --  0.1 0.1  --   --   --    < > = values in this interval not displayed.    Chemistries  Recent Labs  Lab 01/22/19 0049 01/23/19 0627 01/24/19 0447 01/25/19 0556 01/26/19 0926 01/27/19 0351  NA 122* 124* 124* 129* 130* 131*  K 3.4* 4.2 4.9 4.4 4.4 4.5  CL 89* 93* 96* 101 101 102  CO2 20* 21* 17* 17* 18* 19*  GLUCOSE 104* 110* 87 103* 109* 98  BUN 5* 9 16 21* 20 18  CREATININE 0.64 1.76* 1.47* 1.37* 1.21 1.08  CALCIUM 8.0* 8.0* 8.6* 9.3 9.8 9.6  MG  --  1.1* 2.0  --   --   --   AST 129* 124* 80* 56* 61*  --   ALT 40 40 30 24 26   --   ALKPHOS 154* 162* 105 94 106  --   BILITOT 3.1* 4.7* 6.7* 6.9* 8.5*  --    ------------------------------------------------------------------------------------------------------------------ No results for input(s): CHOL, HDL, LDLCALC, TRIG, CHOLHDL, LDLDIRECT in the last 72 hours.  No  results found for: HGBA1C ------------------------------------------------------------------------------------------------------------------ No results for input(s): TSH, T4TOTAL, T3FREE, THYROIDAB in the last 72 hours.  Invalid input(s): FREET3 ------------------------------------------------------------------------------------------------------------------ No results for input(s): VITAMINB12, FOLATE, FERRITIN, TIBC, IRON, RETICCTPCT in the last 72 hours.  Coagulation profile Recent Labs  Lab 01/24/19 1424 01/25/19 0556 01/26/19 0424 01/27/19 0351 01/28/19 0454  INR 2.2* 1.9* 1.6* 1.6* 1.4*    No results for input(s): DDIMER in the last 72 hours.  Cardiac Enzymes No results for input(s): CKMB, TROPONINI, MYOGLOBIN in the last 168 hours.  Invalid input(s): CK ------------------------------------------------------------------------------------------------------------------    Component Value Date/Time   BNP 318.0 (H) 01/21/2019 1648   Shon Hale M.D on 01/28/2019 at 2:28 PM  Go to [email or web address] - for contact info  [facility] - Office  [phone]

## 2019-01-28 NOTE — Progress Notes (Addendum)
      [address]       [place] [address]             [phone]      Subjective:  No new complaints.    Objective: Vital signs in last 24 hours: Temp:  [98.6 F (37 C)-99 F (37.2 C)] 99 F (37.2 C) (05/01 2359) Pulse Rate:  [116-134] 134 (05/02 0817) Cardiac Rhythm: Other (Comment) (05/02 0700) Resp:  [18-20] 20 (05/01 2359) BP: (110-118)/(51-71) 110/51 (05/02 0817) SpO2:  [92 %-93 %] 93 % (05/02 0817)  Intake/Output from previous day: 05/01 0701 - 05/02 0700 In: 240 [P.O.:240] Out: 2340 [Urine:2250; Chest Tube:90]  General appearance: alert, cooperative and no distress, visibly jaundiced Heart: regular rate and rhythm Lungs: diminished breath sounds right base Extremities: extensive ecchymosis bilateral extremities, flank  Wound: clean and dry  Lab Results: Recent Labs    01/26/19 0926 01/27/19 0351  WBC 13.2* 10.7*  HGB 8.8* 8.2*  HCT 25.7* 24.5*  PLT 91* 86*   BMET:  Recent Labs    01/26/19 0926 01/27/19 0351  NA 130* 131*  K 4.4 4.5  CL 101 102  CO2 18* 19*  GLUCOSE 109* 98  BUN 20 18  CREATININE 1.21 1.08  CALCIUM 9.8 9.6    PT/INR:  Recent Labs    01/28/19 0454  LABPROT 17.1*  INR 1.4*   ABG No results found for: PHART, HCO3, TCO2, ACIDBASEDEF, O2SAT CBG (last 3)  Recent Labs    01/25/19 1130  GLUCAP 80    Assessment/Plan:  1. Lung abscess- CT with minimal output, has been flushed 2x- will await CXR and possibly d/c chest tube today vs tomorrow 2. ID- E. Coli from lung abscess, remains afebrile, continue ABX per primary 3. Dispo- care per primary, will review CXR possibly remove chest tube soon   LOS: 7 days    Erin Barrett 01/28/2019   Films reviewed, 90-100 ml out chest tube, plan removal tomorrow I have seen and examined Grant [name] and agree with the above assessment  and plan.  Delight Ovens MD [facility] [phone] Office [phone] 01/28/2019 10:47 AM

## 2019-01-29 ENCOUNTER — Inpatient Hospital Stay (HOSPITAL_COMMUNITY): Payer: Medicaid - Out of State

## 2019-01-29 LAB — CBC
HCT: 23.1 % — ABNORMAL LOW (ref 39.0–52.0)
Hemoglobin: 7.7 g/dL — ABNORMAL LOW (ref 13.0–17.0)
MCH: 34.2 pg — ABNORMAL HIGH (ref 26.0–34.0)
MCHC: 33.3 g/dL (ref 30.0–36.0)
MCV: 102.7 fL — ABNORMAL HIGH (ref 80.0–100.0)
Platelets: 111 10*3/uL — ABNORMAL LOW (ref 150–400)
RBC: 2.25 MIL/uL — ABNORMAL LOW (ref 4.22–5.81)
RDW: 17.7 % — ABNORMAL HIGH (ref 11.5–15.5)
WBC: 11.7 10*3/uL — ABNORMAL HIGH (ref 4.0–10.5)
nRBC: 0 % (ref 0.0–0.2)

## 2019-01-29 LAB — COMPREHENSIVE METABOLIC PANEL
ALT: 26 U/L (ref 0–44)
AST: 70 U/L — ABNORMAL HIGH (ref 15–41)
Albumin: 2.6 g/dL — ABNORMAL LOW (ref 3.5–5.0)
Alkaline Phosphatase: 97 U/L (ref 38–126)
Anion gap: 10 (ref 5–15)
BUN: 22 mg/dL — ABNORMAL HIGH (ref 6–20)
CO2: 19 mmol/L — ABNORMAL LOW (ref 22–32)
Calcium: 9.2 mg/dL (ref 8.9–10.3)
Chloride: 103 mmol/L (ref 98–111)
Creatinine, Ser: 1.29 mg/dL — ABNORMAL HIGH (ref 0.61–1.24)
GFR calc Af Amer: >60 mL/min (ref >60)
GFR calc non Af Amer: >60 mL/min (ref >60)
Glucose, Bld: 117 mg/dL — ABNORMAL HIGH (ref 70–99)
Potassium: 4.7 mmol/L (ref 3.5–5.1)
Sodium: 132 mmol/L — ABNORMAL LOW (ref 135–145)
Total Bilirubin: 6.1 mg/dL — ABNORMAL HIGH (ref 0.3–1.2)
Total Protein: 8.3 g/dL — ABNORMAL HIGH (ref 6.5–8.1)

## 2019-01-29 LAB — PROTIME-INR
INR: 1.5 — ABNORMAL HIGH (ref 0.8–1.2)
Prothrombin Time: 17.5 seconds — ABNORMAL HIGH (ref 11.4–15.2)

## 2019-01-29 NOTE — Progress Notes (Addendum)
Per Dr. Tyrone Sage, ready for chest tube removal today. Minimal output past 24-36 hrs. CXR stable BP (!) 110/55   Pulse 86   Temp 97.6 F (36.4 C) (Oral)   Resp 14   Ht 5\' 10"  (1.778 m)   Wt 99 kg   SpO2 93%   BMI 31.32 kg/m  (R)chest tube removed without difficulty. Profuse dark but brisk bleeding from site immediately after. Firm pressure held x 5 minutes to achieve hemostasis. Pressure dressing applied and pt instructed to lay on back to provide additional pressure.  Could be residual blood from abscess/cavity. Or could be intercostal venous bleed as pt with underlying cirrhosis and noted to have large engorged intercostal veins on CT imaging. D/w Dr. Lowella Dandy who came to bedside.  Stat CXR. Bedrest x 2 hrs.  Brayton El PA-C Interventional Radiology 01/29/2019 1:01 PM

## 2019-01-29 NOTE — Progress Notes (Signed)
      [address]       [place] [address]             [phone]                     LOS: 8 days   Subjective: Some cough   Objective: Vital signs in last 24 hours: Patient Vitals for the past 24 hrs:  BP Temp Temp src Pulse Resp SpO2  01/29/19 0900 (!) 110/55 - - - - -  01/29/19 0741 (!) 100/58 97.6 F (36.4 C) Oral 86 14 93 %  01/28/19 2315 (!) 110/59 98.1 F (36.7 C) Oral 94 - 95 %  01/28/19 1700 (!) 114/52 98.5 F (36.9 C) Oral (!) 110 - 95 %    Filed Weights   01/24/19 0500 01/25/19 0324 01/26/19 0500  Weight: 99.1 kg 99 kg 99 kg    Hemodynamic parameters for last 24 hours:    Intake/Output from previous day: 05/02 0701 - 05/03 0700 In: -  Out: 501 [Urine:500; Stool:1] Intake/Output this shift: Total I/O In: -  Out: 390 [Urine:100; Chest Tube:290]  Scheduled Meds: . folic acid  1 mg Oral Daily  . furosemide  40 mg Intravenous TID  . lactulose  10 g Oral Daily  . magnesium oxide  400 mg Oral Daily  . mouth rinse  15 mL Mouth Rinse BID  . multivitamin with minerals  1 tablet Oral Daily  . pantoprazole  40 mg Oral Daily  . potassium chloride  40 mEq Oral BID  . propranolol  10 mg Oral TID  . spironolactone  12.5 mg Oral Daily  . thiamine  100 mg Oral Daily   Continuous Infusions: . cefTRIAXone (ROCEPHIN)  IV 2 g (01/28/19 1754)   PRN Meds:.benzonatate, guaiFENesin-dextromethorphan  General appearance: alert and cooperative Neurologic: intact Lungs: diminished breath sounds bilaterally low darinag from chest tube , no air leak  Lab Results: CBC: Recent Labs    01/27/19 0351 01/29/19 0048  WBC 10.7* 11.7*  HGB 8.2* 7.7*  HCT 24.5* 23.1*  PLT 86* 111*   BMET:  Recent Labs    01/27/19 0351 01/29/19 0048  NA 131* 132*  K 4.5 4.7  CL 102 103  CO2 19* 19*  GLUCOSE 98 117*  BUN 18 22*  CREATININE 1.08 1.29*  CALCIUM 9.6 9.2    PT/INR:  Recent Labs    01/29/19 0048  LABPROT 17.5*  INR 1.5*     Radiology  Dg Chest Port 1 View  Result Date: 01/28/2019 CLINICAL DATA:  Empyema of pleural space EXAM: PORTABLE CHEST 1 VIEW COMPARISON:  Yesterday FINDINGS: Pigtail catheter over the right base is unchanged. Unchanged pleural fluid and parenchymal opacity. Comparatively clear left chest. Normal heart size. IMPRESSION: Unchanged pleuroparenchymal opacity at the right base. Electronically Signed   By: Marnee Spring M.D.   On: 01/28/2019 09:24     Assessment/Plan: D/c chest tube today  F/u chest xray in am   Delight Ovens MD 01/29/2019 11:46 AM

## 2019-01-29 NOTE — Progress Notes (Signed)
Patient Demographics:    Grant [name], is a 38 y.o. male, DOB - 20-Oct-1980, HYQ:[id number]  Admit date - 01/21/2019   Admitting Physician Glade Lloyd, MD  Outpatient Primary MD for the patient is Patient, No Pcp Per  LOS - 8  Chief Complaint  Patient presents with   Shortness of Breath        Subjective:    Grant [name] today has no fevers, no emesis, had bleeding from chest tube site after chest tube was removed,...... hemostasis achieved with pressure dressing  Assessment  & Plan :    Principal Problem:   Acute respiratory failure with hypoxia (HCC) Active Problems:   Alcoholic cirrhosis of liver with ascites (HCC)   Esophageal reflux   Morbid obesity due to excess calories (HCC)   Hypokalemia   Ascites   RLL pneumonia (HCC)   Empyema of right pleural space (HCC)   Hyponatremia   Supratherapeutic INR   Lactic acidosis  Brief Summary 38 year old male with history of alcoholic cirrhosis, portal vein thrombosis diagnosed in 06/2018 for which he is currently on Xarelto, severe microcytic anemia, GERD, splenomegaly presented with shortness of breath and cough worsening over the past 3 weeks, with COVID-19 testing done as an outpatient 3 weeks PTA apparently negative, repeat COVID test on 01/21/2019 at our facility was also negative. Admitted on 01/21/2027 right lower lobe pneumonia with loculated pleural fluid, likely empyema Versus Abscess.  He was also found to have elevated INR along with hyponatremia.  CT surgery was consulted and recommends percutaneous drainage by interventional radiology, s/p Rt pleural fluid drain/chest tube placement on 01/25/2019, chest tube removed 01/29/2019  A/p 1)Rt LL E  Coli pneumonia with loculated pleural fluid/Empyema Versus Abscess.---- s/p  percutaneous drainage by IR on 01/25/2019, vancomycin discontinued on 01/23/2019, pleural fluid culture from 01/25/2019 growing  pansensitive E. coli, switched from IV cefepime to IV Rocephin starting 01/27/2019 , strep pneumo antigen negative ,   WBC is down to 10.7 from a peak of 16.1 -- Interventional radiology and CT surgery helping to manage chest tube- S/p (R)pigtail chest tube on 01/25/19, pt had bleeding from chest tube site after chest tube was removed on 01/29/19,...... hemostasis achieved with pressure dressingk. Could be residual blood from abscess/cavity or bleeding from intercostal venous bleed as pt with underlying cirrhosis and noted to have large engorged intercostal veins on CT imaging. Follow-up CXR on 01/29/2019 improved, no pneumothorax   2)SBP--- paracentesis fluid from 01/23/2019 with 258 ANC consistent with SBP,switched from IV cefepime to IV Rocephin starting 01/27/2019  , paracentesis fluid cx NGTD  3)Supratherapeutic INR--- liver disease with some degree of auto anticoagulation--- PTA was on Xarelto for portal vein thrombosis, patient received vitamin K, INR 1.5  4)Alcoholic liver cirrhosis with ascites--- status post paracentesis on 01/23/2019--- denies recent alcohol use, currently on Lasix, continue Aldactone at 12.5 mg.. Continue folic acid and MVI.  C/n  lactulose 10 g daily..... No evidence of encephalopathy at this time, serum ammonia is 57  5)FEN/hypokalemia/hypomagnesemia//hyponatremia--- continue to replace potassium and magnesium, low sodium is probably from hypervolemic state/hemodilution, continue with IV Lasix and fluid restriction  6)AKI--- possibly contrast induced ATN, nephrology consult appreciated,, ultrasound without significant abnormalities...., Creatinine baseline was 0.6, creatinine peak was 1.76, creatinine is  now down to 1.0.Marland Kitchen.Marland Kitchen. Continue to avoid nephrotoxic agents  7)Chronic Thrombocytopenia/chronic Anemia-----most likely due to underlying liver disease/cirrhosis, no evidence of ongoing bleeding,  Hgb is down to 8.2, platelet count is 86, continue to monitor--no indication for  transfusion at this time  Disposition/Need for in-Hospital Stay- patient unable to be discharged at this time due to right lung empyema/abscess requiring percutaneous drainage/tube placement and IV antibiotics , as per cardiothoracic surgeon -pt had bleeding from chest tube site after chest tube was removed on 01/29/19--H&H is trending down monitor closely   Code Status : full  Family Communication:   na  Disposition Plan  : Possibly home when medically improved  Procedure: 01/25/19  CT guided chest drain placement  chest tube was removed on 01/29/19, Findings: Thick wall collection in right posterior lower chest.  Thick bloody purulent fluid aspirated, approximately 100 ml.  12 Fr drain placed.  Thick bloody fluid draining at end of procedure.   Consults  : CT surgery/IR  DVT Prophylaxis  :  - SCDs/Xarelto on hold  Lab Results  Component Value Date   PLT 111 (L) 01/29/2019   Inpatient Medications  Scheduled Meds:  folic acid  1 mg Oral Daily   furosemide  40 mg Intravenous TID   lactulose  10 g Oral Daily   magnesium oxide  400 mg Oral Daily   mouth rinse  15 mL Mouth Rinse BID   multivitamin with minerals  1 tablet Oral Daily   pantoprazole  40 mg Oral Daily   potassium chloride  40 mEq Oral BID   propranolol  10 mg Oral TID   spironolactone  12.5 mg Oral Daily   thiamine  100 mg Oral Daily   Continuous Infusions:  cefTRIAXone (ROCEPHIN)  IV 2 g (01/28/19 1754)   PRN Meds:.benzonatate, guaiFENesin-dextromethorphan    Anti-infectives (From admission, onward)   Start     Dose/Rate Route Frequency Ordered Stop   01/27/19 1600  cefTRIAXone (ROCEPHIN) 2 g in sodium chloride 0.9 % 100 mL IVPB     2 g 200 mL/hr over 30 Minutes Intravenous Every 24 hours 01/27/19 1345     01/22/19 1000  ceFEPIme (MAXIPIME) 2 g in sodium chloride 0.9 % 100 mL IVPB  Status:  Discontinued     2 g 200 mL/hr over 30 Minutes Intravenous Every 8 hours 01/22/19 0904 01/27/19 1345    01/22/19 0400  vancomycin (VANCOCIN) IVPB 1000 mg/200 mL premix  Status:  Discontinued     1,000 mg 200 mL/hr over 60 Minutes Intravenous Every 12 hours 01/21/19 1820 01/23/19 0855   01/21/19 1800  vancomycin (VANCOCIN) IVPB 1000 mg/200 mL premix     1,000 mg 200 mL/hr over 60 Minutes Intravenous Every 1 hr x 2 01/21/19 1703 01/21/19 2042   01/21/19 1645  ceFEPIme (MAXIPIME) 1 g in sodium chloride 0.9 % 100 mL IVPB     1 g 200 mL/hr over 30 Minutes Intravenous  Once 01/21/19 1633 01/21/19 1802        Objective:   Vitals:   01/28/19 1700 01/28/19 2315 01/29/19 0741 01/29/19 0900  BP: (!) 114/52 (!) 110/59 (!) 100/58 (!) 110/55  Pulse: (!) 110 94 86   Resp:   14   Temp: 98.5 F (36.9 C) 98.1 F (36.7 C) 97.6 F (36.4 C)   TempSrc: Oral Oral Oral   SpO2: 95% 95% 93%   Weight:      Height:        Wt Readings from  Last 3 Encounters:  01/26/19 99 kg  11/04/18 100.5 kg  10/11/18 100.7 kg     Intake/Output Summary (Last 24 hours) at 01/29/2019 1434 Last data filed at 01/29/2019 1000 Gross per 24 hour  Intake --  Output 891 ml  Net -891 ml   Physical Exam Patient is examined daily including today on 01/29/19 , exams remain the same as of yesterday except that has changed   Gen:- Awake Alert, in no acute distress HEENT:- [place].AT, No sclera icterus Neck-Supple Neck,No JVD,.  Lungs-improving air movement on the right chest tube removed  CV- S1, S2 normal, regular  Abd-umbilical hernia, +ve Ascites, +ve abdominal distention, no significant tenderness Extremity/Skin:- 2 +ve edema, pedal pulses present  Psych-affect is appropriate, oriented x3 Neuro-no new focal deficits,     Data Review:   Micro Results Recent Results (from the past 240 hour(s))  SARS Coronavirus 2 [facility] order, Performed in [facility] hospital lab)     Status: None   Collection Time: 01/21/19  3:58 PM  Result Value Ref Range Status   SARS Coronavirus 2 NEGATIVE NEGATIVE Final    Comment: (NOTE) If  result is NEGATIVE SARS-CoV-2 target nucleic acids are NOT DETECTED. The SARS-CoV-2 RNA is generally detectable in upper and lower  respiratory specimens during the acute phase of infection. The lowest  concentration of SARS-CoV-2 viral copies this assay can detect is 250  copies / mL. A negative result does not preclude SARS-CoV-2 infection  and should not be used as the sole basis for treatment or other  patient management decisions.  A negative result may occur with  improper specimen collection / handling, submission of specimen other  than nasopharyngeal swab, presence of viral mutation(s) within the  areas targeted by this assay, and inadequate number of viral copies  (<250 copies / mL). A negative result must be combined with clinical  observations, patient history, and epidemiological information. If result is POSITIVE SARS-CoV-2 target nucleic acids are DETECTED. The SARS-CoV-2 RNA is generally detectable in upper and lower  respiratory specimens dur ing the acute phase of infection.  Positive  results are indicative of active infection with SARS-CoV-2.  Clinical  correlation with patient history and other diagnostic information is  necessary to determine patient infection status.  Positive results do  not rule out bacterial infection or co-infection with other viruses. If result is PRESUMPTIVE POSTIVE SARS-CoV-2 nucleic acids MAY BE PRESENT.   A presumptive positive result was obtained on the submitted specimen  and confirmed on repeat testing.  While 2019 novel coronavirus  (SARS-CoV-2) nucleic acids may be present in the submitted sample  additional confirmatory testing may be necessary for epidemiological  and / or clinical management purposes  to differentiate between  SARS-CoV-2 and other Sarbecovirus currently known to infect humans.  If clinically indicated additional testing with an alternate test  methodology [phone]) is advised. The SARS-CoV-2 RNA is generally    detectable in upper and lower respiratory sp ecimens during the acute  phase of infection. The expected result is Negative. Fact Sheet for Patients:  BoilerBrush.com.cy Fact Sheet for Healthcare Providers: https://pope.com/ This test is not yet approved or cleared by the [place] FDA and has been authorized for detection and/or diagnosis of SARS-CoV-2 by FDA under an Emergency Use Authorization (EUA).  This EUA will remain in effect (meaning this test can be used) for the duration of the COVID-19 declaration under Section 564(b)(1) of the Act, 21 U.S.C. section 360bbb-3(b)(1), unless the authorization is terminated  or revoked sooner. Performed at [facility], [address] [place], [place] [address]   Culture, blood (Routine X 2) w Reflex to ID Panel     Status: None   Collection Time: 01/21/19  4:53 PM  Result Value Ref Range Status   Specimen Description   Final    LEFT ANTECUBITAL BOTTLES DRAWN AEROBIC AND ANAEROBIC   Special Requests Blood Culture adequate volume  Final   Culture   Final    NO GROWTH 5 DAYS Performed at [facility], [address] [place], [place] [address]    Report Status 01/26/2019 FINAL  Final  Culture, blood (Routine X 2) w Reflex to ID Panel     Status: None   Collection Time: 01/21/19  4:54 PM  Result Value Ref Range Status   Specimen Description BLOOD LEFT ARM BOTTLES DRAWN AEROBIC ONLY  Final   Special Requests Blood Culture adequate volume  Final   Culture   Final    NO GROWTH 5 DAYS Performed at [facility], [address] [place], [place] [address]    Report Status 01/26/2019 FINAL  Final  Urine Culture     Status: None   Collection Time: 01/21/19  5:31 PM  Result Value Ref Range Status   Specimen Description   Final    URINE, CLEAN CATCH Performed at [facility], [address] [place], [place] [address]    Special Requests   Final    NONE Performed at [facility], [address], [place], [place] [address]    Culture   Final    NO GROWTH Performed at [facility], [address], [place], [place] [address]    Report Status 01/23/2019 FINAL  Final  MRSA PCR Screening     Status: None   Collection Time: 01/22/19  1:00 AM  Result Value Ref Range Status   MRSA by PCR NEGATIVE NEGATIVE Final    Comment:        The GeneXpert MRSA Assay (FDA approved for NASAL specimens only), is one component of a comprehensive MRSA colonization surveillance program. It is not intended to diagnose MRSA infection nor to guide or monitor treatment for MRSA infections. Performed at [facility], [address] [place], [place] [address]   Culture, sputum-assessment     Status: None   Collection Time: 01/22/19  5:02 AM  Result Value Ref Range Status   Specimen Description SPUTUM  Final   Special Requests Normal  Final   Sputum evaluation   Final    THIS SPECIMEN IS ACCEPTABLE FOR SPUTUM CULTURE Performed at [facility], [address] [place], [place] [address]    Report Status 01/22/2019 FINAL  Final  Culture, respiratory     Status: None   Collection Time: 01/22/19  5:02 AM  Result Value Ref Range Status   Specimen Description SPUTUM  Final   Special Requests Normal Reflexed from [id number]  Final   Gram Stain   Final    MODERATE WBC PRESENT, PREDOMINANTLY PMN FEW GRAM NEGATIVE RODS RARE GRAM POSITIVE COCCI IN PAIRS    Culture   Final    Consistent with normal respiratory flora. Performed at [facility], [address], [place], [place] [address]    Report Status 01/24/2019 FINAL  Final  Gram stain     Status: None   Collection Time: 01/23/19  3:18 PM  Result Value Ref Range Status   Specimen Description FLUID PERITONEAL  Final   Special Requests NONE  Final   Gram Stain   Final    RARE WBC PRESENT,BOTH PMN AND MONONUCLEAR NO ORGANISMS SEEN Performed at [facility], [address] [place], [place] [address]    Report Status  01/23/2019 FINAL  Final  Culture, body fluid-bottle     Status: None   Collection Time: 01/23/19  3:18 PM  Result Value Ref Range Status   Specimen Description FLUID PERITONEAL  Final   Special Requests BOTTLES DRAWN AEROBIC AND ANAEROBIC 10CC  Final   Culture   Final    NO GROWTH 5 DAYS Performed at [facility], [address] [place], [place] [address]    Report Status 01/28/2019 FINAL  Final  Aerobic/Anaerobic Culture (surgical/deep wound)     Status: None (Preliminary result)   Collection Time: 01/25/19  3:57 PM  Result Value Ref Range Status   Specimen Description LUNG RIGHT PLEURAL  Final   Special Requests NONE  Final   Gram Stain   Final    ABUNDANT WBC PRESENT,BOTH PMN AND MONONUCLEAR ABUNDANT GRAM NEGATIVE RODS Performed at [facility], [address] [place], [place] [address]    Culture   Final    MODERATE ESCHERICHIA COLI NO ANAEROBES ISOLATED; CULTURE IN PROGRESS FOR 5 DAYS    Report Status PENDING  Incomplete   Organism ID, Bacteria ESCHERICHIA COLI  Final      Susceptibility   Escherichia coli - MIC*    AMPICILLIN 4 SENSITIVE Sensitive     CEFAZOLIN <=4 SENSITIVE Sensitive     CEFEPIME <=1 SENSITIVE Sensitive     CEFTAZIDIME <=1 SENSITIVE Sensitive     CEFTRIAXONE <=1 SENSITIVE Sensitive     CIPROFLOXACIN <=0.25 SENSITIVE Sensitive     GENTAMICIN <=1 SENSITIVE Sensitive     IMIPENEM <=0.25 SENSITIVE Sensitive     TRIMETH/SULFA >=320 RESISTANT Resistant     AMPICILLIN/SULBACTAM <=2 SENSITIVE Sensitive     PIP/TAZO <=4 SENSITIVE Sensitive     Extended ESBL NEGATIVE Sensitive     * MODERATE ESCHERICHIA COLI    Radiology Reports Ct Chest Wo Contrast  Result Date: 01/21/2019 CLINICAL DATA:  Right lower lobe opacity and complex pleural fluid seen on prior abdominal CT. EXAM: CT CHEST WITHOUT CONTRAST TECHNIQUE: Multidetector CT imaging of the chest was performed following the standard protocol without IV contrast. COMPARISON:  CT abdomen  01/21/2019 FINDINGS: Cardiovascular: Heart is normal size. Aorta is normal caliber. Coronary artery calcification in the left anterior descending coronary artery. Mediastinum/Nodes: No mediastinal, hilar, or axillary adenopathy. Lungs/Pleura: Dense consolidation noted in the right lower lobe compatible with pneumonia. Loculated pleural fluid noted along the right heart border and superior laterally in the right hemithorax. There is a rounded fluid collection posteriorly in the right lower hemithorax measuring 7.2 cm concerning for empyema or pulmonary abscess. On coronal imaging, this extends over a craniocaudal length of 10 cm. Dependent atelectasis in the left lower lobe. Upper Abdomen: Changes of cirrhosis with ascites. Musculoskeletal: No acute bony abnormality. Chest wall soft tissues are unremarkable. IMPRESSION: Dense consolidation in the right lower lobe compatible with pneumonia. Loculated right pleural fluid. Rounded fluid collection posteriorly in the right lower hemithorax which could be loculated fluid and reflect empyema or pulmonary abscess. Coronary artery disease. Cirrhosis, ascites. Electronically Signed   By: Charlett Nose M.D.   On: 01/21/2019 20:28   Ct Abdomen Pelvis W Contrast  Result Date: 01/21/2019 CLINICAL DATA:  Coughing since January. History  of thrombocytopenia, splenomegaly and cirrhosis. Recently diagnosed with pneumonia. EXAM: CT ABDOMEN AND PELVIS WITH CONTRAST TECHNIQUE: Multidetector CT imaging of the abdomen and pelvis was performed using the standard protocol following bolus administration of intravenous contrast. CONTRAST:  OMNIPAQUE IOHEXOL 300 MG/ML  SOLN COMPARISON:  Chest radiographs today. Abdominopelvic CT 05/08/2016. Right upper quadrant ultrasound 06/30/2018. FINDINGS: Lower chest: The lung bases are incompletely visualized. There is consolidation of the visualized right lower lobe with a large loculated right pleural effusion. This has a component posteriorly  with thick peripheral enhancement, measuring up to 7.9 x 5.2 cm on image 9/2. Based on the provided history, this is suspicious for empyema and possible pleural-based abscess. There is minimal atelectasis at the left lung base and no significant left pleural effusion. The azygos and hemi azygous veins are mildly dilated. Hepatobiliary: There are progressive changes of cirrhosis throughout the liver with diffuse contour irregularity and parenchymal heterogeneity. No dominant mass or focally abnormal enhancement identified. The portal vein is patent. No evidence of gallstones, gallbladder wall thickening or biliary dilatation. Pancreas: Unremarkable. No pancreatic ductal dilatation or surrounding inflammatory changes. Spleen: Measures approximately 14.7 x 7.5 x 12.3 cm (volume = 710 cm^3) consistent with mild splenomegaly. No focal abnormality. Adrenals/Urinary Tract: Both adrenal glands appear normal. The kidneys, ureters and bladder appear normal. Stomach/Bowel: No evidence of bowel wall thickening, distention or surrounding inflammatory change. There is a large amount of stool within the rectum. There is a probable loop of small bowel extending into an umbilical hernia. No evidence of bowel obstruction or incarceration. Vascular/Lymphatic: There are no enlarged abdominal or pelvic lymph nodes. Minimal aortic atherosclerosis. No acute vascular findings are identified. 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 The portal, superior mesenteric and splenic veins appear patent. Reproductive: The prostate gland and seminal vesicles appear normal. Other: Interval development of a moderate amount of ascites. Fluid extends into an umbilical hernia which measures up to 8.6 cm transverse on image 73/2. As above, there is air within this hernia as well which appears to be due to a small knuckle of small bowel. Musculoskeletal: No acute osseous findings. There are old rib fractures on the left.  There are changes of bilateral femoral head avascular necrosis with early subchondral collapse on the right. There is edema throughout the subcutaneous fat. In the upper right buttocks, there is a globular area of increased signal which could be related to prior trauma/injection or reflect fat necrosis. IMPRESSION: 1. Right lower lobe pulmonary consolidation with complex fluid in the right pleural space, worrisome for empyema and pleural-based abscess. Full chest CT may be helpful for further evaluation. 2. Significantly progressive changes of diffuse hepatic cirrhosis compared with previous CT from 2017. Mild changes of portal hypertension with splenomegaly and venous collaterals. The main portal vein appears patent. 3. Progressive moderate volume ascites. Ascites and a small bowel loop extend into an umbilical hernia. No evidence of bowel obstruction or incarceration. 4. Chronic bilateral femoral head avascular necrosis. Electronically Signed   By: Carey Bullocks M.D.   On: 01/21/2019 19:37   US Renal  Result Date: 01/23/2019 CLINICAL DATA:  Renal failure EXAM: RENAL / URINARY TRACT ULTRASOUND COMPLETE COMPARISON:  None. FINDINGS: Right Kidney: Renal measurements: 12.4 x 6.8 x 6.4 cm = volume: 286 mL . Echogenicity within normal limits. No mass or hydronephrosis visualized. Left Kidney: Renal measurements: 11.8 x 6 x 6.5 cm = volume: 239 mL. Echogenicity within normal limits. No mass or hydronephrosis visualized. Bladder: Appears  normal for degree of bladder distention. Other: Increased hepatic echogenicity with a slightly nodular contour as can be seen with hepatic cirrhosis. Small amount of perihepatic ascites. Splenomegaly with the spleen measuring 18.5 cm in length and a volume of 1535 mL. IMPRESSION: 1. Normal renal ultrasound. 2. Increased hepatic echogenicity with a slightly nodular contour as can be seen with hepatic cirrhosis. Small volume perihepatic ascites. Splenomegaly. Correlate with liver  function test. Electronically Signed   By: Elige Ko   On: 01/23/2019 10:06   Dg Chest Port 1 View  Result Date: 01/29/2019 CLINICAL DATA:  Right chest tube removal EXAM: PORTABLE CHEST 1 VIEW COMPARISON:  Chest radiograph from one day prior. FINDINGS: Low lung volumes. Interval removal of basilar right chest tube. Stable cardiomediastinal silhouette with top-normal heart size. No pneumothorax. No left pleural effusion. Stable blunting of the right costophrenic angle. No overt pulmonary edema. Patchy right lung base opacity is unchanged. IMPRESSION: 1. No pneumothorax status post right chest tube removal. 2. Low lung volumes with stable patchy right lung base opacity and blunting of the right costophrenic angle, which could represent residual small pleural effusions/pleural thickening. Continued chest radiograph follow-up buys. Electronically Signed   By: Delbert Phenix M.D.   On: 01/29/2019 14:11   Dg Chest Port 1 View  Result Date: 01/28/2019 CLINICAL DATA:  Empyema of pleural space EXAM: PORTABLE CHEST 1 VIEW COMPARISON:  Yesterday FINDINGS: Pigtail catheter over the right base is unchanged. Unchanged pleural fluid and parenchymal opacity. Comparatively clear left chest. Normal heart size. IMPRESSION: Unchanged pleuroparenchymal opacity at the right base. Electronically Signed   By: Marnee Spring M.D.   On: 01/28/2019 09:24   Dg Chest Port 1 View  Result Date: 01/27/2019 CLINICAL DATA:  Empyema. EXAM: PORTABLE CHEST 1 VIEW COMPARISON:  Radiograph of January 26, 2019. FINDINGS: The heart size and mediastinal contours are within normal limits. Left lung is clear. Stable position of right-sided pleural drainage catheter is noted. Right pleural effusion is unchanged. There is associated atelectasis in right lung base. No pneumothorax is noted. The visualized skeletal structures are unremarkable. IMPRESSION: Stable position of right-sided pleural drainage catheter with stable right pleural effusion and  associated atelectasis. Electronically Signed   By: Lupita Raider M.D.   On: 01/27/2019 07:13   Dg Chest Port 1 View  Result Date: 01/26/2019 CLINICAL DATA:  Chest tube EXAM: PORTABLE CHEST 1 VIEW COMPARISON:  01/21/2019 FINDINGS: Right basilar chest tube is now in place. Right pleural effusion and basilar pulmonary opacity improved. Increasing small left pleural effusion. Upper lungs clear. Cardiomegaly. No pneumothorax. IMPRESSION: Improved right pleural effusion after chest tube placement. New small left pleural effusion. Electronically Signed   By: Jolaine Click M.D.   On: 01/26/2019 08:00   Dg Chest Portable 1 View  Result Date: 01/21/2019 CLINICAL DATA:  38 year old with current history of hepatic cirrhosis and, by the patient's report, recent diagnosis of pneumonia at an outside facility. EXAM: PORTABLE CHEST 1 VIEW COMPARISON:  02/21/2018 and earlier, including CTA chest 05/13/2015. FINDINGS: Cardiac silhouette moderately to markedly enlarged, unchanged since 2019 but increased in size since 2016. Pulmonary venous hypertension with perhaps minimal interstitial pulmonary edema. Large RIGHT pleural effusion and associated consolidation in the RIGHT LOWER LOBE. Lungs otherwise clear. No visible LEFT pleural effusion. IMPRESSION: 1. Large RIGHT pleural effusion and associated passive atelectasis and/or pneumonia involving the RIGHT LOWER LOBE. 2. Stable moderate to marked cardiomegaly. Pulmonary venous hypertension with perhaps minimal interstitial pulmonary edema indicating CHF and/or fluid  overload. Electronically Signed   By: Hulan Saas M.D.   On: 01/21/2019 16:25   Ct Image Guided Fluid Drain By Catheter  Result Date: 01/25/2019 INDICATION: 38 year old with right lung pneumonia, loculated right pleural fluid and a focal collection in the posterior lower chest. Findings are concerning for an empyema or lung abscess. Plan for percutaneous drainage of this posterior right chest fluid  collection. EXAM: CT-GUIDED PLACEMENT OF RIGHT CHEST TUBE MEDICATIONS: No antibiotics were given for this procedure. ANESTHESIA/SEDATION: Fentanyl 50 mcg IV; Versed 1.5 mg IV Moderate Sedation Time:  41 minutes The patient was continuously monitored during the procedure by the interventional radiology nurse under my direct supervision. COMPLICATIONS: None immediate. PROCEDURE: Informed written consent was obtained from the patient after a thorough discussion of the procedural risks, benefits and alternatives. All questions were addressed. Maximal Sterile Barrier Technique was utilized including caps, mask, sterile gowns, sterile gloves, sterile drape, hand hygiene and skin antiseptic. A timeout was performed prior to the initiation of the procedure. Patient was placed prone. CT images through the lower chest were obtained. The right side of the chest was prepped and draped in sterile fashion. Skin and soft tissues anesthetized with 1% lidocaine. Using CT guidance, a Yueh catheter was directed into the right posterior lower chest fluid collection. Initially, thick yellow fluid was aspirated. Stiff Amplatz wire was advanced into the collection and the patient started to cough and became symptomatic. The wire was removed for the Yueh catheter again. Additional bloody fluid was removed from the chest collection. Eventually, the patient's coughing stopped. Subsequently, Yueh catheter was removed over a stiff Amplatz wire and the percutaneous tract was dilated to accommodate a 12 [place] drain. Approximately 100 mL of thick bloody purulent fluid was removed from the chest collection. The tube was starting to clot and the tube was irrigated with normal saline. The tube was attached to a PleurEvac and wall suction. Additional bloody fluid was removed. Catheter was sutured to skin and a bandage was placed. FINDINGS: Again noted is a complex fluid collection in the posteromedial right lower chest adjacent to right lung  consolidation. Catheter was directed into the fluid collection and thick bloody purulent fluid was removed. 12 French drain was placed. IMPRESSION: CT-guided placement of a 12 French drain within the collection in the posterior right lower chest. Findings are concerning for a lung abscess or empyema. Electronically Signed   By: Richarda Overlie M.D.   On: 01/25/2019 17:42   Ir Paracentesis  Result Date: 01/23/2019 INDICATION: Patient with history of alcoholic cirrhosis with recurrent ascites. Request is made for diagnostic and therapeutic paracentesis. EXAM: ULTRASOUND GUIDED DIAGNOSTIC AND THERAPEUTIC PARACENTESIS MEDICATIONS: 10 mL 1% lidocaine COMPLICATIONS: None immediate. PROCEDURE: Informed written consent was obtained from the patient after a discussion of the risks, benefits and alternatives to treatment. A timeout was performed prior to the initiation of the procedure. Initial ultrasound scanning demonstrates a moderate amount of ascites within the right lower abdominal quadrant. The right lower abdomen was prepped and draped in the usual sterile fashion. 1% lidocaine was used for local anesthesia. Following this, a 19 gauge, 7-cm, Yueh catheter was introduced. An ultrasound image was saved for documentation purposes. The paracentesis was performed. The catheter was removed and a dressing was applied. The patient tolerated the procedure well without immediate post procedural complication. FINDINGS: A total of approximately 2.2 L of clear yellow fluid was removed. Samples were sent to the laboratory as requested by the clinical team. IMPRESSION: Successful ultrasound-guided paracentesis  yielding 2.2 L of peritoneal fluid. Read by: Elwin Mocha, PA-C Electronically Signed   By: Richarda Overlie M.D.   On: 01/23/2019 15:46    CBC Recent Labs  Lab 01/23/19 0627 01/24/19 0447 01/25/19 0556 01/26/19 0926 01/27/19 0351 01/29/19 0048  WBC 29.9* 16.1* 14.0* 13.2* 10.7* 11.7*  HGB 10.1* 8.4* 7.5* 8.8* 8.2* 7.7*   HCT 28.6* 24.1* 21.9* 25.7* 24.5* 23.1*  PLT 125* 99* 85* 91* 86* 111*  MCV 97.9 97.6 99.5 100.8* 101.2* 102.7*  MCH 34.6* 34.0 34.1* 34.5* 33.9 34.2*  MCHC 35.3 34.9 34.2 34.2 33.5 33.3  RDW 14.7 14.9 15.6* 16.0* 16.7* 17.7*  LYMPHSABS 1.3 0.5*  --   --   --   --   MONOABS 1.6* 0.7  --   --   --   --   EOSABS 0.1 0.1  --   --   --   --   BASOSABS 0.1 0.1  --   --   --   --    Chemistries  Recent Labs  Lab 01/23/19 0627 01/24/19 0447 01/25/19 0556 01/26/19 0926 01/27/19 0351 01/29/19 0048  NA 124* 124* 129* 130* 131* 132*  K 4.2 4.9 4.4 4.4 4.5 4.7  CL 93* 96* 101 101 102 103  CO2 21* 17* 17* 18* 19* 19*  GLUCOSE 110* 87 103* 109* 98 117*  BUN 9 16 21* 20 18 22*  CREATININE 1.76* 1.47* 1.37* 1.21 1.08 1.29*  CALCIUM 8.0* 8.6* 9.3 9.8 9.6 9.2  MG 1.1* 2.0  --   --   --   --   AST 124* 80* 56* 61*  --  70*  ALT 40 30 24 26   --  26  ALKPHOS 162* 105 94 106  --  97  BILITOT 4.7* 6.7* 6.9* 8.5*  --  6.1*   ------------------------------------------------------------------------------------------------------------------ No results for input(s): CHOL, HDL, LDLCALC, TRIG, CHOLHDL, LDLDIRECT in the last 72 hours.  No results found for: HGBA1C ------------------------------------------------------------------------------------------------------------------ No results for input(s): TSH, T4TOTAL, T3FREE, THYROIDAB in the last 72 hours.  Invalid input(s): FREET3 ------------------------------------------------------------------------------------------------------------------ No results for input(s): VITAMINB12, FOLATE, FERRITIN, TIBC, IRON, RETICCTPCT in the last 72 hours.  Coagulation profile Recent Labs  Lab 01/25/19 0556 01/26/19 0424 01/27/19 0351 01/28/19 0454 01/29/19 0048  INR 1.9* 1.6* 1.6* 1.4* 1.5*   No results for input(s): DDIMER in the last 72 hours.  Cardiac Enzymes No results for input(s): CKMB, TROPONINI, MYOGLOBIN in the last 168 hours.  Invalid  input(s): CK ------------------------------------------------------------------------------------------------------------------    Component Value Date/Time   BNP 318.0 (H) 01/21/2019 1648   Shon Hale M.D on 01/29/2019 at 2:34 PM  Go to [email or web address] - for contact info  [facility] - Office  [phone]

## 2019-01-30 ENCOUNTER — Inpatient Hospital Stay (HOSPITAL_COMMUNITY): Payer: Medicaid - Out of State

## 2019-01-30 LAB — AEROBIC/ANAEROBIC CULTURE W GRAM STAIN (SURGICAL/DEEP WOUND)

## 2019-01-30 LAB — BASIC METABOLIC PANEL
Anion gap: 10 (ref 5–15)
BUN: 30 mg/dL — ABNORMAL HIGH (ref 6–20)
CO2: 15 mmol/L — ABNORMAL LOW (ref 22–32)
Calcium: 8.9 mg/dL (ref 8.9–10.3)
Chloride: 107 mmol/L (ref 98–111)
Creatinine, Ser: 1.85 mg/dL — ABNORMAL HIGH (ref 0.61–1.24)
GFR calc Af Amer: 53 mL/min — ABNORMAL LOW (ref >60)
GFR calc non Af Amer: 45 mL/min — ABNORMAL LOW (ref >60)
Glucose, Bld: 98 mg/dL (ref 70–99)
Potassium: 5.4 mmol/L — ABNORMAL HIGH (ref 3.5–5.1)
Sodium: 132 mmol/L — ABNORMAL LOW (ref 135–145)

## 2019-01-30 LAB — CBC
HCT: 22.2 % — ABNORMAL LOW (ref 39.0–52.0)
Hemoglobin: 7.4 g/dL — ABNORMAL LOW (ref 13.0–17.0)
MCH: 34.9 pg — ABNORMAL HIGH (ref 26.0–34.0)
MCHC: 33.3 g/dL (ref 30.0–36.0)
MCV: 104.7 fL — ABNORMAL HIGH (ref 80.0–100.0)
Platelets: 130 10*3/uL — ABNORMAL LOW (ref 150–400)
RBC: 2.12 MIL/uL — ABNORMAL LOW (ref 4.22–5.81)
RDW: 18.6 % — ABNORMAL HIGH (ref 11.5–15.5)
WBC: 17.5 10*3/uL — ABNORMAL HIGH (ref 4.0–10.5)
nRBC: 0 % (ref 0.0–0.2)

## 2019-01-30 NOTE — Progress Notes (Addendum)
      [address]       [name] [address]             [phone]           Subjective: Patient with pain in back (at chest tube site). He states his breathing is fine.  Objective: Vital signs in last 24 hours: Temp:  [99 F (37.2 C)-99.1 F (37.3 C)] 99 F (37.2 C) (05/03 2344) Pulse Rate:  [93-113] 111 (05/03 2353) Cardiac Rhythm: Normal sinus rhythm (05/04 0707) BP: (92-111)/(40-58) 102/53 (05/03 2353) SpO2:  [92 %-96 %] 92 % (05/03 2344) Weight:  [92.9 kg] 92.9 kg (05/04 0500)     Intake/Output from previous day: 05/03 0701 - 05/04 0700 In: 100 [IV Piggyback:100] Out: 1090 [Urine:800; Chest Tube:290]   Physical Exam:  Pulmonary: Clear to auscultation on left and diminished on right base Wound: Dressing with small amount of dried blood.  Lab Results: CBC: Recent Labs    01/29/19 0048 01/30/19 0553  WBC 11.7* 17.5*  HGB 7.7* 7.4*  HCT 23.1* 22.2*  PLT 111* 130*   BMET:  Recent Labs    01/29/19 0048 01/30/19 0553  NA 132* 132*  K 4.7 5.4*  CL 103 107  CO2 19* 15*  GLUCOSE 117* 98  BUN 22* 30*  CREATININE 1.29* 1.85*  CALCIUM 9.2 8.9    PT/INR:  Recent Labs    01/29/19 0048  LABPROT 17.5*  INR 1.5*   ABG:  INR: Will add last result for INR, ABG once components are confirmed Will add last 4 CBG results once components are confirmed  Assessment/Plan: 1.  Pulmonary - Chest tube removed yesterday. CXR this am appears stable. On 2-3 liters of oxygen via [place]. 2. Management per primary  Donielle M ZimmermanPA-C 01/30/2019,7:56 AM [phone]  Patient seen and examined, agree with above Remains afebrile, but WBC was back up to 17.5K yesterday Drain is out. His CXR does look a little better than it did on admission, but still has residual consolidation +/- loculated effusion at right base Will need prolonged course of antibiotics Would have low threshold to rescan if WBC continues to rise or has recurrent fevers  Viviann Spare  C. Dorris Fetch, MD [facility] [phone]

## 2019-01-30 NOTE — Progress Notes (Signed)
   IR Round Note via phone  RN states pt is stable Complains of pain at chest tube site But it looks fine; no hematoma; no ecchymosis  CXR today:  IMPRESSION: 1. Persistent moderate loculated right pleural effusion and right lower lobe pneumonia.  Followed by Dr Dorris Fetch  Call IR if any needs

## 2019-01-30 NOTE — Progress Notes (Signed)
Patient Demographics:    Grant [name], is a 38 y.o. male, DOB - 1981-03-30, ZOX:[id number]  Admit date - 01/21/2019   Admitting Physician Glade Lloyd, MD  Outpatient Primary MD for the patient is Patient, No Pcp Per  LOS - 9  Chief Complaint  Patient presents with   Shortness of Breath        Subjective:    Grant [name] today has no fevers, no emesis  , hypoxia persist, continues to have dyspnea on exertion.....   Assessment  & Plan :    Principal Problem:   Acute respiratory failure with hypoxia (HCC) Active Problems:   Alcoholic cirrhosis of liver with ascites (HCC)   Esophageal reflux   Morbid obesity due to excess calories (HCC)   Hypokalemia   Ascites   RLL pneumonia (HCC)   Empyema of right pleural space (HCC)   Hyponatremia   Supratherapeutic INR   Lactic acidosis  Brief Summary 38 year old male with history of alcoholic cirrhosis, portal vein thrombosis diagnosed in 06/2018 for which he is currently on Xarelto, severe microcytic anemia, GERD, splenomegaly presented with shortness of breath and cough worsening over the past 3 weeks, with COVID-19 testing done as an outpatient 3 weeks PTA apparently negative, repeat COVID test on 01/21/2019 at our facility was also negative. Admitted on 01/21/2027 right lower lobe pneumonia with loculated pleural fluid, likely empyema Versus Abscess.  He was also found to have elevated INR along with hyponatremia.  CT surgery was consulted and recommends percutaneous drainage by interventional radiology, s/p Rt pleural fluid drain/chest tube placement on 01/25/2019, chest tube removed 01/29/2019  A/p 1)Rt LL E  Coli pneumonia with loculated pleural fluid/Empyema Versus Abscess.---- s/p  percutaneous drainage by IR on 01/25/2019, vancomycin discontinued on 01/23/2019, pleural fluid culture from 01/25/2019 growing pansensitive E. coli, switched from IV cefepime to  IV Rocephin starting 01/27/2019 , strep pneumo antigen negative ,  WBC trending up , hypoxia persist, continues to have dyspnea on exertion--- may need repeat CT chest if respiratory symptoms and WBC continues to trend up  Interventional radiology and CT surgery helping to manage chest tube- S/p (R)pigtail chest tube on 01/25/19,   chest tube was removed on 01/29/19,......  Follow-up CXR on 01/29/2019 and 01/30/2019 with residual right-sided infected fluid ,no pneumothorax   2)SBP--- paracentesis fluid from 01/23/2019 with 258 ANC consistent with SBP,switched from IV cefepime to IV Rocephin starting 01/27/2019  , paracentesis fluid cx NGTD  3)Supratherapeutic INR--- liver disease with some degree of auto anticoagulation--- PTA was on Xarelto for portal vein thrombosis, patient received vitamin K, INR 1.5  4)Alcoholic liver cirrhosis with ascites--- status post paracentesis on 01/23/2019--- denies recent alcohol use,,  Continue folic acid and MVI.  C/n  lactulose 10 g daily..... No evidence of encephalopathy at this time, serum ammonia is 57, hold Aldactone and Lasix due to kidney concerns  5)FEN/hypokalemia/hypomagnesemia//hyponatremia--- continue to replace potassium and magnesium,    6)AKI--- possibly contrast induced ATN, nephrology consult appreciated,, ultrasound without significant abnormalities...., Creatinine baseline was 0.6, creatinine is back up to 1.85 from 1.29 suspect due to IV diuresis volume depletion.. Continue to avoid nephrotoxic agents , stop Aldactone, stop IV Lasix  7)Chronic Thrombocytopenia/chronic Anemia-----most likely due to underlying liver disease/cirrhosis, no evidence of  ongoing bleeding,  Hgb is down to 7.4,   continue to monitor--no indication for transfusion at this time, platelet count is up to 130 k  Disposition/Need for in-Hospital Stay- patient unable to be discharged at this time due to right lung empyema/abscess requiring percutaneous drainage/tube placement and IV  antibiotics , as per cardiothoracic surgeon -WBC trending up , hypoxia persist, continues to have dyspnea on exertion--- needs IV antibiotics, renal function is worse, may need IV fluids  Code Status : full  Family Communication:   na  Disposition Plan  : Possibly home when medically improved  Procedure: 01/25/19  CT guided chest drain placement  chest tube was removed on 01/29/19, Findings: Thick wall collection in right posterior lower chest.  Thick bloody purulent fluid aspirated, approximately 100 ml.  12 Fr drain placed.  Thick bloody fluid draining at end of procedure.   Rt CT Removed 01/29/19  Consults  : CT surgery/IR  DVT Prophylaxis  :  - SCDs/Xarelto on hold  Lab Results  Component Value Date   PLT 130 (L) 01/30/2019   Inpatient Medications  Scheduled Meds:  folic acid  1 mg Oral Daily   lactulose  10 g Oral Daily   magnesium oxide  400 mg Oral Daily   mouth rinse  15 mL Mouth Rinse BID   multivitamin with minerals  1 tablet Oral Daily   pantoprazole  40 mg Oral Daily   potassium chloride  40 mEq Oral BID   propranolol  10 mg Oral TID   thiamine  100 mg Oral Daily   Continuous Infusions:  cefTRIAXone (ROCEPHIN)  IV 2 g (01/30/19 1552)   PRN Meds:.benzonatate, guaiFENesin-dextromethorphan    Anti-infectives (From admission, onward)   Start     Dose/Rate Route Frequency Ordered Stop   01/27/19 1600  cefTRIAXone (ROCEPHIN) 2 g in sodium chloride 0.9 % 100 mL IVPB     2 g 200 mL/hr over 30 Minutes Intravenous Every 24 hours 01/27/19 1345     01/22/19 1000  ceFEPIme (MAXIPIME) 2 g in sodium chloride 0.9 % 100 mL IVPB  Status:  Discontinued     2 g 200 mL/hr over 30 Minutes Intravenous Every 8 hours 01/22/19 0904 01/27/19 1345   01/22/19 0400  vancomycin (VANCOCIN) IVPB 1000 mg/200 mL premix  Status:  Discontinued     1,000 mg 200 mL/hr over 60 Minutes Intravenous Every 12 hours 01/21/19 1820 01/23/19 0855   01/21/19 1800  vancomycin (VANCOCIN) IVPB  1000 mg/200 mL premix     1,000 mg 200 mL/hr over 60 Minutes Intravenous Every 1 hr x 2 01/21/19 1703 01/21/19 2042   01/21/19 1645  ceFEPIme (MAXIPIME) 1 g in sodium chloride 0.9 % 100 mL IVPB     1 g 200 mL/hr over 30 Minutes Intravenous  Once 01/21/19 1633 01/21/19 1802        Objective:   Vitals:   01/29/19 2353 01/30/19 0500 01/30/19 0835 01/30/19 1538  BP: (!) 102/53  (!) 108/54 (!) 91/41  Pulse: (!) 111  86 88  Resp:   17 16  Temp:   98.5 F (36.9 C) 98.5 F (36.9 C)  TempSrc:   Oral Oral  SpO2:   96% 91%  Weight:  92.9 kg    Height:        Wt Readings from Last 3 Encounters:  01/30/19 92.9 kg  11/04/18 100.5 kg  10/11/18 100.7 kg     Intake/Output Summary (Last 24 hours) at 01/30/2019 1622 Last  data filed at 01/29/2019 1814 Gross per 24 hour  Intake --  Output 400 ml  Net -400 ml   Physical Exam Patient is examined daily including today on 01/30/19 , exams remain the same as of yesterday except that has changed   Gen:- Awake Alert, in no acute distress HEENT:- [place].AT, No sclera icterus Nose- 2 L/min Neck-Supple Neck,No JVD,.  Lungs-diminished in right lung base , no wheezing CV- S1, S2 normal, regular  Abd-umbilical hernia, +ve Ascites, +ve abdominal distention, no significant tenderness Extremity/Skin:- 2 +ve edema, pedal pulses present  Psych-affect is appropriate, oriented x3 Neuro-no new focal deficits,     Data Review:   Micro Results Recent Results (from the past 240 hour(s))  SARS Coronavirus 2 [facility] order, Performed in [facility] hospital lab)     Status: None   Collection Time: 01/21/19  3:58 PM  Result Value Ref Range Status   SARS Coronavirus 2 NEGATIVE NEGATIVE Final    Comment: (NOTE) If result is NEGATIVE SARS-CoV-2 target nucleic acids are NOT DETECTED. The SARS-CoV-2 RNA is generally detectable in upper and lower  respiratory specimens during the acute phase of infection. The lowest  concentration of SARS-CoV-2 viral copies this  assay can detect is 250  copies / mL. A negative result does not preclude SARS-CoV-2 infection  and should not be used as the sole basis for treatment or other  patient management decisions.  A negative result may occur with  improper specimen collection / handling, submission of specimen other  than nasopharyngeal swab, presence of viral mutation(s) within the  areas targeted by this assay, and inadequate number of viral copies  (<250 copies / mL). A negative result must be combined with clinical  observations, patient history, and epidemiological information. If result is POSITIVE SARS-CoV-2 target nucleic acids are DETECTED. The SARS-CoV-2 RNA is generally detectable in upper and lower  respiratory specimens dur ing the acute phase of infection.  Positive  results are indicative of active infection with SARS-CoV-2.  Clinical  correlation with patient history and other diagnostic information is  necessary to determine patient infection status.  Positive results do  not rule out bacterial infection or co-infection with other viruses. If result is PRESUMPTIVE POSTIVE SARS-CoV-2 nucleic acids MAY BE PRESENT.   A presumptive positive result was obtained on the submitted specimen  and confirmed on repeat testing.  While 2019 novel coronavirus  (SARS-CoV-2) nucleic acids may be present in the submitted sample  additional confirmatory testing may be necessary for epidemiological  and / or clinical management purposes  to differentiate between  SARS-CoV-2 and other Sarbecovirus currently known to infect humans.  If clinically indicated additional testing with an alternate test  methodology [phone]) is advised. The SARS-CoV-2 RNA is generally  detectable in upper and lower respiratory sp ecimens during the acute  phase of infection. The expected result is Negative. Fact Sheet for Patients:  BoilerBrush.com.cy Fact Sheet for Healthcare  Providers: https://pope.com/ This test is not yet approved or cleared by the [place] FDA and has been authorized for detection and/or diagnosis of SARS-CoV-2 by FDA under an Emergency Use Authorization (EUA).  This EUA will remain in effect (meaning this test can be used) for the duration of the COVID-19 declaration under Section 564(b)(1) of the Act, 21 U.S.C. section 360bbb-3(b)(1), unless the authorization is terminated or revoked sooner. Performed at [facility], [address], [place], [place] [address]   Culture, blood (Routine X 2) w Reflex to ID Panel  Status: None   Collection Time: 01/21/19  4:53 PM  Result Value Ref Range Status   Specimen Description   Final    LEFT ANTECUBITAL BOTTLES DRAWN AEROBIC AND ANAEROBIC   Special Requests Blood Culture adequate volume  Final   Culture   Final    NO GROWTH 5 DAYS Performed at [facility], [address], [place], [place] [address]    Report Status 01/26/2019 FINAL  Final  Culture, blood (Routine X 2) w Reflex to ID Panel     Status: None   Collection Time: 01/21/19  4:54 PM  Result Value Ref Range Status   Specimen Description BLOOD LEFT ARM BOTTLES DRAWN AEROBIC ONLY  Final   Special Requests Blood Culture adequate volume  Final   Culture   Final    NO GROWTH 5 DAYS Performed at [facility], [address] [place], [place] [address]    Report Status 01/26/2019 FINAL  Final  Urine Culture     Status: None   Collection Time: 01/21/19  5:31 PM  Result Value Ref Range Status   Specimen Description   Final    URINE, CLEAN CATCH Performed at [facility], [address] [place], [place] [address]    Special Requests   Final    NONE Performed at [facility], [address], [place], [place] [address]    Culture   Final    NO GROWTH Performed at [facility], [address] [place], [place] [address]    Report Status 01/23/2019 FINAL  Final  MRSA PCR Screening     Status: None    Collection Time: 01/22/19  1:00 AM  Result Value Ref Range Status   MRSA by PCR NEGATIVE NEGATIVE Final    Comment:        The GeneXpert MRSA Assay (FDA approved for NASAL specimens only), is one component of a comprehensive MRSA colonization surveillance program. It is not intended to diagnose MRSA infection nor to guide or monitor treatment for MRSA infections. Performed at [facility], [address], [place], [place] [address]   Culture, sputum-assessment     Status: None   Collection Time: 01/22/19  5:02 AM  Result Value Ref Range Status   Specimen Description SPUTUM  Final   Special Requests Normal  Final   Sputum evaluation   Final    THIS SPECIMEN IS ACCEPTABLE FOR SPUTUM CULTURE Performed at [facility], [address], [place], [place] [address]    Report Status 01/22/2019 FINAL  Final  Culture, respiratory     Status: None   Collection Time: 01/22/19  5:02 AM  Result Value Ref Range Status   Specimen Description SPUTUM  Final   Special Requests Normal Reflexed from [id number]  Final   Gram Stain   Final    MODERATE WBC PRESENT, PREDOMINANTLY PMN FEW GRAM NEGATIVE RODS RARE GRAM POSITIVE COCCI IN PAIRS    Culture   Final    Consistent with normal respiratory flora. Performed at [facility], [address] [place], [place] [address]    Report Status 01/24/2019 FINAL  Final  Gram stain     Status: None   Collection Time: 01/23/19  3:18 PM  Result Value Ref Range Status   Specimen Description FLUID PERITONEAL  Final   Special Requests NONE  Final   Gram Stain   Final    RARE WBC PRESENT,BOTH PMN AND MONONUCLEAR NO ORGANISMS SEEN Performed at [facility], [address] [place], [place] [address]    Report Status 01/23/2019 FINAL  Final  Culture, body fluid-bottle     Status: None   Collection Time: 01/23/19  3:18 PM  Result Value Ref Range Status   Specimen Description FLUID PERITONEAL  Final   Special Requests BOTTLES DRAWN  AEROBIC AND ANAEROBIC 10CC  Final   Culture   Final    NO GROWTH 5 DAYS Performed at [facility], [address] [place], [place] [address]    Report Status 01/28/2019 FINAL  Final  Aerobic/Anaerobic Culture (surgical/deep wound)     Status: None   Collection Time: 01/25/19  3:57 PM  Result Value Ref Range Status   Specimen Description LUNG RIGHT PLEURAL  Final   Special Requests NONE  Final   Gram Stain   Final    ABUNDANT WBC PRESENT,BOTH PMN AND MONONUCLEAR ABUNDANT GRAM NEGATIVE RODS    Culture   Final    MODERATE ESCHERICHIA COLI NO ANAEROBES ISOLATED Performed at [facility], [address] [place], [place] [address]    Report Status 01/30/2019 FINAL  Final   Organism ID, Bacteria ESCHERICHIA COLI  Final      Susceptibility   Escherichia coli - MIC*    AMPICILLIN 4 SENSITIVE Sensitive     CEFAZOLIN <=4 SENSITIVE Sensitive     CEFEPIME <=1 SENSITIVE Sensitive     CEFTAZIDIME <=1 SENSITIVE Sensitive     CEFTRIAXONE <=1 SENSITIVE Sensitive     CIPROFLOXACIN <=0.25 SENSITIVE Sensitive     GENTAMICIN <=1 SENSITIVE Sensitive     IMIPENEM <=0.25 SENSITIVE Sensitive     TRIMETH/SULFA >=320 RESISTANT Resistant     AMPICILLIN/SULBACTAM <=2 SENSITIVE Sensitive     PIP/TAZO <=4 SENSITIVE Sensitive     Extended ESBL NEGATIVE Sensitive     * MODERATE ESCHERICHIA COLI    Radiology Reports Dg Chest 2 View  Result Date: 01/30/2019 CLINICAL DATA:  Chest tube removal. History of right lower lobe pneumonia and empyema. EXAM: CHEST - 2 VIEW COMPARISON:  Chest x-ray from yesterday. FINDINGS: Stable cardiomediastinal silhouette. Normal pulmonary vascularity. Persistent moderate loculated right pleural effusion and right lower lobe consolidation. Minimal atelectasis at the left lung base. No pneumothorax. No acute osseous abnormality. IMPRESSION: 1. Persistent moderate loculated right pleural effusion and right lower lobe pneumonia. Electronically Signed   By: Obie DredgeWilliam T Derry  M.D.   On: 01/30/2019 08:15   Ct Chest Wo Contrast  Result Date: 01/21/2019 CLINICAL DATA:  Right lower lobe opacity and complex pleural fluid seen on prior abdominal CT. EXAM: CT CHEST WITHOUT CONTRAST TECHNIQUE: Multidetector CT imaging of the chest was performed following the standard protocol without IV contrast. COMPARISON:  CT abdomen 01/21/2019 FINDINGS: Cardiovascular: Heart is normal size. Aorta is normal caliber. Coronary artery calcification in the left anterior descending coronary artery. Mediastinum/Nodes: No mediastinal, hilar, or axillary adenopathy. Lungs/Pleura: Dense consolidation noted in the right lower lobe compatible with pneumonia. Loculated pleural fluid noted along the right heart border and superior laterally in the right hemithorax. There is a rounded fluid collection posteriorly in the right lower hemithorax measuring 7.2 cm concerning for empyema or pulmonary abscess. On coronal imaging, this extends over a craniocaudal length of 10 cm. Dependent atelectasis in the left lower lobe. Upper Abdomen: Changes of cirrhosis with ascites. Musculoskeletal: No acute bony abnormality. Chest wall soft tissues are unremarkable. IMPRESSION: Dense consolidation in the right lower lobe compatible with pneumonia. Loculated right pleural fluid. Rounded  fluid collection posteriorly in the right lower hemithorax which could be loculated fluid and reflect empyema or pulmonary abscess. Coronary artery disease. Cirrhosis, ascites. Electronically Signed   By: Charlett Nose M.D.   On: 01/21/2019 20:28   Ct Abdomen Pelvis W Contrast  Result Date: 01/21/2019 CLINICAL DATA:  Coughing since January. History of thrombocytopenia, splenomegaly and cirrhosis. Recently diagnosed with pneumonia. EXAM: CT ABDOMEN AND PELVIS WITH CONTRAST TECHNIQUE: Multidetector CT imaging of the abdomen and pelvis was performed using the standard protocol following bolus administration of intravenous contrast. CONTRAST:   OMNIPAQUE IOHEXOL 300 MG/ML  SOLN COMPARISON:  Chest radiographs today. Abdominopelvic CT 05/08/2016. Right upper quadrant ultrasound 06/30/2018. FINDINGS: Lower chest: The lung bases are incompletely visualized. There is consolidation of the visualized right lower lobe with a large loculated right pleural effusion. This has a component posteriorly with thick peripheral enhancement, measuring up to 7.9 x 5.2 cm on image 9/2. Based on the provided history, this is suspicious for empyema and possible pleural-based abscess. There is minimal atelectasis at the left lung base and no significant left pleural effusion. The azygos and hemi azygous veins are mildly dilated. Hepatobiliary: There are progressive changes of cirrhosis throughout the liver with diffuse contour irregularity and parenchymal heterogeneity. No dominant mass or focally abnormal enhancement identified. The portal vein is patent. No evidence of gallstones, gallbladder wall thickening or biliary dilatation. Pancreas: Unremarkable. No pancreatic ductal dilatation or surrounding inflammatory changes. Spleen: Measures approximately 14.7 x 7.5 x 12.3 cm (volume = 710 cm^3) consistent with mild splenomegaly. No focal abnormality. Adrenals/Urinary Tract: Both adrenal glands appear normal. The kidneys, ureters and bladder appear normal. Stomach/Bowel: No evidence of bowel wall thickening, distention or surrounding inflammatory change. There is a large amount of stool within the rectum. There is a probable loop of small bowel extending into an umbilical hernia. No evidence of bowel obstruction or incarceration. Vascular/Lymphatic: There are no enlarged abdominal or pelvic lymph nodes. Minimal aortic atherosclerosis. No acute vascular findings are identified. 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 The portal, superior mesenteric and splenic veins appear patent. Reproductive: The prostate gland and seminal vesicles appear normal.  Other: Interval development of a moderate amount of ascites. Fluid extends into an umbilical hernia which measures up to 8.6 cm transverse on image 73/2. As above, there is air within this hernia as well which appears to be due to a small knuckle of small bowel. Musculoskeletal: No acute osseous findings. There are old rib fractures on the left. There are changes of bilateral femoral head avascular necrosis with early subchondral collapse on the right. There is edema throughout the subcutaneous fat. In the upper right buttocks, there is a globular area of increased signal which could be related to prior trauma/injection or reflect fat necrosis. IMPRESSION: 1. Right lower lobe pulmonary consolidation with complex fluid in the right pleural space, worrisome for empyema and pleural-based abscess. Full chest CT may be helpful for further evaluation. 2. Significantly progressive changes of diffuse hepatic cirrhosis compared with previous CT from 2017. Mild changes of portal hypertension with splenomegaly and venous collaterals. The main portal vein appears patent. 3. Progressive moderate volume ascites. Ascites and a small bowel loop extend into an umbilical hernia. No evidence of bowel obstruction or incarceration. 4. Chronic bilateral femoral head avascular necrosis. Electronically Signed   By: Carey Bullocks M.D.   On: 01/21/2019 19:37   US Renal  Result Date: 01/23/2019 CLINICAL DATA:  Renal failure EXAM: RENAL / URINARY  TRACT ULTRASOUND COMPLETE COMPARISON:  None. FINDINGS: Right Kidney: Renal measurements: 12.4 x 6.8 x 6.4 cm = volume: 286 mL . Echogenicity within normal limits. No mass or hydronephrosis visualized. Left Kidney: Renal measurements: 11.8 x 6 x 6.5 cm = volume: 239 mL. Echogenicity within normal limits. No mass or hydronephrosis visualized. Bladder: Appears normal for degree of bladder distention. Other: Increased hepatic echogenicity with a slightly nodular contour as can be seen with hepatic  cirrhosis. Small amount of perihepatic ascites. Splenomegaly with the spleen measuring 18.5 cm in length and a volume of 1535 mL. IMPRESSION: 1. Normal renal ultrasound. 2. Increased hepatic echogenicity with a slightly nodular contour as can be seen with hepatic cirrhosis. Small volume perihepatic ascites. Splenomegaly. Correlate with liver function test. Electronically Signed   By: Elige Ko   On: 01/23/2019 10:06   Dg Chest Port 1 View  Result Date: 01/29/2019 CLINICAL DATA:  Right chest tube removal EXAM: PORTABLE CHEST 1 VIEW COMPARISON:  Chest radiograph from one day prior. FINDINGS: Low lung volumes. Interval removal of basilar right chest tube. Stable cardiomediastinal silhouette with top-normal heart size. No pneumothorax. No left pleural effusion. Stable blunting of the right costophrenic angle. No overt pulmonary edema. Patchy right lung base opacity is unchanged. IMPRESSION: 1. No pneumothorax status post right chest tube removal. 2. Low lung volumes with stable patchy right lung base opacity and blunting of the right costophrenic angle, which could represent residual small pleural effusions/pleural thickening. Continued chest radiograph follow-up buys. Electronically Signed   By: Delbert Phenix M.D.   On: 01/29/2019 14:11   Dg Chest Port 1 View  Result Date: 01/28/2019 CLINICAL DATA:  Empyema of pleural space EXAM: PORTABLE CHEST 1 VIEW COMPARISON:  Yesterday FINDINGS: Pigtail catheter over the right base is unchanged. Unchanged pleural fluid and parenchymal opacity. Comparatively clear left chest. Normal heart size. IMPRESSION: Unchanged pleuroparenchymal opacity at the right base. Electronically Signed   By: Marnee Spring M.D.   On: 01/28/2019 09:24   Dg Chest Port 1 View  Result Date: 01/27/2019 CLINICAL DATA:  Empyema. EXAM: PORTABLE CHEST 1 VIEW COMPARISON:  Radiograph of January 26, 2019. FINDINGS: The heart size and mediastinal contours are within normal limits. Left lung is clear.  Stable position of right-sided pleural drainage catheter is noted. Right pleural effusion is unchanged. There is associated atelectasis in right lung base. No pneumothorax is noted. The visualized skeletal structures are unremarkable. IMPRESSION: Stable position of right-sided pleural drainage catheter with stable right pleural effusion and associated atelectasis. Electronically Signed   By: Lupita Raider M.D.   On: 01/27/2019 07:13   Dg Chest Port 1 View  Result Date: 01/26/2019 CLINICAL DATA:  Chest tube EXAM: PORTABLE CHEST 1 VIEW COMPARISON:  01/21/2019 FINDINGS: Right basilar chest tube is now in place. Right pleural effusion and basilar pulmonary opacity improved. Increasing small left pleural effusion. Upper lungs clear. Cardiomegaly. No pneumothorax. IMPRESSION: Improved right pleural effusion after chest tube placement. New small left pleural effusion. Electronically Signed   By: Jolaine Click M.D.   On: 01/26/2019 08:00   Dg Chest Portable 1 View  Result Date: 01/21/2019 CLINICAL DATA:  38 year old with current history of hepatic cirrhosis and, by the patient's report, recent diagnosis of pneumonia at an outside facility. EXAM: PORTABLE CHEST 1 VIEW COMPARISON:  02/21/2018 and earlier, including CTA chest 05/13/2015. FINDINGS: Cardiac silhouette moderately to markedly enlarged, unchanged since 2019 but increased in size since 2016. Pulmonary venous hypertension with perhaps minimal interstitial pulmonary edema.  Large RIGHT pleural effusion and associated consolidation in the RIGHT LOWER LOBE. Lungs otherwise clear. No visible LEFT pleural effusion. IMPRESSION: 1. Large RIGHT pleural effusion and associated passive atelectasis and/or pneumonia involving the RIGHT LOWER LOBE. 2. Stable moderate to marked cardiomegaly. Pulmonary venous hypertension with perhaps minimal interstitial pulmonary edema indicating CHF and/or fluid overload. Electronically Signed   By: Hulan Saas M.D.   On: 01/21/2019  16:25   Ct Image Guided Fluid Drain By Catheter  Result Date: 01/25/2019 INDICATION: 38 year old with right lung pneumonia, loculated right pleural fluid and a focal collection in the posterior lower chest. Findings are concerning for an empyema or lung abscess. Plan for percutaneous drainage of this posterior right chest fluid collection. EXAM: CT-GUIDED PLACEMENT OF RIGHT CHEST TUBE MEDICATIONS: No antibiotics were given for this procedure. ANESTHESIA/SEDATION: Fentanyl 50 mcg IV; Versed 1.5 mg IV Moderate Sedation Time:  41 minutes The patient was continuously monitored during the procedure by the interventional radiology nurse under my direct supervision. COMPLICATIONS: None immediate. PROCEDURE: Informed written consent was obtained from the patient after a thorough discussion of the procedural risks, benefits and alternatives. All questions were addressed. Maximal Sterile Barrier Technique was utilized including caps, mask, sterile gowns, sterile gloves, sterile drape, hand hygiene and skin antiseptic. A timeout was performed prior to the initiation of the procedure. Patient was placed prone. CT images through the lower chest were obtained. The right side of the chest was prepped and draped in sterile fashion. Skin and soft tissues anesthetized with 1% lidocaine. Using CT guidance, a Yueh catheter was directed into the right posterior lower chest fluid collection. Initially, thick yellow fluid was aspirated. Stiff Amplatz wire was advanced into the collection and the patient started to cough and became symptomatic. The wire was removed for the Yueh catheter again. Additional bloody fluid was removed from the chest collection. Eventually, the patient's coughing stopped. Subsequently, Yueh catheter was removed over a stiff Amplatz wire and the percutaneous tract was dilated to accommodate a 12 [place] drain. Approximately 100 mL of thick bloody purulent fluid was removed from the chest collection. The tube  was starting to clot and the tube was irrigated with normal saline. The tube was attached to a PleurEvac and wall suction. Additional bloody fluid was removed. Catheter was sutured to skin and a bandage was placed. FINDINGS: Again noted is a complex fluid collection in the posteromedial right lower chest adjacent to right lung consolidation. Catheter was directed into the fluid collection and thick bloody purulent fluid was removed. 12 French drain was placed. IMPRESSION: CT-guided placement of a 12 French drain within the collection in the posterior right lower chest. Findings are concerning for a lung abscess or empyema. Electronically Signed   By: Richarda Overlie M.D.   On: 01/25/2019 17:42   Ir Paracentesis  Result Date: 01/23/2019 INDICATION: Patient with history of alcoholic cirrhosis with recurrent ascites. Request is made for diagnostic and therapeutic paracentesis. EXAM: ULTRASOUND GUIDED DIAGNOSTIC AND THERAPEUTIC PARACENTESIS MEDICATIONS: 10 mL 1% lidocaine COMPLICATIONS: None immediate. PROCEDURE: Informed written consent was obtained from the patient after a discussion of the risks, benefits and alternatives to treatment. A timeout was performed prior to the initiation of the procedure. Initial ultrasound scanning demonstrates a moderate amount of ascites within the right lower abdominal quadrant. The right lower abdomen was prepped and draped in the usual sterile fashion. 1% lidocaine was used for local anesthesia. Following this, a 19 gauge, 7-cm, Yueh catheter was introduced. An ultrasound image was saved  for documentation purposes. The paracentesis was performed. The catheter was removed and a dressing was applied. The patient tolerated the procedure well without immediate post procedural complication. FINDINGS: A total of approximately 2.2 L of clear yellow fluid was removed. Samples were sent to the laboratory as requested by the clinical team. IMPRESSION: Successful ultrasound-guided paracentesis  yielding 2.2 L of peritoneal fluid. Read by: Elwin Mocha, PA-C Electronically Signed   By: Richarda Overlie M.D.   On: 01/23/2019 15:46    CBC Recent Labs  Lab 01/24/19 0447 01/25/19 0556 01/26/19 0926 01/27/19 0351 01/29/19 0048 01/30/19 0553  WBC 16.1* 14.0* 13.2* 10.7* 11.7* 17.5*  HGB 8.4* 7.5* 8.8* 8.2* 7.7* 7.4*  HCT 24.1* 21.9* 25.7* 24.5* 23.1* 22.2*  PLT 99* 85* 91* 86* 111* 130*  MCV 97.6 99.5 100.8* 101.2* 102.7* 104.7*  MCH 34.0 34.1* 34.5* 33.9 34.2* 34.9*  MCHC 34.9 34.2 34.2 33.5 33.3 33.3  RDW 14.9 15.6* 16.0* 16.7* 17.7* 18.6*  LYMPHSABS 0.5*  --   --   --   --   --   MONOABS 0.7  --   --   --   --   --   EOSABS 0.1  --   --   --   --   --   BASOSABS 0.1  --   --   --   --   --    Chemistries  Recent Labs  Lab 01/24/19 0447 01/25/19 0556 01/26/19 0926 01/27/19 0351 01/29/19 0048 01/30/19 0553  NA 124* 129* 130* 131* 132* 132*  K 4.9 4.4 4.4 4.5 4.7 5.4*  CL 96* 101 101 102 103 107  CO2 17* 17* 18* 19* 19* 15*  GLUCOSE 87 103* 109* 98 117* 98  BUN 16 21* 20 18 22* 30*  CREATININE 1.47* 1.37* 1.21 1.08 1.29* 1.85*  CALCIUM 8.6* 9.3 9.8 9.6 9.2 8.9  MG 2.0  --   --   --   --   --   AST 80* 56* 61*  --  70*  --   ALT --  26  --   ALKPHOS 105 94 106  --  97  --   BILITOT 6.7* 6.9* 8.5*  --  6.1*  --    ------------------------------------------------------------------------------------------------------------------ No results for input(s): CHOL, HDL, LDLCALC, TRIG, CHOLHDL, LDLDIRECT in the last 72 hours.  No results found for: HGBA1C ------------------------------------------------------------------------------------------------------------------ No results for input(s): TSH, T4TOTAL, T3FREE, THYROIDAB in the last 72 hours.  Invalid input(s): FREET3 ------------------------------------------------------------------------------------------------------------------ No results for input(s): VITAMINB12, FOLATE, FERRITIN, TIBC, IRON, RETICCTPCT in  the last 72 hours.  Coagulation profile Recent Labs  Lab 01/25/19 0556 01/26/19 0424 01/27/19 0351 01/28/19 0454 01/29/19 0048  INR 1.9* 1.6* 1.6* 1.4* 1.5*   No results for input(s): DDIMER in the last 72 hours.  Cardiac Enzymes No results for input(s): CKMB, TROPONINI, MYOGLOBIN in the last 168 hours.  Invalid input(s): CK ------------------------------------------------------------------------------------------------------------------    Component Value Date/Time   BNP 318.0 (H) 01/21/2019 1648   Shon Hale M.D on 01/30/2019 at 4:22 PM  Go to [email or web address] - for contact info  [facility] - Office  [phone]

## 2019-01-31 ENCOUNTER — Inpatient Hospital Stay (HOSPITAL_COMMUNITY): Payer: Medicaid - Out of State

## 2019-01-31 LAB — COMPREHENSIVE METABOLIC PANEL
ALT: 26 U/L (ref 0–44)
AST: 65 U/L — ABNORMAL HIGH (ref 15–41)
Albumin: 2.3 g/dL — ABNORMAL LOW (ref 3.5–5.0)
Alkaline Phosphatase: 87 U/L (ref 38–126)
Anion gap: 11 (ref 5–15)
BUN: 36 mg/dL — ABNORMAL HIGH (ref 6–20)
CO2: 15 mmol/L — ABNORMAL LOW (ref 22–32)
Calcium: 8.8 mg/dL — ABNORMAL LOW (ref 8.9–10.3)
Chloride: 107 mmol/L (ref 98–111)
Creatinine, Ser: 1.3 mg/dL — ABNORMAL HIGH (ref 0.61–1.24)
GFR calc Af Amer: >60 mL/min (ref >60)
GFR calc non Af Amer: >60 mL/min (ref >60)
Glucose, Bld: 90 mg/dL (ref 70–99)
Potassium: 4.5 mmol/L (ref 3.5–5.1)
Sodium: 133 mmol/L — ABNORMAL LOW (ref 135–145)
Total Bilirubin: 4.8 mg/dL — ABNORMAL HIGH (ref 0.3–1.2)
Total Protein: 7.9 g/dL (ref 6.5–8.1)

## 2019-01-31 LAB — CBC
HCT: 21.3 % — ABNORMAL LOW (ref 39.0–52.0)
Hemoglobin: 6.9 g/dL — CL (ref 13.0–17.0)
MCH: 34 pg (ref 26.0–34.0)
MCHC: 32.4 g/dL (ref 30.0–36.0)
MCV: 104.9 fL — ABNORMAL HIGH (ref 80.0–100.0)
Platelets: 122 10*3/uL — ABNORMAL LOW (ref 150–400)
RBC: 2.03 MIL/uL — ABNORMAL LOW (ref 4.22–5.81)
RDW: 18.2 % — ABNORMAL HIGH (ref 11.5–15.5)
WBC: 11.8 10*3/uL — ABNORMAL HIGH (ref 4.0–10.5)
nRBC: 0 % (ref 0.0–0.2)

## 2019-01-31 LAB — PREPARE RBC (CROSSMATCH)

## 2019-01-31 MED ORDER — FUROSEMIDE 20 MG PO TABS: 20.0000 mg | ORAL_TABLET | Freq: Every day | ORAL | Status: DC

## 2019-01-31 MED ORDER — FUROSEMIDE 10 MG/ML IJ SOLN
20.0000 mg | Freq: Once | INTRAMUSCULAR | Status: AC
  Administered 2019-01-31: 20 mg via INTRAVENOUS
  Filled 2019-01-31: qty 2

## 2019-01-31 MED ORDER — SODIUM CHLORIDE 0.9% IV SOLUTION
Freq: Once | INTRAVENOUS | Status: AC
  Administered 2019-01-31: 08:00:00 via INTRAVENOUS

## 2019-01-31 MED ORDER — FUROSEMIDE 20 MG PO TABS
20.0000 mg | ORAL_TABLET | Freq: Every day | ORAL | Status: DC
  Administered 2019-02-01: 20 mg via ORAL
  Filled 2019-01-31: qty 1

## 2019-01-31 NOTE — Progress Notes (Signed)
Patient Demographics:    Grant [name], is a 38 y.o. male, DOB - 10-08-1980, ZOX:[id number]  Admit date - 01/21/2019   Admitting Physician Glade Lloyd, MD  Outpatient Primary MD for the patient is Patient, No Pcp Per  LOS - 10  Chief Complaint  Patient presents with   Shortness of Breath        Subjective:    Grant [name] States today has no fevers, no emesis , some orthostatic dizziness today, continues to have dyspnea on exertion..... Hemoglobin down to 6.9   Assessment  & Plan :    Principal Problem:   Acute respiratory failure with hypoxia (HCC) Active Problems:   Alcoholic cirrhosis of liver with ascites (HCC)   Esophageal reflux   Morbid obesity due to excess calories (HCC)   Hypokalemia   Ascites   RLL pneumonia (HCC)   Empyema of right pleural space (HCC)   Hyponatremia   Supratherapeutic INR   Lactic acidosis  Brief Summary 38 year old male with history of alcoholic cirrhosis, portal vein thrombosis diagnosed in 06/2018 for which he is currently on Xarelto, severe microcytic anemia, GERD, splenomegaly presented with shortness of breath and cough worsening over the past 3 weeks, with COVID-19 testing done as an outpatient 3 weeks PTA apparently negative, repeat COVID test on 01/21/2019 at our facility was also negative. Admitted on 01/21/2027 right lower lobe pneumonia with loculated pleural fluid, likely empyema Versus Abscess.  He was also found to have elevated INR along with hyponatremia.  CT surgery was consulted and recommends percutaneous drainage by interventional radiology, s/p Rt pleural fluid drain/chest tube placement on 01/25/2019, chest tube removed 01/29/2019  A/p 1)Rt LL E  Coli pneumonia with loculated pleural fluid/Empyema Versus Abscess.---- s/p  percutaneous drainage by IR on 01/25/2019, vancomycin discontinued on 01/23/2019, pleural fluid culture from 01/25/2019 growing pansensitive  E. coli, switched from IV cefepime to IV Rocephin starting 01/27/2019 , strep pneumo antigen negative ,  WBC down to 11.8, hypoxia has resolved, continues to have dyspnea on exertion--- hold off for repeat chest CT as patient clinically appears to be improving slowly S/p (R)pigtail chest tube on 01/25/19,   chest tube was removed on 01/29/19,......  Follow-up CXR on 01/29/2019 and 01/30/2019 with residual right-sided infected fluid ,no pneumothorax   2)SBP--- paracentesis fluid from 01/23/2019 with 258 ANC consistent with SBP,switched from IV cefepime to IV Rocephin starting 01/27/2019--please see #1 above, paracentesis fluid cx NGTD  3)Supratherapeutic INR--- liver disease with some degree of auto anticoagulation--- PTA was on Xarelto for portal vein thrombosis, patient received vitamin K, INR 1.5  4)Alcoholic liver cirrhosis with ascites--- status post paracentesis on 01/23/2019--- denies recent alcohol use,,  Continue folic acid and MVI.  C/n  lactulose 10 g daily..... No evidence of encephalopathy at this time, serum ammonia is 57, Lasix and Aldactone were placed on hold due to kidney concerns-----patient will need low-dose Aldactone and Lasix upon discharge home avoid frequent paracentesis... Lasix restarted 01/31/2019  5)FEN/hypokalemia/hypomagnesemia//hyponatremia--- continue to to monitor and replace as indicated  6)AKI--- possibly contrast induced ATN, nephrology consult appreciated,, ultrasound without significant abnormalities...., Creatinine baseline was 0.6, creatinine is down to 1.3 from 1.85,  Continue to avoid nephrotoxic agents , Lasix and Aldactone currently on hold, will restart Lasix but continue  to hold Aldactone  7)Chronic Thrombocytopenia/chronic Anemia-----most likely due to underlying liver disease/cirrhosis, no evidence of ongoing bleeding,  Hgb is down to 6.9--- patient with orthostatic dizziness and some dyspnea on exertion transfuse 1 unit of PRBcs due to symptomatic anemia,   continue to  monitor--no indication for transfusion at this time, platelet count is up to 122 k  Disposition/Need for in-Hospital Stay- patient unable to be discharged at this time due to right lung empyema/abscess requiring percutaneous drainage/tube placement and IV antibiotics , as per cardiothoracic surgeon - Hgb is down to 6.9--- patient with orthostatic dizziness and some dyspnea on exertion transfuse 1 unit of PRBcs due to symptomatic anemia-possible discharge on 02/01/2019 if H&H is stable on patient is less symptomatic after transfusion  Code Status : full  Family Communication:   na  Disposition Plan  : Possibly home on 02/01/2019 if H&H is stable on patient is less symptomatic after transfusion Procedure: 01/25/19  CT guided chest drain placement  chest tube was removed on 01/29/19, Findings: Thick wall collection in right posterior lower chest.  Thick bloody purulent fluid aspirated, approximately 100 ml.  12 Fr drain placed.  Thick bloody fluid draining at end of procedure.   Rt CT Removed 01/29/19  Consults  : CT surgery/IR  DVT Prophylaxis  :  - SCDs/Xarelto on hold  Lab Results  Component Value Date   PLT 122 (L) 01/31/2019   Inpatient Medications  Scheduled Meds:  folic acid  1 mg Oral Daily   furosemide  20 mg Intravenous Once   lactulose  10 g Oral Daily   magnesium oxide  400 mg Oral Daily   mouth rinse  15 mL Mouth Rinse BID   multivitamin with minerals  1 tablet Oral Daily   pantoprazole  40 mg Oral Daily   propranolol  10 mg Oral TID   thiamine  100 mg Oral Daily   Continuous Infusions:  cefTRIAXone (ROCEPHIN)  IV 2 g (01/30/19 1552)   PRN Meds:.benzonatate, guaiFENesin-dextromethorphan    Anti-infectives (From admission, onward)   Start     Dose/Rate Route Frequency Ordered Stop   01/27/19 1600  cefTRIAXone (ROCEPHIN) 2 g in sodium chloride 0.9 % 100 mL IVPB     2 g 200 mL/hr over 30 Minutes Intravenous Every 24 hours 01/27/19 1345     01/22/19 1000   ceFEPIme (MAXIPIME) 2 g in sodium chloride 0.9 % 100 mL IVPB  Status:  Discontinued     2 g 200 mL/hr over 30 Minutes Intravenous Every 8 hours 01/22/19 0904 01/27/19 1345   01/22/19 0400  vancomycin (VANCOCIN) IVPB 1000 mg/200 mL premix  Status:  Discontinued     1,000 mg 200 mL/hr over 60 Minutes Intravenous Every 12 hours 01/21/19 1820 01/23/19 0855   01/21/19 1800  vancomycin (VANCOCIN) IVPB 1000 mg/200 mL premix     1,000 mg 200 mL/hr over 60 Minutes Intravenous Every 1 hr x 2 01/21/19 1703 01/21/19 2042   01/21/19 1645  ceFEPIme (MAXIPIME) 1 g in sodium chloride 0.9 % 100 mL IVPB     1 g 200 mL/hr over 30 Minutes Intravenous  Once 01/21/19 1633 01/21/19 1802        Objective:   Vitals:   01/31/19 0730 01/31/19 0808 01/31/19 1038 01/31/19 1537  BP: (!) 99/37 (!) 108/39 115/67 (!) 108/59  Pulse: 82 84 83 94  Resp: Temp: 98.3 F (36.8 C) 98.5 F (36.9 C) 97.7 F (36.5 C) 97.9  F (36.6 C)  TempSrc: Oral Oral Oral Oral  SpO2: 95% 95% 94% 96%  Weight:      Height:        Wt Readings from Last 3 Encounters:  01/31/19 94 kg  11/04/18 100.5 kg  10/11/18 100.7 kg     Intake/Output Summary (Last 24 hours) at 01/31/2019 1605 Last data filed at 01/31/2019 1524 Gross per 24 hour  Intake 934 ml  Output 300 ml  Net 634 ml   Physical Exam Patient is examined daily including today on 01/31/19 , exams remain the same as of yesterday except that has changed   Gen:- Awake Alert, in no acute distress HEENT:- [place].AT, No sclera icterus Neck-Supple Neck,No JVD,.  Lungs-diminished in right lung base , no wheezing CV- S1, S2 normal, regular  Abd-umbilical hernia, +ve Ascites, +ve abdominal distention, no significant tenderness Extremity/Skin:- 1 +ve edema, pedal pulses present  Psych-affect is appropriate, oriented x3 Neuro-no new focal deficits,     Data Review:   Micro Results Recent Results (from the past 240 hour(s))  Culture, blood (Routine X 2) w Reflex to ID  Panel     Status: None   Collection Time: 01/21/19  4:53 PM  Result Value Ref Range Status   Specimen Description   Final    LEFT ANTECUBITAL BOTTLES DRAWN AEROBIC AND ANAEROBIC   Special Requests Blood Culture adequate volume  Final   Culture   Final    NO GROWTH 5 DAYS Performed at [facility], [address], [place], [place] [address]    Report Status 01/26/2019 FINAL  Final  Culture, blood (Routine X 2) w Reflex to ID Panel     Status: None   Collection Time: 01/21/19  4:54 PM  Result Value Ref Range Status   Specimen Description BLOOD LEFT ARM BOTTLES DRAWN AEROBIC ONLY  Final   Special Requests Blood Culture adequate volume  Final   Culture   Final    NO GROWTH 5 DAYS Performed at [facility], [address] [place], [place] [address]    Report Status 01/26/2019 FINAL  Final  Urine Culture     Status: None   Collection Time: 01/21/19  5:31 PM  Result Value Ref Range Status   Specimen Description   Final    URINE, CLEAN CATCH Performed at [facility], [address] [place], [place] [address]    Special Requests   Final    NONE Performed at [facility], [address], [place], [place] [address]    Culture   Final    NO GROWTH Performed at [facility], [address] [place], [place] [address]    Report Status 01/23/2019 FINAL  Final  MRSA PCR Screening     Status: None   Collection Time: 01/22/19  1:00 AM  Result Value Ref Range Status   MRSA by PCR NEGATIVE NEGATIVE Final    Comment:        The GeneXpert MRSA Assay (FDA approved for NASAL specimens only), is one component of a comprehensive MRSA colonization surveillance program. It is not intended to diagnose MRSA infection nor to guide or monitor treatment for MRSA infections. Performed at [facility], [address], [place], [place] [address]   Culture, sputum-assessment     Status: None   Collection Time: 01/22/19  5:02 AM  Result Value Ref Range Status   Specimen Description  SPUTUM  Final   Special Requests Normal  Final   Sputum  evaluation   Final    THIS SPECIMEN IS ACCEPTABLE FOR SPUTUM CULTURE Performed at [facility], [address], [place], [place] [address]    Report Status 01/22/2019 FINAL  Final  Culture, respiratory     Status: None   Collection Time: 01/22/19  5:02 AM  Result Value Ref Range Status   Specimen Description SPUTUM  Final   Special Requests Normal Reflexed from [id number]  Final   Gram Stain   Final    MODERATE WBC PRESENT, PREDOMINANTLY PMN FEW GRAM NEGATIVE RODS RARE GRAM POSITIVE COCCI IN PAIRS    Culture   Final    Consistent with normal respiratory flora. Performed at [facility], [address], [place], [place] [address]    Report Status 01/24/2019 FINAL  Final  Gram stain     Status: None   Collection Time: 01/23/19  3:18 PM  Result Value Ref Range Status   Specimen Description FLUID PERITONEAL  Final   Special Requests NONE  Final   Gram Stain   Final    RARE WBC PRESENT,BOTH PMN AND MONONUCLEAR NO ORGANISMS SEEN Performed at [facility], [address] [place], [place] [address]    Report Status 01/23/2019 FINAL  Final  Culture, body fluid-bottle     Status: None   Collection Time: 01/23/19  3:18 PM  Result Value Ref Range Status   Specimen Description FLUID PERITONEAL  Final   Special Requests BOTTLES DRAWN AEROBIC AND ANAEROBIC 10CC  Final   Culture   Final    NO GROWTH 5 DAYS Performed at [facility], [address], [place], [place] [address]    Report Status 01/28/2019 FINAL  Final  Aerobic/Anaerobic Culture (surgical/deep wound)     Status: None   Collection Time: 01/25/19  3:57 PM  Result Value Ref Range Status   Specimen Description LUNG RIGHT PLEURAL  Final   Special Requests NONE  Final   Gram Stain   Final    ABUNDANT WBC PRESENT,BOTH PMN AND MONONUCLEAR ABUNDANT GRAM NEGATIVE RODS    Culture   Final    MODERATE ESCHERICHIA COLI NO ANAEROBES ISOLATED Performed at  [facility], [address], [place], [place] [address]    Report Status 01/30/2019 FINAL  Final   Organism ID, Bacteria ESCHERICHIA COLI  Final      Susceptibility   Escherichia coli - MIC*    AMPICILLIN 4 SENSITIVE Sensitive     CEFAZOLIN <=4 SENSITIVE Sensitive     CEFEPIME <=1 SENSITIVE Sensitive     CEFTAZIDIME <=1 SENSITIVE Sensitive     CEFTRIAXONE <=1 SENSITIVE Sensitive     CIPROFLOXACIN <=0.25 SENSITIVE Sensitive     GENTAMICIN <=1 SENSITIVE Sensitive     IMIPENEM <=0.25 SENSITIVE Sensitive     TRIMETH/SULFA >=320 RESISTANT Resistant     AMPICILLIN/SULBACTAM <=2 SENSITIVE Sensitive     PIP/TAZO <=4 SENSITIVE Sensitive     Extended ESBL NEGATIVE Sensitive     * MODERATE ESCHERICHIA COLI    Radiology Reports Dg Chest 2 View  Result Date: 01/31/2019 CLINICAL DATA:  Shortness of breath EXAM: CHEST - 2 VIEW COMPARISON:  Yesterday FINDINGS: Unchanged small or moderate right pleural effusion with opacified underlying lung. Comparatively clear left chest. Stable generous heart size. IMPRESSION: Stable right pleural effusion and lower lobe opacification. Electronically Signed   By: Marnee Spring M.D.   On: 01/31/2019 09:23   Dg Chest 2 View  Result Date: 01/30/2019  CLINICAL DATA:  Chest tube removal. History of right lower lobe pneumonia and empyema. EXAM: CHEST - 2 VIEW COMPARISON:  Chest x-ray from yesterday. FINDINGS: Stable cardiomediastinal silhouette. Normal pulmonary vascularity. Persistent moderate loculated right pleural effusion and right lower lobe consolidation. Minimal atelectasis at the left lung base. No pneumothorax. No acute osseous abnormality. IMPRESSION: 1. Persistent moderate loculated right pleural effusion and right lower lobe pneumonia. Electronically Signed   By: Obie Dredge M.D.   On: 01/30/2019 08:15   Ct Chest Wo Contrast  Result Date: 01/21/2019 CLINICAL DATA:  Right lower lobe opacity and complex pleural fluid seen on prior abdominal CT. EXAM:  CT CHEST WITHOUT CONTRAST TECHNIQUE: Multidetector CT imaging of the chest was performed following the standard protocol without IV contrast. COMPARISON:  CT abdomen 01/21/2019 FINDINGS: Cardiovascular: Heart is normal size. Aorta is normal caliber. Coronary artery calcification in the left anterior descending coronary artery. Mediastinum/Nodes: No mediastinal, hilar, or axillary adenopathy. Lungs/Pleura: Dense consolidation noted in the right lower lobe compatible with pneumonia. Loculated pleural fluid noted along the right heart border and superior laterally in the right hemithorax. There is a rounded fluid collection posteriorly in the right lower hemithorax measuring 7.2 cm concerning for empyema or pulmonary abscess. On coronal imaging, this extends over a craniocaudal length of 10 cm. Dependent atelectasis in the left lower lobe. Upper Abdomen: Changes of cirrhosis with ascites. Musculoskeletal: No acute bony abnormality. Chest wall soft tissues are unremarkable. IMPRESSION: Dense consolidation in the right lower lobe compatible with pneumonia. Loculated right pleural fluid. Rounded fluid collection posteriorly in the right lower hemithorax which could be loculated fluid and reflect empyema or pulmonary abscess. Coronary artery disease. Cirrhosis, ascites. Electronically Signed   By: Charlett Nose M.D.   On: 01/21/2019 20:28   Ct Abdomen Pelvis W Contrast  Result Date: 01/21/2019 CLINICAL DATA:  Coughing since January. History of thrombocytopenia, splenomegaly and cirrhosis. Recently diagnosed with pneumonia. EXAM: CT ABDOMEN AND PELVIS WITH CONTRAST TECHNIQUE: Multidetector CT imaging of the abdomen and pelvis was performed using the standard protocol following bolus administration of intravenous contrast. CONTRAST:  OMNIPAQUE IOHEXOL 300 MG/ML  SOLN COMPARISON:  Chest radiographs today. Abdominopelvic CT 05/08/2016. Right upper quadrant ultrasound 06/30/2018. FINDINGS: Lower chest: The lung bases  are incompletely visualized. There is consolidation of the visualized right lower lobe with a large loculated right pleural effusion. This has a component posteriorly with thick peripheral enhancement, measuring up to 7.9 x 5.2 cm on image 9/2. Based on the provided history, this is suspicious for empyema and possible pleural-based abscess. There is minimal atelectasis at the left lung base and no significant left pleural effusion. The azygos and hemi azygous veins are mildly dilated. Hepatobiliary: There are progressive changes of cirrhosis throughout the liver with diffuse contour irregularity and parenchymal heterogeneity. No dominant mass or focally abnormal enhancement identified. The portal vein is patent. No evidence of gallstones, gallbladder wall thickening or biliary dilatation. Pancreas: Unremarkable. No pancreatic ductal dilatation or surrounding inflammatory changes. Spleen: Measures approximately 14.7 x 7.5 x 12.3 cm (volume = 710 cm^3) consistent with mild splenomegaly. No focal abnormality. Adrenals/Urinary Tract: Both adrenal glands appear normal. The kidneys, ureters and bladder appear normal. Stomach/Bowel: No evidence of bowel wall thickening, distention or surrounding inflammatory change. There is a large amount of stool within the rectum. There is a probable loop of small bowel extending into an umbilical hernia. No evidence of bowel obstruction or incarceration. Vascular/Lymphatic: There are no enlarged abdominal or pelvic lymph nodes. Minimal  aortic atherosclerosis. No acute vascular findings are identified. 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 The portal, superior mesenteric and splenic veins appear patent. Reproductive: The prostate gland and seminal vesicles appear normal. Other: Interval development of a moderate amount of ascites. Fluid extends into an umbilical hernia which measures up to 8.6 cm transverse on image 73/2. As above, there is air within  this hernia as well which appears to be due to a small knuckle of small bowel. Musculoskeletal: No acute osseous findings. There are old rib fractures on the left. There are changes of bilateral femoral head avascular necrosis with early subchondral collapse on the right. There is edema throughout the subcutaneous fat. In the upper right buttocks, there is a globular area of increased signal which could be related to prior trauma/injection or reflect fat necrosis. IMPRESSION: 1. Right lower lobe pulmonary consolidation with complex fluid in the right pleural space, worrisome for empyema and pleural-based abscess. Full chest CT may be helpful for further evaluation. 2. Significantly progressive changes of diffuse hepatic cirrhosis compared with previous CT from 2017. Mild changes of portal hypertension with splenomegaly and venous collaterals. The main portal vein appears patent. 3. Progressive moderate volume ascites. Ascites and a small bowel loop extend into an umbilical hernia. No evidence of bowel obstruction or incarceration. 4. Chronic bilateral femoral head avascular necrosis. Electronically Signed   By: Carey Bullocks M.D.   On: 01/21/2019 19:37   US Renal  Result Date: 01/23/2019 CLINICAL DATA:  Renal failure EXAM: RENAL / URINARY TRACT ULTRASOUND COMPLETE COMPARISON:  None. FINDINGS: Right Kidney: Renal measurements: 12.4 x 6.8 x 6.4 cm = volume: 286 mL . Echogenicity within normal limits. No mass or hydronephrosis visualized. Left Kidney: Renal measurements: 11.8 x 6 x 6.5 cm = volume: 239 mL. Echogenicity within normal limits. No mass or hydronephrosis visualized. Bladder: Appears normal for degree of bladder distention. Other: Increased hepatic echogenicity with a slightly nodular contour as can be seen with hepatic cirrhosis. Small amount of perihepatic ascites. Splenomegaly with the spleen measuring 18.5 cm in length and a volume of 1535 mL. IMPRESSION: 1. Normal renal ultrasound. 2. Increased  hepatic echogenicity with a slightly nodular contour as can be seen with hepatic cirrhosis. Small volume perihepatic ascites. Splenomegaly. Correlate with liver function test. Electronically Signed   By: Elige Ko   On: 01/23/2019 10:06   Dg Chest Port 1 View  Result Date: 01/29/2019 CLINICAL DATA:  Right chest tube removal EXAM: PORTABLE CHEST 1 VIEW COMPARISON:  Chest radiograph from one day prior. FINDINGS: Low lung volumes. Interval removal of basilar right chest tube. Stable cardiomediastinal silhouette with top-normal heart size. No pneumothorax. No left pleural effusion. Stable blunting of the right costophrenic angle. No overt pulmonary edema. Patchy right lung base opacity is unchanged. IMPRESSION: 1. No pneumothorax status post right chest tube removal. 2. Low lung volumes with stable patchy right lung base opacity and blunting of the right costophrenic angle, which could represent residual small pleural effusions/pleural thickening. Continued chest radiograph follow-up buys. Electronically Signed   By: Delbert Phenix M.D.   On: 01/29/2019 14:11   Dg Chest Port 1 View  Result Date: 01/28/2019 CLINICAL DATA:  Empyema of pleural space EXAM: PORTABLE CHEST 1 VIEW COMPARISON:  Yesterday FINDINGS: Pigtail catheter over the right base is unchanged. Unchanged pleural fluid and parenchymal opacity. Comparatively clear left chest. Normal heart size. IMPRESSION: Unchanged pleuroparenchymal opacity at the right base. Electronically Signed   By: Marnee Spring  M.D.   On: 01/28/2019 09:24   Dg Chest Port 1 View  Result Date: 01/27/2019 CLINICAL DATA:  Empyema. EXAM: PORTABLE CHEST 1 VIEW COMPARISON:  Radiograph of January 26, 2019. FINDINGS: The heart size and mediastinal contours are within normal limits. Left lung is clear. Stable position of right-sided pleural drainage catheter is noted. Right pleural effusion is unchanged. There is associated atelectasis in right lung base. No pneumothorax is noted. The  visualized skeletal structures are unremarkable. IMPRESSION: Stable position of right-sided pleural drainage catheter with stable right pleural effusion and associated atelectasis. Electronically Signed   By: Lupita Raider M.D.   On: 01/27/2019 07:13   Dg Chest Port 1 View  Result Date: 01/26/2019 CLINICAL DATA:  Chest tube EXAM: PORTABLE CHEST 1 VIEW COMPARISON:  01/21/2019 FINDINGS: Right basilar chest tube is now in place. Right pleural effusion and basilar pulmonary opacity improved. Increasing small left pleural effusion. Upper lungs clear. Cardiomegaly. No pneumothorax. IMPRESSION: Improved right pleural effusion after chest tube placement. New small left pleural effusion. Electronically Signed   By: Jolaine Click M.D.   On: 01/26/2019 08:00   Dg Chest Portable 1 View  Result Date: 01/21/2019 CLINICAL DATA:  38 year old with current history of hepatic cirrhosis and, by the patient's report, recent diagnosis of pneumonia at an outside facility. EXAM: PORTABLE CHEST 1 VIEW COMPARISON:  02/21/2018 and earlier, including CTA chest 05/13/2015. FINDINGS: Cardiac silhouette moderately to markedly enlarged, unchanged since 2019 but increased in size since 2016. Pulmonary venous hypertension with perhaps minimal interstitial pulmonary edema. Large RIGHT pleural effusion and associated consolidation in the RIGHT LOWER LOBE. Lungs otherwise clear. No visible LEFT pleural effusion. IMPRESSION: 1. Large RIGHT pleural effusion and associated passive atelectasis and/or pneumonia involving the RIGHT LOWER LOBE. 2. Stable moderate to marked cardiomegaly. Pulmonary venous hypertension with perhaps minimal interstitial pulmonary edema indicating CHF and/or fluid overload. Electronically Signed   By: Hulan Saas M.D.   On: 01/21/2019 16:25   Ct Image Guided Fluid Drain By Catheter  Result Date: 01/25/2019 INDICATION: 38 year old with right lung pneumonia, loculated right pleural fluid and a focal collection in  the posterior lower chest. Findings are concerning for an empyema or lung abscess. Plan for percutaneous drainage of this posterior right chest fluid collection. EXAM: CT-GUIDED PLACEMENT OF RIGHT CHEST TUBE MEDICATIONS: No antibiotics were given for this procedure. ANESTHESIA/SEDATION: Fentanyl 50 mcg IV; Versed 1.5 mg IV Moderate Sedation Time:  41 minutes The patient was continuously monitored during the procedure by the interventional radiology nurse under my direct supervision. COMPLICATIONS: None immediate. PROCEDURE: Informed written consent was obtained from the patient after a thorough discussion of the procedural risks, benefits and alternatives. All questions were addressed. Maximal Sterile Barrier Technique was utilized including caps, mask, sterile gowns, sterile gloves, sterile drape, hand hygiene and skin antiseptic. A timeout was performed prior to the initiation of the procedure. Patient was placed prone. CT images through the lower chest were obtained. The right side of the chest was prepped and draped in sterile fashion. Skin and soft tissues anesthetized with 1% lidocaine. Using CT guidance, a Yueh catheter was directed into the right posterior lower chest fluid collection. Initially, thick yellow fluid was aspirated. Stiff Amplatz wire was advanced into the collection and the patient started to cough and became symptomatic. The wire was removed for the Yueh catheter again. Additional bloody fluid was removed from the chest collection. Eventually, the patient's coughing stopped. Subsequently, Yueh catheter was removed over a stiff Amplatz wire and  the percutaneous tract was dilated to accommodate a 12 [place] drain. Approximately 100 mL of thick bloody purulent fluid was removed from the chest collection. The tube was starting to clot and the tube was irrigated with normal saline. The tube was attached to a PleurEvac and wall suction. Additional bloody fluid was removed. Catheter was sutured to  skin and a bandage was placed. FINDINGS: Again noted is a complex fluid collection in the posteromedial right lower chest adjacent to right lung consolidation. Catheter was directed into the fluid collection and thick bloody purulent fluid was removed. 12 French drain was placed. IMPRESSION: CT-guided placement of a 12 French drain within the collection in the posterior right lower chest. Findings are concerning for a lung abscess or empyema. Electronically Signed   By: Richarda Overlie M.D.   On: 01/25/2019 17:42   Ir Paracentesis  Result Date: 01/23/2019 INDICATION: Patient with history of alcoholic cirrhosis with recurrent ascites. Request is made for diagnostic and therapeutic paracentesis. EXAM: ULTRASOUND GUIDED DIAGNOSTIC AND THERAPEUTIC PARACENTESIS MEDICATIONS: 10 mL 1% lidocaine COMPLICATIONS: None immediate. PROCEDURE: Informed written consent was obtained from the patient after a discussion of the risks, benefits and alternatives to treatment. A timeout was performed prior to the initiation of the procedure. Initial ultrasound scanning demonstrates a moderate amount of ascites within the right lower abdominal quadrant. The right lower abdomen was prepped and draped in the usual sterile fashion. 1% lidocaine was used for local anesthesia. Following this, a 19 gauge, 7-cm, Yueh catheter was introduced. An ultrasound image was saved for documentation purposes. The paracentesis was performed. The catheter was removed and a dressing was applied. The patient tolerated the procedure well without immediate post procedural complication. FINDINGS: A total of approximately 2.2 L of clear yellow fluid was removed. Samples were sent to the laboratory as requested by the clinical team. IMPRESSION: Successful ultrasound-guided paracentesis yielding 2.2 L of peritoneal fluid. Read by: Elwin Mocha, PA-C Electronically Signed   By: Richarda Overlie M.D.   On: 01/23/2019 15:46    CBC Recent Labs  Lab 01/26/19 0926  01/27/19 0351 01/29/19 0048 01/30/19 0553 01/31/19 0517  WBC 13.2* 10.7* 11.7* 17.5* 11.8*  HGB 8.8* 8.2* 7.7* 7.4* 6.9*  HCT 25.7* 24.5* 23.1* 22.2* 21.3*  PLT 91* 86* 111* 130* 122*  MCV 100.8* 101.2* 102.7* 104.7* 104.9*  MCH 34.5* 33.9 34.2* 34.9* 34.0  MCHC 34.2 33.5 33.3 33.3 32.4  RDW 16.0* 16.7* 17.7* 18.6* 18.2*   Chemistries  Recent Labs  Lab 01/25/19 0556 01/26/19 0926 01/27/19 0351 01/29/19 0048 01/30/19 0553 01/31/19 0517  NA 129* 130* 131* 132* 132* 133*  K 4.4 4.4 4.5 4.7 5.4* 4.5  CL 101 101 102 103 107 107  CO2 17* 18* 19* 19* 15* 15*  GLUCOSE 103* 109* 98 117* 98 90  BUN 21* 20 18 22* 30* 36*  CREATININE 1.37* 1.21 1.08 1.29* 1.85* 1.30*  CALCIUM 9.3 9.8 9.6 9.2 8.9 8.8*  AST 56* 61*  --  70*  --  65*  ALT 24 26  --  26  --  26  ALKPHOS 94 106  --  97  --  87  BILITOT 6.9* 8.5*  --  6.1*  --  4.8*   ------------------------------------------------------------------------------------------------------------------ No results for input(s): CHOL, HDL, LDLCALC, TRIG, CHOLHDL, LDLDIRECT in the last 72 hours.  No results found for: HGBA1C ------------------------------------------------------------------------------------------------------------------ No results for input(s): TSH, T4TOTAL, T3FREE, THYROIDAB in the last 72 hours.  Invalid input(s): FREET3 ------------------------------------------------------------------------------------------------------------------ No results for input(s):  VITAMINB12, FOLATE, FERRITIN, TIBC, IRON, RETICCTPCT in the last 72 hours.  Coagulation profile Recent Labs  Lab 01/25/19 0556 01/26/19 0424 01/27/19 0351 01/28/19 0454 01/29/19 0048  INR 1.9* 1.6* 1.6* 1.4* 1.5*   No results for input(s): DDIMER in the last 72 hours.  Cardiac Enzymes No results for input(s): CKMB, TROPONINI, MYOGLOBIN in the last 168 hours.  Invalid input(s):  CK ------------------------------------------------------------------------------------------------------------------    Component Value Date/Time   BNP 318.0 (H) 01/21/2019 1648   Shon Haleourage Dujuan Stankowski M.D on 01/31/2019 at 4:05 PM  Go to [email or web address] - for contact info  [facility] - Office  [phone]

## 2019-02-01 ENCOUNTER — Telehealth: Payer: Self-pay

## 2019-02-01 LAB — TYPE AND SCREEN
ABO/RH(D): A POS
Antibody Screen: NEGATIVE
Unit division: 0

## 2019-02-01 LAB — COMPREHENSIVE METABOLIC PANEL
ALT: 30 U/L (ref 0–44)
AST: 85 U/L — ABNORMAL HIGH (ref 15–41)
Albumin: 2.2 g/dL — ABNORMAL LOW (ref 3.5–5.0)
Alkaline Phosphatase: 96 U/L (ref 38–126)
Anion gap: 11 (ref 5–15)
BUN: 23 mg/dL — ABNORMAL HIGH (ref 6–20)
CO2: 16 mmol/L — ABNORMAL LOW (ref 22–32)
Calcium: 8.9 mg/dL (ref 8.9–10.3)
Chloride: 107 mmol/L (ref 98–111)
Creatinine, Ser: 0.95 mg/dL (ref 0.61–1.24)
GFR calc Af Amer: >60 mL/min (ref >60)
GFR calc non Af Amer: >60 mL/min (ref >60)
Glucose, Bld: 91 mg/dL (ref 70–99)
Potassium: 4.6 mmol/L (ref 3.5–5.1)
Sodium: 134 mmol/L — ABNORMAL LOW (ref 135–145)
Total Bilirubin: 3.8 mg/dL — ABNORMAL HIGH (ref 0.3–1.2)
Total Protein: 7.7 g/dL (ref 6.5–8.1)

## 2019-02-01 LAB — BASIC METABOLIC PANEL
Anion gap: 9 (ref 5–15)
BUN: 29 mg/dL — ABNORMAL HIGH (ref 6–20)
CO2: 15 mmol/L — ABNORMAL LOW (ref 22–32)
Calcium: 8.7 mg/dL — ABNORMAL LOW (ref 8.9–10.3)
Chloride: 110 mmol/L (ref 98–111)
Creatinine, Ser: 1.17 mg/dL (ref 0.61–1.24)
GFR calc Af Amer: >60 mL/min (ref >60)
GFR calc non Af Amer: >60 mL/min (ref >60)
Glucose, Bld: 85 mg/dL (ref 70–99)
Potassium: 4.3 mmol/L (ref 3.5–5.1)
Sodium: 134 mmol/L — ABNORMAL LOW (ref 135–145)

## 2019-02-01 LAB — BPAM RBC
Blood Product Expiration Date: 202005082359
ISSUE DATE / TIME: 202005050739
Unit Type and Rh: 6200

## 2019-02-01 LAB — HEMOGLOBIN AND HEMATOCRIT, BLOOD
HCT: 24.2 % — ABNORMAL LOW (ref 39.0–52.0)
Hemoglobin: 8 g/dL — ABNORMAL LOW (ref 13.0–17.0)

## 2019-02-01 LAB — CBC
HCT: 22 % — ABNORMAL LOW (ref 39.0–52.0)
Hemoglobin: 7.3 g/dL — ABNORMAL LOW (ref 13.0–17.0)
MCH: 34.4 pg — ABNORMAL HIGH (ref 26.0–34.0)
MCHC: 33.2 g/dL (ref 30.0–36.0)
MCV: 103.8 fL — ABNORMAL HIGH (ref 80.0–100.0)
Platelets: 132 10*3/uL — ABNORMAL LOW (ref 150–400)
RBC: 2.12 MIL/uL — ABNORMAL LOW (ref 4.22–5.81)
RDW: 19.3 % — ABNORMAL HIGH (ref 11.5–15.5)
WBC: 8.9 10*3/uL (ref 4.0–10.5)
nRBC: 0 % (ref 0.0–0.2)

## 2019-02-01 MED ORDER — FUROSEMIDE 40 MG PO TABS
40.0000 mg | ORAL_TABLET | Freq: Every day | ORAL | Status: DC
  Administered 2019-02-02: 40 mg via ORAL
  Filled 2019-02-01: qty 1

## 2019-02-01 MED ORDER — SPIRONOLACTONE 25 MG PO TABS
50.0000 mg | ORAL_TABLET | Freq: Every day | ORAL | Status: DC
  Administered 2019-02-01 – 2019-02-02 (×2): 50 mg via ORAL
  Filled 2019-02-01 (×2): qty 2

## 2019-02-01 NOTE — Telephone Encounter (Signed)
Late Entry for 05MAY2020  I, Malena Edman. CRC, placed a phone call to Mr. [name] on [id number] to pre-consent him for the Vitak clinical trial. Approximately 10 minutes was spent going over the protocol procedures and what he could expect with trial participation. After discussion, Grant [name] agreed to participate in the trial. I informed him that someone from the [facility] staff would be coming by his room later this evening. He was thanked for his time and his participation. The call was ended.   Malena Edman.

## 2019-02-01 NOTE — Plan of Care (Signed)
  Problem: Activity: Goal: Ability to tolerate increased activity will improve Outcome: Progressing   Problem: Respiratory: Goal: Ability to maintain adequate ventilation will improve Outcome: Progressing   Problem: Elimination: Goal: Will not experience complications related to bowel motility Outcome: Progressing   Problem: Pain Managment: Goal: General experience of comfort will improve Outcome: Progressing   Problem: Safety: Goal: Ability to remain free from injury will improve Outcome: Progressing

## 2019-02-01 NOTE — Progress Notes (Signed)
PROGRESS NOTE    Grant [name]  DIY:[id number] DOB: 1981-01-24 DOA: 01/21/2019 PCP: Patient, No Pcp Per   Brief Narrative:  38 year old male with history of alcoholic cirrhosis, portal vein thrombosis diagnosed in 06/2018 for which he is currently on Xarelto, severe microcytic anemia, GERD, splenomegaly presented with shortness of breath and cough worsening over the past 3 weeks, with COVID-19 testing done as an outpatient 3 weeks PTA apparently negative, repeat COVID test on 01/21/2019 at our facility was also negative. Admitted on 01/21/2027 right lower lobe pneumonia with loculated pleural fluid, likely empyema Versus Abscess. He was also found to have elevated INR along with hyponatremia. CT surgery was consulted and recommends percutaneous drainage by interventional radiology, s/p Rt pleural fluid drain/chest tube placement on 01/25/2019, chest tube removed 01/29/2019  Assessment & Plan:   Principal Problem:   Acute respiratory failure with hypoxia (HCC) Active Problems:   Alcoholic cirrhosis of liver with ascites (HCC)   Esophageal reflux   Morbid obesity due to excess calories (HCC)   Hypokalemia   Ascites   RLL pneumonia (HCC)   Empyema of right pleural space (HCC)   Hyponatremia   Supratherapeutic INR   Lactic acidosis  1) Rt LL E  Coli pneumonia with loculated pleural fluid/Empyema Versus Abscess. s/p  percutaneous drainage by IR on 01/25/2019 Cefepime 4/25 - 5/1 Ceftriaxone 5/2 - present Will plan for 4 weeks abx for empyema.  Off vancomycin  pleural fluid culture from 01/25/2019 growing pansensitive E. Coli Blood cx NGTD x 5 days Ucx no growth Negative Sars CoV 2 Negative strep pneumo Resp culture with respiratory flora S/p R pigtail chest tube 4/29 which was removed on 01/29/2019  CXR5/5 with stable R effusion and lower lobe opacification CT surgery now signed off, will discuss f/u with them  2)SBP--- paracentesis fluid from 01/23/2019 with 258 PMN's consistent with  Pt  currently on ceftriaxone, s/p 5 days Will need prophylaxis after he completes abx for above  3)Supratherapeutic INR- PTA was on Xarelto for portal vein thrombosis, patient received vitamin K, INR 1.5 on 5.3, continue to follow. Will need to discuss long term plan for anticoagulation -> of note, CT from 4/25 without read of thrombosis of portal vein -> [place] on 06/2018 was notable for complete thrombosis of portal vein - it actually sounds like his anticoagulation was not started until recently  4)Alcoholic liver cirrhosis with ascites--- status post paracentesis on 01/23/2019--- denies recent alcohol use (over 1 year).  Continue folic acid and MVI.   C/n  lactulose 10 g daily. No evidence of encephalopathy at this time, serum ammonia is 57 Lasix and Aldactone were placed on hold due to kidney concerns----- Lasix and spironolactone restarted at lower dose, continue titration as tolerated.   5)FEN/hypokalemia/hypomagnesemia//hyponatremia--- continue to to monitor and replace as indicated  6)AKI--- possibly contrast exposure vs ATN from pneumonia (lower suspicion for HRS), nephrology consult appreciated, ultrasound without significant abnormalities...., Creatinine baseline was 0.6, creatinine is down to 1.3 from 1.85. Lasix and aldactone now restarted. Creatinine peaked at 1.85.  Continue to monitor.  7)Chronic Thrombocytopenia  chronic Anemia-----most likely due to underlying liver disease/cirrhosis, no evidence of ongoing bleeding. Hb 6.9 on 5/5, s/p 1 unit pRBC Hb 7.3 this AM, inappropriate bump Repeat afternoon H/H Denies dark, tarry, bloody stool Continue to monitor  DVT prophylaxis: SCD Code Status: full  Family Communication: none at bedside Disposition Plan: pending further improvement.  Pt on O2 during my eval.  Hb with inappropriate rise.  Will continue IV  abx.  Follow PT eval.  Hopefully d/c within next 24-48 hrs.   Consultants:   Nephrology  CT surgery  IR   Procedures:  4/29 IMPRESSION: CT-guided placement of Rosemae Mcquown 12 French drain within the collection in the posterior right lower chest. Findings are concerning for Nataki Mccrumb lung abscess or empyema.  Chest tube removal on 5/3   Antimicrobials:  Anti-infectives (From admission, onward)   Start     Dose/Rate Route Frequency Ordered Stop   01/27/19 1600  cefTRIAXone (ROCEPHIN) 2 g in sodium chloride 0.9 % 100 mL IVPB     2 g 200 mL/hr over 30 Minutes Intravenous Every 24 hours 01/27/19 1345     01/22/19 1000  ceFEPIme (MAXIPIME) 2 g in sodium chloride 0.9 % 100 mL IVPB  Status:  Discontinued     2 g 200 mL/hr over 30 Minutes Intravenous Every 8 hours 01/22/19 0904 01/27/19 1345   01/22/19 0400  vancomycin (VANCOCIN) IVPB 1000 mg/200 mL premix  Status:  Discontinued     1,000 mg 200 mL/hr over 60 Minutes Intravenous Every 12 hours 01/21/19 1820 01/23/19 0855   01/21/19 1800  vancomycin (VANCOCIN) IVPB 1000 mg/200 mL premix     1,000 mg 200 mL/hr over 60 Minutes Intravenous Every 1 hr x 2 01/21/19 1703 01/21/19 2042   01/21/19 1645  ceFEPIme (MAXIPIME) 1 g in sodium chloride 0.9 % 100 mL IVPB     1 g 200 mL/hr over 30 Minutes Intravenous  Once 01/21/19 1633 01/21/19 1802     Subjective: Feels ok.   Still fatigued, slightly Sob. Just started xarelto prior to admission.  Objective: Vitals:   01/31/19 2327 02/01/19 0432 02/01/19 0726 02/01/19 1000  BP: (!) 99/42  110/65   Pulse: 87  88   Resp:      Temp: 98.5 F (36.9 C)  97.9 F (36.6 C)   TempSrc: Oral  Oral   SpO2: 95%  94% 94%  Weight:  95.8 kg    Height:        Intake/Output Summary (Last 24 hours) at 02/01/2019 1441 Last data filed at 02/01/2019 0800 Gross per 24 hour  Intake 1263.67 ml  Output 450 ml  Net 813.67 ml   Filed Weights   01/30/19 0500 01/31/19 0448 02/01/19 0432  Weight: 92.9 kg 94 kg 95.8 kg    Examination:  General exam: Appears calm and comfortable  Respiratory system: Clear to auscultation. Respiratory  effort normal. Cardiovascular system: S1 & S2 heard, RRR. Gastrointestinal system: Abdomen is distended, soft and nontender.  Central nervous system: Alert and oriented. No focal neurological deficits. Extremities: bilateral LE edema Skin: No rashes, lesions or ulcers Psychiatry: Judgement and insight appear normal. Mood & affect appropriate.     Data Reviewed: I have personally reviewed following labs and imaging studies  CBC: Recent Labs  Lab 01/27/19 0351 01/29/19 0048 01/30/19 0553 01/31/19 0517 02/01/19 0457  WBC 10.7* 11.7* 17.5* 11.8* 8.9  HGB 8.2* 7.7* 7.4* 6.9* 7.3*  HCT 24.5* 23.1* 22.2* 21.3* 22.0*  MCV 101.2* 102.7* 104.7* 104.9* 103.8*  PLT 86* 111* 130* 122* 132*   Basic Metabolic Panel: Recent Labs  Lab 01/27/19 0351 01/29/19 0048 01/30/19 0553 01/31/19 0517 02/01/19 0457  NA 131* 132* 132* 133* 134*  K 4.5 4.7 5.4* 4.5 4.3  CL 102 103 107 107 110  CO2 19* 19* 15* 15* 15*  GLUCOSE 98 117* 98 90 85  BUN 18 22* 30* 36* 29*  CREATININE 1.08 1.29* 1.85* 1.30*  1.17  CALCIUM 9.6 9.2 8.9 8.8* 8.7*   GFR: Estimated Creatinine Clearance: 100.4 mL/min (by C-G formula based on SCr of 1.17 mg/dL). Liver Function Tests: Recent Labs  Lab 01/26/19 0926 01/29/19 0048 01/31/19 0517  AST 61* 70* 65*  ALT 26 26 26   ALKPHOS 106 97 87  BILITOT 8.5* 6.1* 4.8*  PROT 8.6* 8.3* 7.9  ALBUMIN 2.9* 2.6* 2.3*   No results for input(s): LIPASE, AMYLASE in the last 168 hours. Recent Labs  Lab 01/28/19 0454  AMMONIA 57*   Coagulation Profile: Recent Labs  Lab 01/26/19 0424 01/27/19 0351 01/28/19 0454 01/29/19 0048  INR 1.6* 1.6* 1.4* 1.5*   Cardiac Enzymes: No results for input(s): CKTOTAL, CKMB, CKMBINDEX, TROPONINI in the last 168 hours. BNP (last 3 results) No results for input(s): PROBNP in the last 8760 hours. HbA1C: No results for input(s): HGBA1C in the last 72 hours. CBG: No results for input(s): GLUCAP in the last 168 hours. Lipid Profile: No  results for input(s): CHOL, HDL, LDLCALC, TRIG, CHOLHDL, LDLDIRECT in the last 72 hours. Thyroid Function Tests: No results for input(s): TSH, T4TOTAL, FREET4, T3FREE, THYROIDAB in the last 72 hours. Anemia Panel: No results for input(s): VITAMINB12, FOLATE, FERRITIN, TIBC, IRON, RETICCTPCT in the last 72 hours. Sepsis Labs: No results for input(s): PROCALCITON, LATICACIDVEN in the last 168 hours.  Recent Results (from the past 240 hour(s))  Gram stain     Status: None   Collection Time: 01/23/19  3:18 PM  Result Value Ref Range Status   Specimen Description FLUID PERITONEAL  Final   Special Requests NONE  Final   Gram Stain   Final    RARE WBC PRESENT,BOTH PMN AND MONONUCLEAR NO ORGANISMS SEEN Performed at [facility], [address] [place], [place] [address]    Report Status 01/23/2019 FINAL  Final  Culture, body fluid-bottle     Status: None   Collection Time: 01/23/19  3:18 PM  Result Value Ref Range Status   Specimen Description FLUID PERITONEAL  Final   Special Requests BOTTLES DRAWN AEROBIC AND ANAEROBIC 10CC  Final   Culture   Final    NO GROWTH 5 DAYS Performed at [facility], [address] [place], [place] [address]    Report Status 01/28/2019 FINAL  Final  Aerobic/Anaerobic Culture (surgical/deep wound)     Status: None   Collection Time: 01/25/19  3:57 PM  Result Value Ref Range Status   Specimen Description LUNG RIGHT PLEURAL  Final   Special Requests NONE  Final   Gram Stain   Final    ABUNDANT WBC PRESENT,BOTH PMN AND MONONUCLEAR ABUNDANT GRAM NEGATIVE RODS    Culture   Final    MODERATE ESCHERICHIA COLI NO ANAEROBES ISOLATED Performed at [facility], [address] [place], [place] [address]    Report Status 01/30/2019 FINAL  Final   Organism ID, Bacteria ESCHERICHIA COLI  Final      Susceptibility   Escherichia coli - MIC*    AMPICILLIN 4 SENSITIVE Sensitive     CEFAZOLIN <=4 SENSITIVE Sensitive     CEFEPIME <=1 SENSITIVE  Sensitive     CEFTAZIDIME <=1 SENSITIVE Sensitive     CEFTRIAXONE <=1 SENSITIVE Sensitive     CIPROFLOXACIN <=0.25 SENSITIVE Sensitive     GENTAMICIN <=1 SENSITIVE Sensitive     IMIPENEM <=0.25 SENSITIVE Sensitive     TRIMETH/SULFA >=320 RESISTANT Resistant     AMPICILLIN/SULBACTAM <=2 SENSITIVE Sensitive  PIP/TAZO <=4 SENSITIVE Sensitive     Extended ESBL NEGATIVE Sensitive     * MODERATE ESCHERICHIA COLI         Radiology Studies: Dg Chest 2 View  Result Date: 01/31/2019 CLINICAL DATA:  Shortness of breath EXAM: CHEST - 2 VIEW COMPARISON:  Yesterday FINDINGS: Unchanged small or moderate right pleural effusion with opacified underlying lung. Comparatively clear left chest. Stable generous heart size. IMPRESSION: Stable right pleural effusion and lower lobe opacification. Electronically Signed   By: Marnee Spring M.D.   On: 01/31/2019 09:23        Scheduled Meds: . folic acid  1 mg Oral Daily  . furosemide  20 mg Oral Daily  . lactulose  10 g Oral Daily  . magnesium oxide  400 mg Oral Daily  . mouth rinse  15 mL Mouth Rinse BID  . multivitamin with minerals  1 tablet Oral Daily  . pantoprazole  40 mg Oral Daily  . propranolol  10 mg Oral TID  . thiamine  100 mg Oral Daily   Continuous Infusions: . cefTRIAXone (ROCEPHIN)  IV 200 mL/hr at 01/31/19 1625     LOS: 11 days    Time spent: over 30 min    Lacretia Nicks, MD [facility] Pager [email or web address]  If 7PM-7AM, please contact night-coverage [email or web address] Password [email or web address] 02/01/2019, 2:41 PM

## 2019-02-01 NOTE — Research (Signed)
VITAK- 19 Antibody Fast Detection Kit Study  Informed Consent  Subject Name: Grant [name]  This Patient [name], has been consented to the above reference Clinical Trial in compliance with FDA regulations, GCP Guidelines and [facility] requirements. The informed consent and study related procedures have been explained to the subject by Study Coordinator prior to obtaining consent. The subject expressed comprehension and requirements of the study and subject involved activities. No study procedures were conducted prior to obtained consent. The patient was given ample time to review the consent form. The subject was informed that this study is for the purpose of research and that participation was completely voluntary. All questions were answered to the satisfaction of the subject. The subject voluntarily signed consent version 1.0 at 10:20am on [id number]. A copy of the signed consent was given to the patient and a copy was placed in the subject's medical record. The subject was thanked for their participation and contribution to science and research.   Sub-I Dr. Baltazar Apo, participated in the consent process and signed the consent form.  Due to the nature of this study being of minimal risk, the [facility] Informed Consent Document Checklist and Coordinator Visit Worksheet will not be utilized for the inpatient arm of this trial.  Please refer to the subject paper source binder for further information regarding todays' visit.   Rosaland Lao BS, [place], [place] [id number] 9:57 PM

## 2019-02-01 NOTE — Progress Notes (Signed)
      [address]       Grant [name] [address]             [phone]      Subjective:  Feeling better, no specific complaints.  Asking when he thinks he can go home.  Objective: Vital signs in last 24 hours: Temp:  [97.7 F (36.5 C)-98.5 F (36.9 C)] 97.9 F (36.6 C) (05/06 0726) Pulse Rate:  [83-94] 88 (05/06 0726) Cardiac Rhythm: Normal sinus rhythm (05/06 0701) Resp:  [16-20] 16 (05/05 1038) BP: (99-115)/(39-67) 110/65 (05/06 0726) SpO2:  [94 %-96 %] 94 % (05/06 0726) Weight:  [95.8 kg] 95.8 kg (05/06 0432)  Intake/Output from previous day: 05/05 0701 - 05/06 0700 In: 1245.7 [P.O.:594; I.V.:10; Blood:330; IV Piggyback:311.7] Out: 600 [Urine:600]  General appearance: alert, cooperative and no distress Heart: regular rate and rhythm Lungs: diminished breath sounds right base  Lab Results: Recent Labs    01/31/19 0517 02/01/19 0457  WBC 11.8* 8.9  HGB 6.9* 7.3*  HCT 21.3* 22.0*  PLT 122* 132*   BMET:  Recent Labs    01/31/19 0517 02/01/19 0457  NA 133* 134*  K 4.5 4.3  CL 107 110  CO2 15* 15*  GLUCOSE 90 85  BUN 36* 29*  CREATININE 1.30* 1.17  CALCIUM 8.8* 8.7*    PT/INR: No results for input(s): LABPROT, INR in the last 72 hours. ABG No results found for: PHART, HCO3, TCO2, ACIDBASEDEF, O2SAT CBG (last 3)  No results for input(s): GLUCAP in the last 72 hours.  Assessment/Plan:  1. ID- patient is afebrile, leukocytosis is back to WNL, ABX per primary 2. Pulm- no acute issues, off oxygen, CXR shows improvement of right pleural effusion 3. dispo- patient stable, care per primary, please re-consult if we can be of further assistance   LOS: 11 days    Grant [name] 02/01/2019

## 2019-02-02 LAB — CBC
HCT: 22.2 % — ABNORMAL LOW (ref 39.0–52.0)
Hemoglobin: 7.1 g/dL — ABNORMAL LOW (ref 13.0–17.0)
MCH: 33.6 pg (ref 26.0–34.0)
MCHC: 32 g/dL (ref 30.0–36.0)
MCV: 105.2 fL — ABNORMAL HIGH (ref 80.0–100.0)
Platelets: 133 10*3/uL — ABNORMAL LOW (ref 150–400)
RBC: 2.11 MIL/uL — ABNORMAL LOW (ref 4.22–5.81)
RDW: 18.8 % — ABNORMAL HIGH (ref 11.5–15.5)
WBC: 9.8 10*3/uL (ref 4.0–10.5)
nRBC: 0 % (ref 0.0–0.2)

## 2019-02-02 LAB — MAGNESIUM: Magnesium: 1.6 mg/dL — ABNORMAL LOW (ref 1.7–2.4)

## 2019-02-02 LAB — PROTIME-INR
INR: 1.4 — ABNORMAL HIGH (ref 0.8–1.2)
Prothrombin Time: 17.3 seconds — ABNORMAL HIGH (ref 11.4–15.2)

## 2019-02-02 LAB — HEMOGLOBIN AND HEMATOCRIT, BLOOD
HCT: 24.1 % — ABNORMAL LOW (ref 39.0–52.0)
Hemoglobin: 8 g/dL — ABNORMAL LOW (ref 13.0–17.0)

## 2019-02-02 MED ORDER — AMOXICILLIN-POT CLAVULANATE 875-125 MG PO TABS: 1.0000 | ORAL_TABLET | Freq: Two times a day (BID) | ORAL | 0 refills | Status: AC

## 2019-02-02 MED ORDER — LACTULOSE 10 GM/15ML PO SOLN: 10.0000 g | Freq: Every day | ORAL | 0 refills | Status: AC

## 2019-02-02 MED ORDER — SPIRONOLACTONE 50 MG PO TABS: 50.0000 mg | ORAL_TABLET | Freq: Every day | ORAL | 0 refills | Status: AC

## 2019-02-02 MED ORDER — FUROSEMIDE 40 MG PO TABS: 40.0000 mg | ORAL_TABLET | Freq: Every day | ORAL | 0 refills | Status: AC

## 2019-02-02 MED ORDER — AMOXICILLIN-POT CLAVULANATE 875-125 MG PO TABS: 1.0000 | ORAL_TABLET | Freq: Two times a day (BID) | ORAL | Status: DC

## 2019-02-02 MED ORDER — PROPRANOLOL HCL 10 MG PO TABS: 10.0000 mg | ORAL_TABLET | Freq: Three times a day (TID) | ORAL | 0 refills | Status: AC

## 2019-02-02 MED ORDER — MAGNESIUM SULFATE 2 GM/50ML IV SOLN
2.0000 g | Freq: Once | INTRAVENOUS | Status: DC
  Filled 2019-02-02: qty 50

## 2019-02-02 NOTE — Discharge Summary (Signed)
Physician Discharge Summary  Trevarius Taul ZOX:[id number] DOB: [birth year] DOA: 01/21/2019  PCP: Patient, No Pcp Per  Admit date: 01/21/2019 Discharge date: 02/02/2019  Time spent: 40 minutes  Recommendations for Outpatient Follow-up:  1. Follow up outpatient CBC/CMP 2. Ensure completion of course of antibiotics, d/c with 3 weeks of augmentin 3. Follow up CXR as outpatient  4. Pt with SBP here, follow up need for SBP prophylaxis after he completes course of augmentin 5. Lasix/spironolactone doses decreased here with AKI.  Please follow volume status and uptitrate dose as tolerated 6. Xarelto discontinued at discharge as recent CT imaging without portal vein thrombosis.  Follow up with outpatient provider. 7. Follow up outpatient PT 8. Follow up avascular necrosis outpatient    Discharge Diagnoses:  Principal Problem:   Acute respiratory failure with hypoxia (HCC) Active Problems:   Alcoholic cirrhosis of liver with ascites (HCC)   Esophageal reflux   Morbid obesity due to excess calories (HCC)   Hypokalemia   Ascites   RLL pneumonia (HCC)   Empyema of right pleural space (HCC)   Hyponatremia   Supratherapeutic INR   Lactic acidosis   Discharge Condition: stable  Diet recommendation: low sodium  Filed Weights   01/31/19 0448 02/01/19 0432 02/02/19 0615  Weight: 94 kg 95.8 kg 94.6 kg    History of present illness:  38 year old male with history of alcoholic cirrhosis, portal vein thrombosis diagnosed in 06/2018 for which he is currently on Xarelto, severe microcytic anemia, GERD, splenomegaly presented with shortness of breath and cough worsening over the past 3 weeks, with COVID-19 testing done as an outpatient 3 weeks PTA apparently negative, repeat COVID test on 01/21/2019 at our facility was also negative. Admitted on 01/21/2027 right lower lobe pneumonia with loculated pleural fluid, likely empyema Versus Abscess. He was also found to have elevated INR along with hyponatremia.  CT surgery was consulted and recommends percutaneous drainage by interventional radiology, s/p Rt pleural fluid drain/chest tube placement on 01/25/2019, chest tube removed 01/29/2019  He was admitted for right lower lobe pneumonia with loculated pleural fluid.  S/p chest tube placement and now removal.  Pleural fluid growing e. Coli.  Pt discharged on augmentin.  Also developed SBP and was treated with ceftriaxone.    See below for additional details  Hospital Course:  1) Rt LL E Coli pneumonia with loculated pleural fluid/Empyema Versus Abscess. s/p percutaneous drainage by IR on 01/25/2019 Cefepime 4/25 - 5/1 Ceftriaxone 5/2 - 5/6 Discharged on augmentin for additional 3 weeks Off vancomycin  pleural fluid culture from 01/25/2019 growing pansensitive E. Coli Blood cx NGTD x 5 days Ucx no growth Negative Sars CoV 2 Negative strep pneumo Resp culture with respiratory flora S/p R pigtail chest tube 4/29 which was removed on 01/29/2019  CXR 5/5 with stable R effusion and lower lobe opacification CT surgery now signed off, recommending outpatient follow up with medicine (no need for surgical follow up)  2)SBP--- paracentesis fluid from 01/23/2019 with 258 PMN's consistent with  Pt currently on ceftriaxone, s/p 5 days Will need prophylaxis after he completes abx for above  3)Supratherapeutic INR- PTA was on Xarelto for portal vein thrombosis,patient received vitamin K, INR 1.5 on 5.3, continue to follow. Will need to discuss long term plan for anticoagulation -> of note, CT from 4/25 without read of thrombosis of portal vein -> [place] on 06/2018 was notable for complete thrombosis of portal vein As most recent imaging without evidence of portal vein thrombosis and pt didn't start anticoagulation  until recently, will d/c xarelto and have him follow up with PCP/GI regarding this  4)Alcoholic liver cirrhosis with ascites--- status post paracentesis on 01/23/2019--- denies recent alcohol use (over  1 year).  Continue folic acid and MVI.  C/n lactulose 10 g daily. No evidence of encephalopathy at this time, serum ammonia is 57 Lasix and Aldactone were placed on holddue to kidney concerns----- Lasix and spironolactone restarted at lower dose, continue titration as tolerated as an outpatient  5)FEN/hypokalemia/hypomagnesemia//hyponatremia--- continue toto monitor andreplace as indicated  6)AKI--- possibly contrast exposure vs ATN from pneumonia (lower suspicion for HRS), nephrology consult appreciated, ultrasound without significant abnormalities...., Creatinine baseline was 0.6, creatinine improved at discharge Lasix and aldactone now restarted. Creatinine peaked at 1.85.  Continue to monitor.  7)Chronic Thrombocytopenia  chronic Anemia-----most likely due to underlying liver disease/cirrhosis, no evidence of ongoing bleeding. Hb 6.9 on 5/5, s/p 1 unit pRBC Hb stable today at discharge Denies dark, tarry, bloody stool Continue to monitor  Procedures: 4/29 IMPRESSION: CT-guided placement of Gussie Towson 12 French drain within the collection in the posterior right lower chest. Findings are concerning for Kalvin Buss lung abscess or empyema.  Chest tube removal on 5/3  Consultations:  Nephrology  CT surgery  IR  Discharge Exam: Vitals:   02/01/19 2241 02/02/19 0734  BP: (!) 110/55 110/62  Pulse: 96 91  Resp: 20   Temp: 98.4 F (36.9 C) 98.8 F (37.1 C)  SpO2: 91% 93%   Feels ready for discharge. Discussed discharge plan.  General: No acute distress. Cardiovascular: Heart sounds show Cedrica Brune regular rate, and rhythm. Lungs: Clear to auscultation bilaterally Abdomen: Soft, nontender, distended. Neurological: Alert and oriented 3. Moves all extremities 4 with equal strength. Cranial nerves II through XII grossly intact. Skin: Warm and dry. No rashes or lesions. Extremities: No clubbing or cyanosis. No edema. Ted hose. Psychiatric: Mood and affect are normal. Insight and  judgment are appropriate.   Discharge Instructions   Discharge Instructions    Call MD for:  difficulty breathing, headache or visual disturbances   Complete by:  As directed    Call MD for:  extreme fatigue   Complete by:  As directed    Call MD for:  hives   Complete by:  As directed    Call MD for:  persistant dizziness or light-headedness   Complete by:  As directed    Call MD for:  persistant nausea and vomiting   Complete by:  As directed    Call MD for:  redness, tenderness, or signs of infection (pain, swelling, redness, odor or green/yellow discharge around incision site)   Complete by:  As directed    Call MD for:  severe uncontrolled pain   Complete by:  As directed    Call MD for:  temperature >100.4   Complete by:  As directed    Diet - low sodium heart healthy   Complete by:  As directed    Discharge instructions   Complete by:  As directed    You were seen for an empyema that required drainage with Carlena Ruybal chest tube.  This grew e. Coli.  We have placed you on antibiotics.  Continue augmentin for the next 3 weeks.  Please follow up Levie Owensby repeat chest x ray with your PCP in [place] few weeks.  You also had SBP (an infection in your ascites fluid).  You were treated with antibiotics for this.  You'll need to discuss long term plan for prevention for this after you finish  your antibiotics.  You were on xarelto for Eulogio Requena portal vein thrombosis.  This was not present on the CT scan from prior to this admission.  Stop your xarelto at this time.  Discuss further anticoagulation with your PCP.  Your diuretics (lasix and spironolactone) were decreased.  Please follow up with your PCP or GI doctor as an outpatient and uptitrate the dose as tolerated.    Return for new, recurrent, or worsening symptoms.  Please ask your PCP to request records from this hospitalization so they know what was done and what the next steps will be.   Increase activity slowly   Complete by:  As directed       Allergies as of 02/02/2019   No Known Allergies     Medication List    STOP taking these medications   Xarelto 20 MG Tabs tablet Generic drug:  rivaroxaban     TAKE these medications   amoxicillin-clavulanate 875-125 MG tablet Commonly known as:  AUGMENTIN Take 1 tablet by mouth every 12 (twelve) hours for 21 days.   folic acid 1 MG tablet Commonly known as:  FOLVITE Take 1 tablet (1 mg total) by mouth daily.   furosemide 40 MG tablet Commonly known as:  LASIX Take 1 tablet (40 mg total) by mouth daily for 30 days. What changed:  when to take this   lactulose 10 GM/15ML solution Commonly known as:  CHRONULAC Take 15 mLs (10 g total) by mouth daily. Start taking on:  Feb 03, 2019   Magnesium 250 MG Tabs Take 250 mg by mouth daily.   multivitamin tablet Take 1 tablet by mouth daily.   omeprazole 20 MG capsule Commonly known as:  PRILOSEC TAKE ONE CAPSULE BY MOUTH TWICE DAILY BEFORE Krishav Mamone MEAL What changed:    how much to take  how to take this  when to take this  additional instructions   propranolol 10 MG tablet Commonly known as:  INDERAL Take 1 tablet (10 mg total) by mouth 3 (three) times daily for 30 days. What changed:    medication strength  how much to take  when to take this   spironolactone 50 MG tablet Commonly known as:  ALDACTONE Take 1 tablet (50 mg total) by mouth daily for 30 days. Start taking on:  Feb 03, 2019 What changed:    medication strength  how much to take  when to take this   thiamine 100 MG tablet Take 1 tablet (100 mg total) by mouth daily.      No Known Allergies Follow-up Information    Jolee Ewing, NP. Call.   Specialty:  Nurse Practitioner Why:  Primary Care Provider.  Call to schedule Nazyia Gaugh hospital follow up appointment.  Contact information: [facility] [place] [address] [phone]            The results of significant diagnostics from this hospitalization (including imaging,  microbiology, ancillary and laboratory) are listed below for reference.    Significant Diagnostic Studies: Dg Chest 2 View  Result Date: 01/31/2019 CLINICAL DATA:  Shortness of breath EXAM: CHEST - 2 VIEW COMPARISON:  Yesterday FINDINGS: Unchanged small or moderate right pleural effusion with opacified underlying lung. Comparatively clear left chest. Stable generous heart size. IMPRESSION: Stable right pleural effusion and lower lobe opacification. Electronically Signed   By: Marnee Spring M.D.   On: 01/31/2019 09:23   Dg Chest 2 View  Result Date: 01/30/2019 CLINICAL DATA:  Chest tube removal. History of right lower  lobe pneumonia and empyema. EXAM: CHEST - 2 VIEW COMPARISON:  Chest x-ray from yesterday. FINDINGS: Stable cardiomediastinal silhouette. Normal pulmonary vascularity. Persistent moderate loculated right pleural effusion and right lower lobe consolidation. Minimal atelectasis at the left lung base. No pneumothorax. No acute osseous abnormality. IMPRESSION: 1. Persistent moderate loculated right pleural effusion and right lower lobe pneumonia. Electronically Signed   By: Obie Dredge M.D.   On: 01/30/2019 08:15   Ct Chest Wo Contrast  Result Date: 01/21/2019 CLINICAL DATA:  Right lower lobe opacity and complex pleural fluid seen on prior abdominal CT. EXAM: CT CHEST WITHOUT CONTRAST TECHNIQUE: Multidetector CT imaging of the chest was performed following the standard protocol without IV contrast. COMPARISON:  CT abdomen 01/21/2019 FINDINGS: Cardiovascular: Heart is normal size. Aorta is normal caliber. Coronary artery calcification in the left anterior descending coronary artery. Mediastinum/Nodes: No mediastinal, hilar, or axillary adenopathy. Lungs/Pleura: Dense consolidation noted in the right lower lobe compatible with pneumonia. Loculated pleural fluid noted along the right heart border and superior laterally in the right hemithorax. There is Maddux First rounded fluid collection posteriorly in  the right lower hemithorax measuring 7.2 cm concerning for empyema or pulmonary abscess. On coronal imaging, this extends over Mikenzie Mccannon craniocaudal length of 10 cm. Dependent atelectasis in the left lower lobe. Upper Abdomen: Changes of cirrhosis with ascites. Musculoskeletal: No acute bony abnormality. Chest wall soft tissues are unremarkable. IMPRESSION: Dense consolidation in the right lower lobe compatible with pneumonia. Loculated right pleural fluid. Rounded fluid collection posteriorly in the right lower hemithorax which could be loculated fluid and reflect empyema or pulmonary abscess. Coronary artery disease. Cirrhosis, ascites. Electronically Signed   By: Charlett Nose M.D.   On: 01/21/2019 20:28   Ct Abdomen Pelvis W Contrast  Result Date: 01/21/2019 CLINICAL DATA:  Coughing since January. History of thrombocytopenia, splenomegaly and cirrhosis. Recently diagnosed with pneumonia. EXAM: CT ABDOMEN AND PELVIS WITH CONTRAST TECHNIQUE: Multidetector CT imaging of the abdomen and pelvis was performed using the standard protocol following bolus administration of intravenous contrast. CONTRAST:  OMNIPAQUE IOHEXOL 300 MG/ML  SOLN COMPARISON:  Chest radiographs today. Abdominopelvic CT 05/08/2016. Right upper quadrant ultrasound 06/30/2018. FINDINGS: Lower chest: The lung bases are incompletely visualized. There is consolidation of the visualized right lower lobe with Rhea Kaelin large loculated right pleural effusion. This has Jacquel Mccamish component posteriorly with thick peripheral enhancement, measuring up to 7.9 x 5.2 cm on image 9/2. Based on the provided history, this is suspicious for empyema and possible pleural-based abscess. There is minimal atelectasis at the left lung base and no significant left pleural effusion. The azygos and hemi azygous veins are mildly dilated. Hepatobiliary: There are progressive changes of cirrhosis throughout the liver with diffuse contour irregularity and parenchymal heterogeneity. No dominant  mass or focally abnormal enhancement identified. The portal vein is patent. No evidence of gallstones, gallbladder wall thickening or biliary dilatation. Pancreas: Unremarkable. No pancreatic ductal dilatation or surrounding inflammatory changes. Spleen: Measures approximately 14.7 x 7.5 x 12.3 cm (volume = 710 cm^3) consistent with mild splenomegaly. No focal abnormality. Adrenals/Urinary Tract: Both adrenal glands appear normal. The kidneys, ureters and bladder appear normal. Stomach/Bowel: No evidence of bowel wall thickening, distention or surrounding inflammatory change. There is Jessicah Croll large amount of stool within the rectum. There is Luellen Howson probable loop of small bowel extending into an umbilical hernia. No evidence of bowel obstruction or incarceration. Vascular/Lymphatic: There are no enlarged abdominal or pelvic lymph nodes. Minimal aortic atherosclerosis. No acute vascular findings are identified. There is  Avaeh Ewer splenorenal shunt and multiple mesenteric venous collaterals. The portal, superior mesenteric and splenic veins appear patent. Reproductive: The prostate gland and seminal vesicles appear normal. Other: Interval development of Momo Braun moderate amount of ascites. Fluid extends into an umbilical hernia which measures up to 8.6 cm transverse on image 73/2. As above, there is air within this hernia as well which appears to be due to Adaiah Morken small knuckle of small bowel. Musculoskeletal: No acute osseous findings. There are old rib fractures on the left. There are changes of bilateral femoral head avascular necrosis with early subchondral collapse on the right. There is edema throughout the subcutaneous fat. In the upper right buttocks, there is Charl Wellen globular area of increased signal which could be related to prior trauma/injection or reflect fat necrosis. IMPRESSION: 1. Right lower lobe pulmonary consolidation with complex fluid in the right pleural space, worrisome for empyema and pleural-based abscess. Full chest CT may be  helpful for further evaluation. 2. Significantly progressive changes of diffuse hepatic cirrhosis compared with previous CT from 2017. Mild changes of portal hypertension with splenomegaly and venous collaterals. The main portal vein appears patent. 3. Progressive moderate volume ascites. Ascites and Iyona Pehrson small bowel loop extend into an umbilical hernia. No evidence of bowel obstruction or incarceration. 4. Chronic bilateral femoral head avascular necrosis. Electronically Signed   By: Carey Bullocks M.D.   On: 01/21/2019 19:37   US Renal  Result Date: 01/23/2019 CLINICAL DATA:  Renal failure EXAM: RENAL / URINARY TRACT ULTRASOUND COMPLETE COMPARISON:  None. FINDINGS: Right Kidney: Renal measurements: 12.4 x 6.8 x 6.4 cm = volume: 286 mL . Echogenicity within normal limits. No mass or hydronephrosis visualized. Left Kidney: Renal measurements: 11.8 x 6 x 6.5 cm = volume: 239 mL. Echogenicity within normal limits. No mass or hydronephrosis visualized. Bladder: Appears normal for degree of bladder distention. Other: Increased hepatic echogenicity with Natthew Marlatt slightly nodular contour as can be seen with hepatic cirrhosis. Small amount of perihepatic ascites. Splenomegaly with the spleen measuring 18.5 cm in length and Lillan Mccreadie volume of 1535 mL. IMPRESSION: 1. Normal renal ultrasound. 2. Increased hepatic echogenicity with Mattison Stuckey slightly nodular contour as can be seen with hepatic cirrhosis. Small volume perihepatic ascites. Splenomegaly. Correlate with liver function test. Electronically Signed   By: Elige Ko   On: 01/23/2019 10:06   Dg Chest Port 1 View  Result Date: 01/29/2019 CLINICAL DATA:  Right chest tube removal EXAM: PORTABLE CHEST 1 VIEW COMPARISON:  Chest radiograph from one day prior. FINDINGS: Low lung volumes. Interval removal of basilar right chest tube. Stable cardiomediastinal silhouette with top-normal heart size. No pneumothorax. No left pleural effusion. Stable blunting of the right costophrenic angle. No  overt pulmonary edema. Patchy right lung base opacity is unchanged. IMPRESSION: 1. No pneumothorax status post right chest tube removal. 2. Low lung volumes with stable patchy right lung base opacity and blunting of the right costophrenic angle, which could represent residual small pleural effusions/pleural thickening. Continued chest radiograph follow-up buys. Electronically Signed   By: Delbert Phenix M.D.   On: 01/29/2019 14:11   Dg Chest Port 1 View  Result Date: 01/28/2019 CLINICAL DATA:  Empyema of pleural space EXAM: PORTABLE CHEST 1 VIEW COMPARISON:  Yesterday FINDINGS: Pigtail catheter over the right base is unchanged. Unchanged pleural fluid and parenchymal opacity. Comparatively clear left chest. Normal heart size. IMPRESSION: Unchanged pleuroparenchymal opacity at the right base. Electronically Signed   By: Marnee Spring M.D.   On: 01/28/2019 09:24   Dg Chest  Port 1 View  Result Date: 01/27/2019 CLINICAL DATA:  Empyema. EXAM: PORTABLE CHEST 1 VIEW COMPARISON:  Radiograph of January 26, 2019. FINDINGS: The heart size and mediastinal contours are within normal limits. Left lung is clear. Stable position of right-sided pleural drainage catheter is noted. Right pleural effusion is unchanged. There is associated atelectasis in right lung base. No pneumothorax is noted. The visualized skeletal structures are unremarkable. IMPRESSION: Stable position of right-sided pleural drainage catheter with stable right pleural effusion and associated atelectasis. Electronically Signed   By: Lupita Raider M.D.   On: 01/27/2019 07:13   Dg Chest Port 1 View  Result Date: 01/26/2019 CLINICAL DATA:  Chest tube EXAM: PORTABLE CHEST 1 VIEW COMPARISON:  01/21/2019 FINDINGS: Right basilar chest tube is now in place. Right pleural effusion and basilar pulmonary opacity improved. Increasing small left pleural effusion. Upper lungs clear. Cardiomegaly. No pneumothorax. IMPRESSION: Improved right pleural effusion after chest  tube placement. New small left pleural effusion. Electronically Signed   By: Jolaine Click M.D.   On: 01/26/2019 08:00   Dg Chest Portable 1 View  Result Date: 01/21/2019 CLINICAL DATA:  38 year old with current history of hepatic cirrhosis and, by the patient's report, recent diagnosis of pneumonia at an outside facility. EXAM: PORTABLE CHEST 1 VIEW COMPARISON:  02/21/2018 and earlier, including CTA chest 05/13/2015. FINDINGS: Cardiac silhouette moderately to markedly enlarged, unchanged since 2019 but increased in size since 2016. Pulmonary venous hypertension with perhaps minimal interstitial pulmonary edema. Large RIGHT pleural effusion and associated consolidation in the RIGHT LOWER LOBE. Lungs otherwise clear. No visible LEFT pleural effusion. IMPRESSION: 1. Large RIGHT pleural effusion and associated passive atelectasis and/or pneumonia involving the RIGHT LOWER LOBE. 2. Stable moderate to marked cardiomegaly. Pulmonary venous hypertension with perhaps minimal interstitial pulmonary edema indicating CHF and/or fluid overload. Electronically Signed   By: Hulan Saas M.D.   On: 01/21/2019 16:25   Ct Image Guided Fluid Drain By Catheter  Result Date: 01/25/2019 INDICATION: 38 year old with right lung pneumonia, loculated right pleural fluid and Neev Mcmains focal collection in the posterior lower chest. Findings are concerning for an empyema or lung abscess. Plan for percutaneous drainage of this posterior right chest fluid collection. EXAM: CT-GUIDED PLACEMENT OF RIGHT CHEST TUBE MEDICATIONS: No antibiotics were given for this procedure. ANESTHESIA/SEDATION: Fentanyl 50 mcg IV; Versed 1.5 mg IV Moderate Sedation Time:  41 minutes The patient was continuously monitored during the procedure by the interventional radiology nurse under my direct supervision. COMPLICATIONS: None immediate. PROCEDURE: Informed written consent was obtained from the patient after Jenavie Stanczak thorough discussion of the procedural risks, benefits  and alternatives. All questions were addressed. Maximal Sterile Barrier Technique was utilized including caps, mask, sterile gowns, sterile gloves, sterile drape, hand hygiene and skin antiseptic. Kaius Daino timeout was performed prior to the initiation of the procedure. Patient was placed prone. CT images through the lower chest were obtained. The right side of the chest was prepped and draped in sterile fashion. Skin and soft tissues anesthetized with 1% lidocaine. Using CT guidance, Amberrose Friebel Yueh catheter was directed into the right posterior lower chest fluid collection. Initially, thick yellow fluid was aspirated. Stiff Amplatz wire was advanced into the collection and the patient started to cough and became symptomatic. The wire was removed for the Yueh catheter again. Additional bloody fluid was removed from the chest collection. Eventually, the patient's coughing stopped. Subsequently, Yueh catheter was removed over Alani Sabbagh stiff Amplatz wire and the percutaneous tract was dilated to accommodate Tierrah Anastos 12 [place]  drain. Approximately 100 mL of thick bloody purulent fluid was removed from the chest collection. The tube was starting to clot and the tube was irrigated with normal saline. The tube was attached to Estreya Clay PleurEvac and wall suction. Additional bloody fluid was removed. Catheter was sutured to skin and Mariavictoria Nottingham bandage was placed. FINDINGS: Again noted is Maricsa Sammons complex fluid collection in the posteromedial right lower chest adjacent to right lung consolidation. Catheter was directed into the fluid collection and thick bloody purulent fluid was removed. 12 French drain was placed. IMPRESSION: CT-guided placement of Yazmin Locher 12 French drain within the collection in the posterior right lower chest. Findings are concerning for Ladona Rosten lung abscess or empyema. Electronically Signed   By: Richarda Overlie M.D.   On: 01/25/2019 17:42   Ir Paracentesis  Result Date: 01/23/2019 INDICATION: Patient with history of alcoholic cirrhosis with recurrent ascites. Request  is made for diagnostic and therapeutic paracentesis. EXAM: ULTRASOUND GUIDED DIAGNOSTIC AND THERAPEUTIC PARACENTESIS MEDICATIONS: 10 mL 1% lidocaine COMPLICATIONS: None immediate. PROCEDURE: Informed written consent was obtained from the patient after Mikail Goostree discussion of the risks, benefits and alternatives to treatment. Khloe Hunkele timeout was performed prior to the initiation of the procedure. Initial ultrasound scanning demonstrates Nykia Turko moderate amount of ascites within the right lower abdominal quadrant. The right lower abdomen was prepped and draped in the usual sterile fashion. 1% lidocaine was used for local anesthesia. Following this, Jancarlos Thrun 19 gauge, 7-cm, Yueh catheter was introduced. An ultrasound image was saved for documentation purposes. The paracentesis was performed. The catheter was removed and Toussaint Golson dressing was applied. The patient tolerated the procedure well without immediate post procedural complication. FINDINGS: Dazja Houchin total of approximately 2.2 L of clear yellow fluid was removed. Samples were sent to the laboratory as requested by the clinical team. IMPRESSION: Successful ultrasound-guided paracentesis yielding 2.2 L of peritoneal fluid. Read by: Elwin Mocha, PA-C Electronically Signed   By: Richarda Overlie M.D.   On: 01/23/2019 15:46    Microbiology: Recent Results (from the past 240 hour(s))  Aerobic/Anaerobic Culture (surgical/deep wound)     Status: None   Collection Time: 01/25/19  3:57 PM  Result Value Ref Range Status   Specimen Description LUNG RIGHT PLEURAL  Final   Special Requests NONE  Final   Gram Stain   Final    ABUNDANT WBC PRESENT,BOTH PMN AND MONONUCLEAR ABUNDANT GRAM NEGATIVE RODS    Culture   Final    MODERATE ESCHERICHIA COLI NO ANAEROBES ISOLATED Performed at [facility], [address] [place], [place] [address]    Report Status 01/30/2019 FINAL  Final   Organism ID, Bacteria ESCHERICHIA COLI  Final      Susceptibility   Escherichia coli - MIC*    AMPICILLIN 4  SENSITIVE Sensitive     CEFAZOLIN <=4 SENSITIVE Sensitive     CEFEPIME <=1 SENSITIVE Sensitive     CEFTAZIDIME <=1 SENSITIVE Sensitive     CEFTRIAXONE <=1 SENSITIVE Sensitive     CIPROFLOXACIN <=0.25 SENSITIVE Sensitive     GENTAMICIN <=1 SENSITIVE Sensitive     IMIPENEM <=0.25 SENSITIVE Sensitive     TRIMETH/SULFA >=320 RESISTANT Resistant     AMPICILLIN/SULBACTAM <=2 SENSITIVE Sensitive     PIP/TAZO <=4 SENSITIVE Sensitive     Extended ESBL NEGATIVE Sensitive     * MODERATE ESCHERICHIA COLI     Labs: Basic Metabolic Panel: Recent Labs  Lab 01/29/19 0048 01/30/19 0553 01/31/19 0517 02/01/19 0457 02/01/19 1609 02/02/19 0239  NA 132* 132* 133*  134* 134*  --   K 4.7 5.4* 4.5 4.3 4.6  --   CL 103 107 107 110 107  --   CO2 19* 15* 15* 15* 16*  --   GLUCOSE 117* 98 90 85 91  --   BUN 22* 30* 36* 29* 23*  --   CREATININE 1.29* 1.85* 1.30* 1.17 0.95  --   CALCIUM 9.2 8.9 8.8* 8.7* 8.9  --   MG  --   --   --   --   --  1.6*   Liver Function Tests: Recent Labs  Lab 01/29/19 0048 01/31/19 0517 02/01/19 1609  AST 70* 65* 85*  ALT 26 26 30   ALKPHOS 97 87 96  BILITOT 6.1* 4.8* 3.8*  PROT 8.3* 7.9 7.7  ALBUMIN 2.6* 2.3* 2.2*   No results for input(s): LIPASE, AMYLASE in the last 168 hours. Recent Labs  Lab 01/28/19 0454  AMMONIA 57*   CBC: Recent Labs  Lab 01/29/19 0048 01/30/19 0553 01/31/19 0517 02/01/19 0457 02/01/19 1447 02/02/19 0239 02/02/19 1136  WBC 11.7* 17.5* 11.8* 8.9  --  9.8  --   HGB 7.7* 7.4* 6.9* 7.3* 8.0* 7.1* 8.0*  HCT 23.1* 22.2* 21.3* 22.0* 24.2* 22.2* 24.1*  MCV 102.7* 104.7* 104.9* 103.8*  --  105.2*  --   PLT 111* 130* 122* 132*  --  133*  --    Cardiac Enzymes: No results for input(s): CKTOTAL, CKMB, CKMBINDEX, TROPONINI in the last 168 hours. BNP: BNP (last 3 results) Recent Labs    01/21/19 1648  BNP 318.0*    ProBNP (last 3 results) No results for input(s): PROBNP in the last 8760 hours.  CBG: No results for input(s):  GLUCAP in the last 168 hours.     Signed:  Lacretia Nicksaldwell Powell MD.  [facility] 02/02/2019, 6:54 PM

## 2019-02-02 NOTE — Evaluation (Signed)
Physical Therapy Evaluation Patient Details Name: Grant [name] MRN: [id number] DOB: Apr 15, 1981 Today's Date: 02/02/2019   History of Present Illness  38 year old male with history of alcoholic cirrhosis, portal vein thrombosis diagnosed in 06/2018 for which he is currently on Xarelto, severe microcytic anemia, GERD, splenomegaly presented with shortness of breath and cough worsening over the past 3 weeks, with COVID-19 testing done as an outpatient 3 weeks PTA apparently negative, repeat COVID test on 01/21/2019 at our facility was also negative. Admitted on 01/21/2027 right lower lobe pneumonia with loculated pleural fluid, likely empyema Versus Abscess. He was also found to have elevated INR along with hyponatremia. CT surgery was consulted and recommends percutaneous drainage by interventional radiology, s/p Rt pleural fluid drain/chest tube placement on 01/25/2019, chest tube removed 01/29/2019    Clinical Impression  Patient evaluated by Physical Therapy with no further acute PT needs identified. All education has been completed and the patient has no further questions. PTA pt independent living at home with his father. Today ambulating unit without assistance, SpO2 WNL, DOE 0/4. Pt has chronic hip weakness and mild balance deficits rec OP PT for higher level care.  for  See below for any follow-up Physical Therapy or equipment needs. PT is signing off. Thank you for this referral.     Follow Up Recommendations Outpatient PT    Equipment Recommendations  None recommended by PT    Recommendations for Other Services       Precautions / Restrictions Restrictions Weight Bearing Restrictions: No      Mobility  Bed Mobility Overal bed mobility: Independent                Transfers Overall transfer level: Independent                  Ambulation/Gait Ambulation/Gait assistance: Independent Gait Distance (Feet): 200 Feet Assistive device: None Gait Pattern/deviations:  WFL(Within Functional Limits) Gait velocity: decreased   General Gait Details: gait with short step length, he reports fatigue in Hip due to prior dx of necrosis. SpO2 WNl on RA  Stairs            Wheelchair Mobility    Modified Rankin (Stroke Patients Only)       Balance Overall balance assessment: Mild deficits observed, not formally tested                                           Pertinent Vitals/Pain Pain Assessment: No/denies pain    Home Living Family/patient expects to be discharged to:: Private residence Living Arrangements: Parent Available Help at Discharge: Family;Available 24 hours/day Type of Home: House       Home Layout: One level        Prior Function Level of Independence: Independent               Hand Dominance        Extremity/Trunk Assessment   Upper Extremity Assessment Upper Extremity Assessment: Overall WFL for tasks assessed    Lower Extremity Assessment Lower Extremity Assessment: Overall WFL for tasks assessed    Cervical / Trunk Assessment Cervical / Trunk Assessment: Normal  Communication   Communication: No difficulties  Cognition   Behavior During Therapy: WFL for tasks assessed/performed Overall Cognitive Status: Within Functional Limits for tasks assessed  General Comments      Exercises     Assessment/Plan    PT Assessment All further PT needs can be met in the next venue of care  PT Problem List Decreased strength       PT Treatment Interventions      PT Goals (Current goals can be found in the Care Plan section)  Acute Rehab PT Goals Patient Stated Goal: go home PT Goal Formulation: With patient    Frequency     Barriers to discharge        Co-evaluation               AM-PAC PT "6 Clicks" Mobility  Outcome Measure Help needed turning from your back to your side while in a flat bed without using  bedrails?: None Help needed moving from lying on your back to sitting on the side of a flat bed without using bedrails?: None Help needed moving to and from a bed to a chair (including a wheelchair)?: None Help needed standing up from a chair using your arms (e.g., wheelchair or bedside chair)?: None Help needed to walk in hospital room?: None Help needed climbing 3-5 steps with a railing? : A Little 6 Click Score: 23    End of Session Equipment Utilized During Treatment: Gait belt Activity Tolerance: Patient tolerated treatment well Patient left: in bed Nurse Communication: Mobility status PT Visit Diagnosis: Unsteadiness on feet (R26.81)    Time: 4461-9012 PT Time Calculation (min) (ACUTE ONLY): 23 min   Charges:   PT Evaluation $PT Eval Low Complexity: 1 Low PT Treatments $Gait Training: 8-22 mins       Reinaldo Berber, PT, DPT Acute Rehabilitation Services Pager: [phone] Office: [place] 02/02/2019, 1:30 PM

## 2019-02-02 NOTE — TOC Progression Note (Signed)
Transition of Care [facility]) - Progression Note    Patient Details  Name: Grant [name] MRN: [id number] Date of Birth: 04-11-1981  [facility] CM/SW Contact  Leone Haven, RN Phone Number: 02/02/2019, 1:48 PM  Clinical Narrative:    From home with Dad, per pt eval rec outpatient PT, NCM spoke with patient, he states he is not ready to have this set up for him, that he would like to have the name of facility and he will call to make apt, NCM will have MD to write out a prescription for outpt pt to give to patient so when he is ready for outpt pt he can take the prescription the the facility- [facility], [phone] on [address]  Patient has a follow up apt with his PCP on AVS. He has transportation at discharge.   Expected Discharge Plan: Home/Self Care Barriers to Discharge: No Barriers Identified  Expected Discharge Plan and Services Expected Discharge Plan: Home/Self Care In-house Referral: NA Discharge Planning Services: CM Consult Post Acute Care Choice: NA Living arrangements for the past 2 months: Single Family Home                 DME Arranged: N/A DME Agency: NA         HH Agency: NA         Social Determinants of Health (SDOH) Interventions    Readmission Risk Interventions Readmission Risk Prevention Plan 02/02/2019  Transportation Screening Complete  PCP or Specialist Appt within 5-7 Days Complete  Home Care Screening Complete  Some recent data might be hidden

## 2019-02-05 ENCOUNTER — Encounter (HOSPITAL_COMMUNITY): Payer: Self-pay

## 2019-02-05 ENCOUNTER — Inpatient Hospital Stay (HOSPITAL_COMMUNITY)
Admission: EM | Admit: 2019-02-05 | Discharge: 2019-02-08 | DRG: 353 | Disposition: A | Discharge status: Home / Self Care | Payer: Medicaid - Out of State | Attending: Internal Medicine | Admitting: Internal Medicine

## 2019-02-05 ENCOUNTER — Emergency Department (HOSPITAL_COMMUNITY): Payer: Medicaid - Out of State

## 2019-02-05 ENCOUNTER — Other Ambulatory Visit: Payer: Self-pay

## 2019-02-05 DIAGNOSIS — D649 Anemia, unspecified: Secondary | ICD-10-CM | POA: Diagnosis present

## 2019-02-05 DIAGNOSIS — Z6829 Body mass index (BMI) 29.0-29.9, adult: Secondary | ICD-10-CM | POA: Diagnosis not present

## 2019-02-05 DIAGNOSIS — K420 Umbilical hernia with obstruction, without gangrene: Principal | ICD-10-CM | POA: Diagnosis present

## 2019-02-05 DIAGNOSIS — K436 Other and unspecified ventral hernia with obstruction, without gangrene: Secondary | ICD-10-CM | POA: Diagnosis present

## 2019-02-05 DIAGNOSIS — K760 Fatty (change of) liver, not elsewhere classified: Secondary | ICD-10-CM | POA: Diagnosis present

## 2019-02-05 DIAGNOSIS — R1033 Periumbilical pain: Secondary | ICD-10-CM

## 2019-02-05 DIAGNOSIS — K219 Gastro-esophageal reflux disease without esophagitis: Secondary | ICD-10-CM | POA: Diagnosis present

## 2019-02-05 DIAGNOSIS — Z8371 Family history of colonic polyps: Secondary | ICD-10-CM | POA: Diagnosis not present

## 2019-02-05 DIAGNOSIS — K7030 Alcoholic cirrhosis of liver without ascites: Secondary | ICD-10-CM | POA: Diagnosis not present

## 2019-02-05 DIAGNOSIS — Z86718 Personal history of other venous thrombosis and embolism: Secondary | ICD-10-CM | POA: Diagnosis not present

## 2019-02-05 DIAGNOSIS — Z79899 Other long term (current) drug therapy: Secondary | ICD-10-CM | POA: Diagnosis not present

## 2019-02-05 DIAGNOSIS — I81 Portal vein thrombosis: Secondary | ICD-10-CM | POA: Diagnosis present

## 2019-02-05 DIAGNOSIS — K766 Portal hypertension: Secondary | ICD-10-CM | POA: Diagnosis present

## 2019-02-05 DIAGNOSIS — J869 Pyothorax without fistula: Secondary | ICD-10-CM | POA: Diagnosis present

## 2019-02-05 DIAGNOSIS — R161 Splenomegaly, not elsewhere classified: Secondary | ICD-10-CM | POA: Diagnosis present

## 2019-02-05 DIAGNOSIS — Z8249 Family history of ischemic heart disease and other diseases of the circulatory system: Secondary | ICD-10-CM | POA: Diagnosis not present

## 2019-02-05 DIAGNOSIS — E669 Obesity, unspecified: Secondary | ICD-10-CM | POA: Diagnosis present

## 2019-02-05 DIAGNOSIS — K7460 Unspecified cirrhosis of liver: Secondary | ICD-10-CM | POA: Diagnosis present

## 2019-02-05 DIAGNOSIS — D539 Nutritional anemia, unspecified: Secondary | ICD-10-CM | POA: Diagnosis present

## 2019-02-05 DIAGNOSIS — D696 Thrombocytopenia, unspecified: Secondary | ICD-10-CM | POA: Diagnosis present

## 2019-02-05 DIAGNOSIS — K7290 Hepatic failure, unspecified without coma: Secondary | ICD-10-CM | POA: Diagnosis present

## 2019-02-05 DIAGNOSIS — J90 Pleural effusion, not elsewhere classified: Secondary | ICD-10-CM | POA: Diagnosis present

## 2019-02-05 DIAGNOSIS — K7031 Alcoholic cirrhosis of liver with ascites: Secondary | ICD-10-CM | POA: Diagnosis present

## 2019-02-05 DIAGNOSIS — Z833 Family history of diabetes mellitus: Secondary | ICD-10-CM | POA: Diagnosis not present

## 2019-02-05 DIAGNOSIS — E538 Deficiency of other specified B group vitamins: Secondary | ICD-10-CM | POA: Diagnosis present

## 2019-02-05 DIAGNOSIS — R188 Other ascites: Secondary | ICD-10-CM

## 2019-02-05 DIAGNOSIS — D509 Iron deficiency anemia, unspecified: Secondary | ICD-10-CM | POA: Diagnosis present

## 2019-02-05 DIAGNOSIS — K7011 Alcoholic hepatitis with ascites: Secondary | ICD-10-CM | POA: Diagnosis present

## 2019-02-05 DIAGNOSIS — Z1159 Encounter for screening for other viral diseases: Secondary | ICD-10-CM | POA: Diagnosis not present

## 2019-02-05 DIAGNOSIS — K429 Umbilical hernia without obstruction or gangrene: Secondary | ICD-10-CM | POA: Diagnosis not present

## 2019-02-05 HISTORY — DX: Pneumonia, unspecified organism: J18.9

## 2019-02-05 LAB — URINALYSIS, ROUTINE W REFLEX MICROSCOPIC
Bilirubin Urine: NEGATIVE
Glucose, UA: NEGATIVE mg/dL
Ketones, ur: NEGATIVE mg/dL
Leukocytes,Ua: NEGATIVE
Nitrite: NEGATIVE
Protein, ur: NEGATIVE mg/dL
Specific Gravity, Urine: 1.023 (ref 1.005–1.030)
pH: 5 (ref 5.0–8.0)

## 2019-02-05 LAB — COMPREHENSIVE METABOLIC PANEL
ALT: 31 U/L (ref 0–44)
AST: 78 U/L — ABNORMAL HIGH (ref 15–41)
Albumin: 2.4 g/dL — ABNORMAL LOW (ref 3.5–5.0)
Alkaline Phosphatase: 87 U/L (ref 38–126)
Anion gap: 7 (ref 5–15)
BUN: 10 mg/dL (ref 6–20)
CO2: 18 mmol/L — ABNORMAL LOW (ref 22–32)
Calcium: 9.3 mg/dL (ref 8.9–10.3)
Chloride: 109 mmol/L (ref 98–111)
Creatinine, Ser: 0.64 mg/dL (ref 0.61–1.24)
GFR calc Af Amer: >60 mL/min (ref >60)
GFR calc non Af Amer: >60 mL/min (ref >60)
Glucose, Bld: 97 mg/dL (ref 70–99)
Potassium: 3.8 mmol/L (ref 3.5–5.1)
Sodium: 134 mmol/L — ABNORMAL LOW (ref 135–145)
Total Bilirubin: 3.9 mg/dL — ABNORMAL HIGH (ref 0.3–1.2)
Total Protein: 8 g/dL (ref 6.5–8.1)

## 2019-02-05 LAB — CBC WITH DIFFERENTIAL/PLATELET
Abs Immature Granulocytes: 0.09 10*3/uL — ABNORMAL HIGH (ref 0.00–0.07)
Basophils Absolute: 0.1 10*3/uL (ref 0.0–0.1)
Basophils Relative: 1 %
Eosinophils Absolute: 0.2 10*3/uL (ref 0.0–0.5)
Eosinophils Relative: 4 %
HCT: 24.7 % — ABNORMAL LOW (ref 39.0–52.0)
Hemoglobin: 7.9 g/dL — ABNORMAL LOW (ref 13.0–17.0)
Immature Granulocytes: 2 %
Lymphocytes Relative: 17 %
Lymphs Abs: 0.8 10*3/uL (ref 0.7–4.0)
MCH: 34.1 pg — ABNORMAL HIGH (ref 26.0–34.0)
MCHC: 32 g/dL (ref 30.0–36.0)
MCV: 106.5 fL — ABNORMAL HIGH (ref 80.0–100.0)
Monocytes Absolute: 0.7 10*3/uL (ref 0.1–1.0)
Monocytes Relative: 14 %
Neutro Abs: 3.2 10*3/uL (ref 1.7–7.7)
Neutrophils Relative %: 62 %
Platelets: 143 10*3/uL — ABNORMAL LOW (ref 150–400)
RBC: 2.32 MIL/uL — ABNORMAL LOW (ref 4.22–5.81)
RDW: 17.9 % — ABNORMAL HIGH (ref 11.5–15.5)
WBC: 5.1 10*3/uL (ref 4.0–10.5)
nRBC: 0 % (ref 0.0–0.2)

## 2019-02-05 LAB — PROTIME-INR
INR: 1.4 — ABNORMAL HIGH (ref 0.8–1.2)
Prothrombin Time: 16.9 seconds — ABNORMAL HIGH (ref 11.4–15.2)

## 2019-02-05 LAB — SARS CORONAVIRUS 2 BY RT PCR (HOSPITAL ORDER, PERFORMED IN ~~LOC~~ HOSPITAL LAB): SARS Coronavirus 2: NEGATIVE

## 2019-02-05 LAB — LACTIC ACID, PLASMA: Lactic Acid, Venous: 0.9 mmol/L (ref 0.5–1.9)

## 2019-02-05 MED ORDER — PIPERACILLIN-TAZOBACTAM 3.375 G IVPB
3.3750 g | Freq: Three times a day (TID) | INTRAVENOUS | Status: DC
  Administered 2019-02-06 – 2019-02-08 (×7): 3.375 g via INTRAVENOUS
  Filled 2019-02-05 (×10): qty 50

## 2019-02-05 MED ORDER — IOHEXOL 300 MG/ML  SOLN
100.0000 mL | Freq: Once | INTRAMUSCULAR | Status: AC | PRN
  Administered 2019-02-05: 100 mL via INTRAVENOUS

## 2019-02-05 MED ORDER — PIPERACILLIN-TAZOBACTAM 4.5 G IVPB
4.5000 g | Freq: Once | INTRAVENOUS | Status: DC
  Filled 2019-02-05: qty 100

## 2019-02-05 MED ORDER — MORPHINE SULFATE (PF) 4 MG/ML IV SOLN
4.0000 mg | Freq: Once | INTRAVENOUS | Status: AC
  Administered 2019-02-05: 16:00:00 4 mg via INTRAVENOUS
  Filled 2019-02-05: qty 1

## 2019-02-05 MED ORDER — VANCOMYCIN HCL 10 G IV SOLR
2000.0000 mg | Freq: Once | INTRAVENOUS | Status: AC
  Administered 2019-02-05: 22:00:00 2000 mg via INTRAVENOUS
  Filled 2019-02-05: qty 2000

## 2019-02-05 MED ORDER — PIPERACILLIN-TAZOBACTAM 3.375 G IVPB 30 MIN
3.3750 g | Freq: Once | INTRAVENOUS | Status: AC
  Administered 2019-02-05: 21:00:00 3.375 g via INTRAVENOUS
  Filled 2019-02-05: qty 50

## 2019-02-05 MED ORDER — VANCOMYCIN HCL 10 G IV SOLR
1250.0000 mg | Freq: Two times a day (BID) | INTRAVENOUS | Status: DC
  Administered 2019-02-06 – 2019-02-07 (×3): 1250 mg via INTRAVENOUS
  Filled 2019-02-05 (×5): qty 1250

## 2019-02-05 MED ORDER — MORPHINE SULFATE (PF) 4 MG/ML IV SOLN
4.0000 mg | Freq: Once | INTRAVENOUS | Status: AC
  Administered 2019-02-05: 20:00:00 4 mg via INTRAVENOUS
  Filled 2019-02-05: qty 1

## 2019-02-05 NOTE — ED Provider Notes (Signed)
[facility] EMERGENCY DEPARTMENT Provider Note   CSN: [id number] Arrival date & time: 02/05/19  1451    History   Chief Complaint Chief Complaint  Patient presents with  . Abdominal Pain  . Hernia    HPI Grant [name] is a 38 y.o. male.  HPI 38 year old male with history of alcoholic cirrhosis, portal venous thrombosis no longer on Xarelto, severe microcytic anemia, GERD presents with abdominal pain.  Patient was recently admitted and treated for right lower lobe pneumonia complicated by loculated pleural effusion requiring percutaneous drainage by IR on 01/25/2019.  Patient was also treated for SBP with 5-day course of ceftriaxone.  He was discharged on 5/7 with plans to complete a 3-week course of Augmentin.  He has been compliant with his antibiotics.  He states that this morning he woke up with periumbilical abdominal pain.  He has a known umbilical hernia and states that it is more tender and hard to the touch.  Described as sharp, constant, worse with palpation, and currently rated 8/10.  There is some discoloration of the umbilical hernia which he states is not new.  No fluid drainage from the hernia. Denies nausea or vomiting.  No fevers or urinary symptoms.  He states he has had some cough.  He states otherwise been doing well since discharge until today.  Past Medical History:  Diagnosis Date  . Ascites 2013  . Avascular necrosis (HCC)   . Campylobacter diarrhea 04/2016   bloody diarrhea.   . 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 Hepatitis B and C serologies negative 06/2016.     Marland Kitchen GERD (gastroesophageal reflux disease)   . Hypomagnesemia 01/31/2018  . Splenomegaly 2013  . Thrombocytopenia (HCC) 2013    Patient Active Problem List   Diagnosis Date Noted  . Acute respiratory failure with hypoxia (HCC) 01/21/2019  . RLL pneumonia (HCC) 01/21/2019  . Empyema of right pleural space (HCC) 01/21/2019  . Hyponatremia  01/21/2019  . Supratherapeutic INR 01/21/2019  . Lactic acidosis 01/21/2019  . Portal vein thrombosis 08/02/2018  . Abdominal distension   . Community acquired pneumonia   . LLL pneumonia (HCC) 01/31/2018  . Symptomatic anemia 01/31/2018  . Hypomagnesemia 01/31/2018  . Hypokalemia 01/31/2018  . Esophageal reflux   . Morbid obesity due to excess calories (HCC)   . Dehydration 05/09/2016  . Alcoholic cirrhosis of liver with ascites (HCC) 05/09/2016  . Diarrhea 05/09/2016  . Campylobacter diarrhea 05/09/2016  . Alcohol abuse 05/09/2016  . Hematochezia   . Thrombocytopenia (HCC) 09/29/2011  . Splenomegaly 09/29/2011  . Cirrhosis (HCC) 09/29/2011  . Ascites 09/29/2011    Past Surgical History:  Procedure Laterality Date  . ESOPHAGOGASTRODUODENOSCOPY  08/2015   [place], Dr. 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 IR PARACENTESIS  01/23/2019  . PARACENTESIS  2013   In [place] at [facility] hospital.         Home Medications    Prior to Admission medications   Medication Sig Start Date End Date Taking? Authorizing Provider  amoxicillin-clavulanate (AUGMENTIN) 875-125 MG tablet Take 1 tablet by mouth every 12 (twelve) hours for 21 days. 02/02/19 02/23/19 Yes Zigmund Marquiz Sotelo., MD  folic acid (FOLVITE) 1 MG tablet Take 1 tablet (1 mg total) by mouth daily. 05/10/16  Yes Vassie Loll, MD  furosemide (LASIX) 40 MG tablet Take 1 tablet (40 mg total) by mouth daily for 30 days. 02/02/19  03/04/19 Yes Zigmund DanielPowell, A Caldwell Jr., MD  lactulose (CHRONULAC) 10 GM/15ML solution Take 15 mLs (10 g total) by mouth daily. 02/03/19  Yes Zigmund DanielPowell, A Caldwell Jr., MD  Magnesium 250 MG TABS Take 250 mg by mouth daily.    Yes [provider]  Multiple Vitamin (MULTIVITAMIN) tablet Take 1 tablet by mouth daily.   Yes [provider]  omeprazole (PRILOSEC) 20 MG capsule TAKE ONE CAPSULE BY MOUTH TWICE DAILY BEFORE A MEAL Patient taking  differently: Take 20 mg by mouth 2 (two) times daily before a meal.  10/11/18  Yes Gelene MinkBoone, Anna W, NP  propranolol (INDERAL) 10 MG tablet Take 1 tablet (10 mg total) by mouth 3 (three) times daily for 30 days. 02/02/19 03/04/19 Yes Zigmund DanielPowell, A Caldwell Jr., MD  spironolactone (ALDACTONE) 50 MG tablet Take 1 tablet (50 mg total) by mouth daily for 30 days. 02/03/19 03/05/19 Yes Zigmund DanielPowell, A Caldwell Jr., MD  thiamine 100 MG tablet Take 1 tablet (100 mg total) by mouth daily. 05/10/16  Yes Vassie LollMadera, Carlos, MD    Family History Family History  Problem Relation Age of Onset  . Diabetes Father   . Hypertension Father   . Colon polyps Father [id number]  . Diabetes Other   . Cancer Neg Hx   . Colon cancer Neg Hx   . Liver disease Neg Hx     Social History Social History   Tobacco Use  . Smoking status: Never Smoker  . Smokeless tobacco: Never Used  Substance Use Topics  . Alcohol use: Not Currently    Alcohol/week: 20.0 standard drinks    Types: 20 Cans of beer per week    Comment: history of ETOH abuse, Last drank in April 2019 as of 06/29/18.  . Drug use: No     Allergies   Patient has no known allergies.   Review of Systems Review of Systems  Constitutional: Negative for chills and fever.  HENT: Negative for ear pain and sore throat.   Eyes: Negative for pain and visual disturbance.  Respiratory: Positive for cough. Negative for shortness of breath.   Cardiovascular: Negative for chest pain and palpitations.  Gastrointestinal: Positive for abdominal pain. Negative for nausea and vomiting.  Genitourinary: Negative for dysuria and hematuria.  Musculoskeletal: Negative for arthralgias and back pain.  Skin: Negative for color change and rash.  Neurological: Negative for seizures and syncope.  All other systems reviewed and are negative.    Physical Exam Updated Vital Signs BP 125/64   Pulse 74   Temp 98.5 F (36.9 C) (Oral)   Resp 17   Ht 5\' 10"  (1.778 m)   Wt 94.6 kg   SpO2 95%   BMI  29.92 kg/m   Physical Exam Vitals signs and nursing note reviewed.  Constitutional:      Appearance: He is well-developed.  HENT:     Head: Normocephalic and atraumatic.  Eyes:     Conjunctiva/sclera: Conjunctivae normal.  Neck:     Musculoskeletal: Neck supple.  Cardiovascular:     Rate and Rhythm: Normal rate and regular rhythm.     Heart sounds: No murmur.  Pulmonary:     Effort: Pulmonary effort is normal. No respiratory distress.     Breath sounds: Examination of the right-middle field reveals decreased breath sounds. Examination of the right-lower field reveals decreased breath sounds. Decreased breath sounds present.  Abdominal:     Palpations: Abdomen is soft.     Tenderness: There is abdominal tenderness in the  periumbilical area.     Comments: Umbilical hernia with overlying skin changes, area is indurated and tender  Skin:    General: Skin is warm and dry.  Neurological:     General: No focal deficit present.     Mental Status: He is alert.      ED Treatments / Results  Labs (all labs ordered are listed, but only abnormal results are displayed) Labs Reviewed  CBC WITH DIFFERENTIAL/PLATELET - Abnormal; Notable for the following components:      Result Value   RBC 2.32 (*)    Hemoglobin 7.9 (*)    HCT 24.7 (*)    MCV 106.5 (*)    MCH 34.1 (*)    RDW 17.9 (*)    Platelets 143 (*)    Abs Immature Granulocytes 0.09 (*)    All other components within normal limits  COMPREHENSIVE METABOLIC PANEL - Abnormal; Notable for the following components:   Sodium 134 (*)    CO2 18 (*)    Albumin 2.4 (*)    AST 78 (*)    Total Bilirubin 3.9 (*)    All other components within normal limits  URINALYSIS, ROUTINE W REFLEX MICROSCOPIC - Abnormal; Notable for the following components:   Hgb urine dipstick LARGE (*)    Bacteria, UA RARE (*)    All other components within normal limits  PROTIME-INR - Abnormal; Notable for the following components:   Prothrombin Time 16.9  (*)    INR 1.4 (*)    All other components within normal limits  SARS CORONAVIRUS 2 (HOSPITAL ORDER, PERFORMED IN [facility])  CULTURE, BLOOD (ROUTINE X 2)  CULTURE, BLOOD (ROUTINE X 2)  LACTIC ACID, PLASMA    EKG None  Radiology Ct Abdomen Pelvis W Contrast  Result Date: 02/05/2019 CLINICAL DATA:  Abdominal pain, umbilical hernia EXAM: CT ABDOMEN AND PELVIS WITH CONTRAST TECHNIQUE: Multidetector CT imaging of the abdomen and pelvis was performed using the standard protocol following bolus administration of intravenous contrast. CONTRAST:  OMNIPAQUE IOHEXOL 300 MG/ML  SOLN COMPARISON:  01/21/2019 FINDINGS: Lower chest: Redemonstrated right pleural effusion with high density, likely loculated fluid collection of the dependent pleural space similar in size to prior examination, following drainage procedure. Associated atelectasis or consolidation of the right lower lobe. Small left pleural effusion. Hepatobiliary: Cirrhotic morphology of the liver. Tiny gallstone in the bladder. No gallbladder wall thickening, or biliary dilatation. Pancreas: Unremarkable. No pancreatic ductal dilatation or surrounding inflammatory changes. Spleen: Splenomegaly. Adrenals/Urinary Tract: Adrenal glands are unremarkable. Kidneys are normal, without renal calculi, focal lesion, or hydronephrosis. Bladder is unremarkable. Stomach/Bowel: Stomach is within normal limits. No evidence of bowel wall thickening, distention, or inflammatory changes.The small bowel is generally decompressed or air and fluid-filled without significant distention. Largest small bowel loops measure 3.1 cm in caliber. Vascular/Lymphatic: No significant vascular findings are present. No enlarged abdominal or pelvic lymph nodes. Reproductive: No mass or other abnormality. Other: Redemonstrated small bowel and fluid containing umbilical hernia. Small volume ascites. Musculoskeletal: No acute or significant osseous findings. IMPRESSION:  1. Redemonstrated small bowel and fluid containing umbilical hernia. The small bowel is generally decompressed or air and fluid-filled without significant distention. Lack of obstruction by CT does not exclude incarceration or strangulation if clinically suspected. 2.  Small volume ascites. 3.  Stigmata of cirrhosis and splenomegaly. 4. Redemonstrated right pleural effusion with high density, likely loculated fluid collection of the dependent pleural space similar in size to prior examination, following drainage procedure. Associated  atelectasis or consolidation of the right lower lobe. Small left pleural effusion. Electronically Signed   By: Lauralyn Primes M.D.   On: 02/05/2019 18:04   Dg Chest Portable 1 View  Result Date: 02/05/2019 CLINICAL DATA:  38 year old male with abdominal pain for 1 day. Cirrhosis. Right lung pneumonia and loculated pleural effusion last month, pleural drain placed with CT guidance on 01/25/2019. EXAM: PORTABLE CHEST 1 VIEW COMPARISON:  Chest radiographs 01/31/2019 and earlier. FINDINGS: Portable AP semi upright view at 1524 hours. Stable veiling opacity in the right lower lung. Stable lung volumes and mediastinal contours. Visualized tracheal air column is within normal limits. No pneumothorax or pulmonary edema. No confluent opacity in the left lung. No acute osseous abnormality identified. IMPRESSION: Stable right pleural effusion since 01/31/2019. No new cardiopulmonary abnormality. Electronically Signed   By: Odessa Fleming M.D.   On: 02/05/2019 15:33    Procedures Procedures (including critical care time)  Medications Ordered in ED Medications  vancomycin (VANCOCIN) 2,000 mg in sodium chloride 0.9 % 500 mL IVPB (2,000 mg Intravenous New Bag/Given 02/05/19 2157)  morphine 4 MG/ML injection 4 mg (4 mg Intravenous Given 02/05/19 1620)  iohexol (OMNIPAQUE) 300 MG/ML solution 100 mL (100 mLs Intravenous Contrast Given 02/05/19 1732)  morphine 4 MG/ML injection 4 mg (4 mg Intravenous  Given 02/05/19 2019)  piperacillin-tazobactam (ZOSYN) IVPB 3.375 g (0 g Intravenous Stopped 02/05/19 2156)     Initial Impression / Assessment and Plan / ED Course  I have reviewed the triage vital signs and the nursing notes.  Pertinent labs & imaging results that were available during my care of the patient were reviewed by me and considered in my medical decision making (see chart for details).  38 year old male with history of alcoholic cirrhosis, portal venous thrombosis no longer on Xarelto, severe microcytic anemia, GERD presents with abdominal pain.  Patient hemodynamically stable.  Afebrile.  My evaluation, he had sats in the high 21s with a good waveform.    On exam, he has diminished breath sounds in the right base concerning for recurrence of his pleural effusion.  Patient has tenderness over his umbilical hernia.   CT of the abdomen and pelvis shows read demonstrated small bowel and fluid containing umbilical hernia.  Small bowel externally decompressed of air and fluid-filled without significant distention.  Small volume ascites.  Stigmata of cirrhosis and splenomegaly.  Redemonstrated right pleural effusion with high density, likely loculated fluid collection of the deep and pleural space similar in size to prior examination following drainage procedure.  Case discussed with general surgery.  Patient is a poor surgical candidate and they would not intervene at this time.  Recommended medicine admission.  Chest x-ray shows stable right pleural effusion compared to 01/31/2019.  No new cardiopulmonary abnormality.  Labs notable for WBC 5.1, hemoglobin 7.9, platelets 143.  INR 1.4. Lactic acid 0.9 sodium 134, CO2 18, AST 78.  Patient admitted to the hospitalist for further management.  They recommended obtaining blood cultures and starting antibiotics.  Orders written for vancomycin and Zosyn.   Final Clinical Impressions(s) / ED Diagnoses   Final diagnoses:  None    ED  Discharge Orders    None       Vallery Ridge, MD 02/05/19 2259    Sabas Sous, MD 02/08/19 1615

## 2019-02-05 NOTE — Progress Notes (Signed)
Pharmacy Antibiotic Note  Grant [name] is a 38 y.o. male admitted on 02/05/2019 with abdominal pain/possible sepsis.  Pharmacy has been consulted for Vancomycin and Zosyn  Dosing.  Vancomycin 2 g IV given in ED at 2200  Plan: Vancomycin 1250 mg IV q12h Zosyn 3.375 g IV q8h   Height: 5\' 10"  (177.8 cm) Weight: 208 lb 8.9 oz (94.6 kg) IBW/kg (Calculated) : 73  Temp (24hrs), Avg:98.5 F (36.9 C), Min:98.5 F (36.9 C), Max:98.5 F (36.9 C)  Recent Labs  Lab 01/30/19 0553 01/31/19 0517 02/01/19 0457 02/01/19 1609 02/02/19 0239 02/05/19 1531  WBC 17.5* 11.8* 8.9  --  9.8 5.1  CREATININE 1.85* 1.30* 1.17 0.95  --  0.64  LATICACIDVEN  --   --   --   --   --  0.9    Estimated Creatinine Clearance: 145.9 mL/min (by C-G formula based on SCr of 0.64 mg/dL).    No Known Allergies   Eddie Candle 02/05/2019 11:30 PM

## 2019-02-05 NOTE — ED Notes (Signed)
Tried to call report. Will call back in 5 minutes

## 2019-02-05 NOTE — ED Notes (Signed)
ED TO INPATIENT HANDOFF REPORT  ED Nurse Name and Phone #:  Kyley Solow [phone]  S Name/Age/Gender Grant [name] 38 y.o. male Room/Bed: 020C/020C  Code Status   Code Status: Prior  Home/SNF/Other Home Patient oriented to: self, place, time and situation Is this baseline? Yes   Triage Complete: Triage complete  Chief Complaint abd pain  Triage Note Pt presents with c/o abdominal pain x1 day. Pt has hx of umbilical hernia x1 year, states at baseline it is soft but last night it became hard to the touch. Pt denies fever, denies other symptoms. States he was recently admitted for pneumonia.    Allergies No Known Allergies  Level of Care/Admitting Diagnosis ED Disposition    ED Disposition Condition Comment   Admit  Hospital Area: [facility] [[id number]]  Level of Care: Telemetry Medical [104]  Covid Evaluation: N/A  Diagnosis: Obstructed umbilical hernia [250409]  Admitting Physician: Eduard Clos [phone]  Attending Physician: Eduard Clos [phone]  Estimated length of stay: past midnight tomorrow  Certification:: I certify this patient will need inpatient services for at least 2 midnights  PT Class (Do Not Modify): Inpatient [101]  PT Acc Code (Do Not Modify): Private [1]       B Medical/Surgery History Past Medical History:  Diagnosis Date  . Ascites 2013  . Avascular necrosis (HCC)   . Campylobacter diarrhea 04/2016   bloody diarrhea.   . 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 Hepatitis B and C serologies negative 06/2016.     Marland Kitchen GERD (gastroesophageal reflux disease)   . Hypomagnesemia 01/31/2018  . Splenomegaly 2013  . Thrombocytopenia (HCC) 2013   Past Surgical History:  Procedure Laterality Date  . ESOPHAGOGASTRODUODENOSCOPY  08/2015   [place], Dr. 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 IR PARACENTESIS  01/23/2019  . PARACENTESIS  2013   In [place] at  [facility].      A IV Location/Drains/Wounds Patient Lines/Drains/Airways Status   Active Line/Drains/Airways    Name:   Placement date:   Placement time:   Site:   Days:   Peripheral IV 02/05/19 Left Antecubital   02/05/19    1630    Antecubital   less than 1          Intake/Output Last 24 hours  Intake/Output Summary (Last 24 hours) at 02/05/2019 2328 Last data filed at 02/05/2019 2156 Gross per 24 hour  Intake 100 ml  Output -  Net 100 ml    Labs/Imaging Results for orders placed or performed during the hospital encounter of 02/05/19 (from the past 48 hour(s))  CBC with Differential     Status: Abnormal   Collection Time: 02/05/19  3:31 PM  Result Value Ref Range   WBC 5.1 4.0 - 10.5 K/uL   RBC 2.32 (L) 4.22 - 5.81 MIL/uL   Hemoglobin 7.9 (L) 13.0 - 17.0 g/dL   HCT 54.0 (L) 98.1 - 19.1 %   MCV 106.5 (H) 80.0 - 100.0 fL   MCH 34.1 (H) 26.0 - 34.0 pg   MCHC 32.0 30.0 - 36.0 g/dL   RDW 47.8 (H) 29.5 - 62.1 %   Platelets 143 (L) 150 - 400 K/uL   nRBC 0.0 0.0 - 0.2 %   Neutrophils Relative % 62 %   Neutro Abs 3.2 1.7 - 7.7 K/uL   Lymphocytes Relative 17 %   Lymphs Abs 0.8 0.7 -  4.0 K/uL   Monocytes Relative 14 %   Monocytes Absolute 0.7 0.1 - 1.0 K/uL   Eosinophils Relative 4 %   Eosinophils Absolute 0.2 0.0 - 0.5 K/uL   Basophils Relative 1 %   Basophils Absolute 0.1 0.0 - 0.1 K/uL   Immature Granulocytes 2 %   Abs Immature Granulocytes 0.09 (H) 0.00 - 0.07 K/uL    Comment: Performed at [facility], [address] [place], [place] [address]  Comprehensive metabolic panel     Status: Abnormal   Collection Time: 02/05/19  3:31 PM  Result Value Ref Range   Sodium 134 (L) 135 - 145 mmol/L   Potassium 3.8 3.5 - 5.1 mmol/L   Chloride 109 98 - 111 mmol/L   CO2 18 (L) 22 - 32 mmol/L   Glucose, Bld 97 70 - 99 mg/dL   BUN 10 6 - 20 mg/dL   Creatinine, Ser 6.040.64 0.61 - 1.24 mg/dL   Calcium 9.3 8.9 - 54.010.3 mg/dL   Total Protein 8.0 6.5 - 8.1  g/dL   Albumin 2.4 (L) 3.5 - 5.0 g/dL   AST 78 (H) 15 - 41 U/L   ALT 31 0 - 44 U/L   Alkaline Phosphatase 87 38 - 126 U/L   Total Bilirubin 3.9 (H) 0.3 - 1.2 mg/dL   GFR calc non Af Amer >60 >60 mL/min   GFR calc Af Amer >60 >60 mL/min   Anion gap 7 5 - 15    Comment: Performed at [facility], [address] [place], [place] [address]  Lactic acid, plasma     Status: None   Collection Time: 02/05/19  3:31 PM  Result Value Ref Range   Lactic Acid, Venous 0.9 0.5 - 1.9 mmol/L    Comment: Performed at [facility], [address] [place], [place] [address]  Protime-INR     Status: Abnormal   Collection Time: 02/05/19  3:31 PM  Result Value Ref Range   Prothrombin Time 16.9 (H) 11.4 - 15.2 seconds   INR 1.4 (H) 0.8 - 1.2    Comment: (NOTE) INR goal varies based on device and disease states. Performed at [facility], [address] [place], [place] [address]   Urinalysis, Routine w reflex microscopic     Status: Abnormal   Collection Time: 02/05/19  7:04 PM  Result Value Ref Range   Color, Urine YELLOW YELLOW   APPearance CLEAR CLEAR   Specific Gravity, Urine 1.023 1.005 - 1.030   pH 5.0 5.0 - 8.0   Glucose, UA NEGATIVE NEGATIVE mg/dL   Hgb urine dipstick LARGE (A) NEGATIVE   Bilirubin Urine NEGATIVE NEGATIVE   Ketones, ur NEGATIVE NEGATIVE mg/dL   Protein, ur NEGATIVE NEGATIVE mg/dL   Nitrite NEGATIVE NEGATIVE   Leukocytes,Ua NEGATIVE NEGATIVE   RBC / HPF 11-20 0 - 5 RBC/hpf   WBC, UA 0-5 0 - 5 WBC/hpf   Bacteria, UA RARE (A) NONE SEEN   Mucus PRESENT     Comment: Performed at [facility], [address] [place], [place] [address]  SARS Coronavirus 2 (CEPHEID - Performed in [facility] hospital lab), Hosp Order     Status: None   Collection Time: 02/05/19  9:09 PM  Result Value Ref Range   SARS Coronavirus 2 NEGATIVE NEGATIVE    Comment: (NOTE) If result is NEGATIVE SARS-CoV-2 target nucleic acids are NOT DETECTED. The SARS-CoV-2 RNA is  generally detectable in upper and  lower  respiratory specimens during the acute phase of infection. The lowest  concentration of SARS-CoV-2 viral copies this assay can detect is 250  copies / mL. A negative result does not preclude SARS-CoV-2 infection  and should not be used as the sole basis for treatment or other  patient management decisions.  A negative result may occur with  improper specimen collection / handling, submission of specimen other  than nasopharyngeal swab, presence of viral mutation(s) within the  areas targeted by this assay, and inadequate number of viral copies  (<250 copies / mL). A negative result must be combined with clinical  observations, patient history, and epidemiological information. If result is POSITIVE SARS-CoV-2 target nucleic acids are DETECTED. The SARS-CoV-2 RNA is generally detectable in upper and lower  respiratory specimens dur ing the acute phase of infection.  Positive  results are indicative of active infection with SARS-CoV-2.  Clinical  correlation with patient history and other diagnostic information is  necessary to determine patient infection status.  Positive results do  not rule out bacterial infection or co-infection with other viruses. If result is PRESUMPTIVE POSTIVE SARS-CoV-2 nucleic acids MAY BE PRESENT.   A presumptive positive result was obtained on the submitted specimen  and confirmed on repeat testing.  While 2019 novel coronavirus  (SARS-CoV-2) nucleic acids may be present in the submitted sample  additional confirmatory testing may be necessary for epidemiological  and / or clinical management purposes  to differentiate between  SARS-CoV-2 and other Sarbecovirus currently known to infect humans.  If clinically indicated additional testing with an alternate test  methodology [phone]) is advised. The SARS-CoV-2 RNA is generally  detectable in upper and lower respiratory sp ecimens during the acute  phase of  infection. The expected result is Negative. Fact Sheet for Patients:  BoilerBrush.com.cy Fact Sheet for Healthcare Providers: https://pope.com/ This test is not yet approved or cleared by the [place] FDA and has been authorized for detection and/or diagnosis of SARS-CoV-2 by FDA under an Emergency Use Authorization (EUA).  This EUA will remain in effect (meaning this test can be used) for the duration of the COVID-19 declaration under Section 564(b)(1) of the Act, 21 U.S.C. section 360bbb-3(b)(1), unless the authorization is terminated or revoked sooner. Performed at [facility], [address], [place], [place] [address]    Ct Abdomen Pelvis W Contrast  Result Date: 02/05/2019 CLINICAL DATA:  Abdominal pain, umbilical hernia EXAM: CT ABDOMEN AND PELVIS WITH CONTRAST TECHNIQUE: Multidetector CT imaging of the abdomen and pelvis was performed using the standard protocol following bolus administration of intravenous contrast. CONTRAST:  OMNIPAQUE IOHEXOL 300 MG/ML  SOLN COMPARISON:  01/21/2019 FINDINGS: Lower chest: Redemonstrated right pleural effusion with high density, likely loculated fluid collection of the dependent pleural space similar in size to prior examination, following drainage procedure. Associated atelectasis or consolidation of the right lower lobe. Small left pleural effusion. Hepatobiliary: Cirrhotic morphology of the liver. Tiny gallstone in the bladder. No gallbladder wall thickening, or biliary dilatation. Pancreas: Unremarkable. No pancreatic ductal dilatation or surrounding inflammatory changes. Spleen: Splenomegaly. Adrenals/Urinary Tract: Adrenal glands are unremarkable. Kidneys are normal, without renal calculi, focal lesion, or hydronephrosis. Bladder is unremarkable. Stomach/Bowel: Stomach is within normal limits. No evidence of bowel wall thickening, distention, or inflammatory changes.The small bowel is  generally decompressed or air and fluid-filled without significant distention. Largest small bowel loops measure 3.1 cm in caliber. Vascular/Lymphatic: No significant vascular findings are present. No enlarged abdominal or pelvic lymph nodes.  Reproductive: No mass or other abnormality. Other: Redemonstrated small bowel and fluid containing umbilical hernia. Small volume ascites. Musculoskeletal: No acute or significant osseous findings. IMPRESSION: 1. Redemonstrated small bowel and fluid containing umbilical hernia. The small bowel is generally decompressed or air and fluid-filled without significant distention. Lack of obstruction by CT does not exclude incarceration or strangulation if clinically suspected. 2.  Small volume ascites. 3.  Stigmata of cirrhosis and splenomegaly. 4. Redemonstrated right pleural effusion with high density, likely loculated fluid collection of the dependent pleural space similar in size to prior examination, following drainage procedure. Associated atelectasis or consolidation of the right lower lobe. Small left pleural effusion. Electronically Signed   By: Lauralyn Primes M.D.   On: 02/05/2019 18:04   Dg Chest Portable 1 View  Result Date: 02/05/2019 CLINICAL DATA:  38 year old male with abdominal pain for 1 day. Cirrhosis. Right lung pneumonia and loculated pleural effusion last month, pleural drain placed with CT guidance on 01/25/2019. EXAM: PORTABLE CHEST 1 VIEW COMPARISON:  Chest radiographs 01/31/2019 and earlier. FINDINGS: Portable AP semi upright view at 1524 hours. Stable veiling opacity in the right lower lung. Stable lung volumes and mediastinal contours. Visualized tracheal air column is within normal limits. No pneumothorax or pulmonary edema. No confluent opacity in the left lung. No acute osseous abnormality identified. IMPRESSION: Stable right pleural effusion since 01/31/2019. No new cardiopulmonary abnormality. Electronically Signed   By: Odessa Fleming M.D.   On:  02/05/2019 15:33    Pending Labs Unresulted Labs (From admission, onward)    Start     Ordered   02/05/19 2031  Blood culture (routine x 2)  BLOOD CULTURE X 2,   STAT     02/05/19 2031          Vitals/Pain Today's Vitals   02/05/19 2309 02/05/19 2310 02/05/19 2310 02/05/19 2315  BP:    115/63  Pulse:    74  Resp:    16  Temp:      TempSrc:      SpO2: 95%   93%  Weight:      Height:      PainSc:  7  7      Isolation Precautions No active isolations  Medications Medications  vancomycin (VANCOCIN) 2,000 mg in sodium chloride 0.9 % 500 mL IVPB (2,000 mg Intravenous New Bag/Given 02/05/19 2157)  morphine 4 MG/ML injection 4 mg (4 mg Intravenous Given 02/05/19 1620)  iohexol (OMNIPAQUE) 300 MG/ML solution 100 mL (100 mLs Intravenous Contrast Given 02/05/19 1732)  morphine 4 MG/ML injection 4 mg (4 mg Intravenous Given 02/05/19 2019)  piperacillin-tazobactam (ZOSYN) IVPB 3.375 g (0 g Intravenous Stopped 02/05/19 2156)    Mobility walks with person assist Low fall risk   Focused Assessments GI   R Recommendations: See Admitting Provider Note  Report given to:   Additional Notes:

## 2019-02-05 NOTE — Consult Note (Signed)
Reason for Consult:umbilical hernia, cirrhosis Referring Physician: Dr Grant [name] is an 38 y.o. male.  HPI: 68 yom with childs c cirrhosis from etoh recently discharged from hospital.  He had rll e coli pna with empyema treated with abx and chest tube.  He also had sbp treated with paracentesis on 4/27.  He states no etoh in one year. He states he is trying to get seen at [facility] for tx.  He has seen someone for liver in [place] one time.  He is on lasix/aldactone and this manages his ascites.  He has had a umbilical hernia for a year.  This is present with bowel in it on prior scans.  This is usually soft. Today it has become more tender and hard. He states a few months ago he had leakage of fluid from site that he treated with stoma bag. This resolved.  He has no n/v.  No fever. He is having bms/passing flatus.  Had ct scan that is not to different than another recent one but his symptoms have certainly changed.   Past Medical History:  Diagnosis Date  . Ascites 2013  . Avascular necrosis (HCC)   . Campylobacter diarrhea 04/2016   bloody diarrhea.   . 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 Hepatitis B and C serologies negative 06/2016.     Marland Kitchen GERD (gastroesophageal reflux disease)   . Hypomagnesemia 01/31/2018  . Splenomegaly 2013  . Thrombocytopenia (HCC) 2013    Past Surgical History:  Procedure Laterality Date  . ESOPHAGOGASTRODUODENOSCOPY  08/2015   [place], Dr. 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 IR PARACENTESIS  01/23/2019  . PARACENTESIS  2013   In [place] at [facility] associated hospital.     Family History  Problem Relation Age of Onset  . Diabetes Father   . Hypertension Father   . Colon polyps Father 54  . Diabetes Other   . Cancer Neg Hx   . Colon cancer Neg Hx   . Liver disease Neg Hx     Social History:  reports that he has never smoked. He has never used smokeless tobacco. He  reports previous alcohol use of about 20.0 standard drinks of alcohol per week. He reports that he does not use drugs.  Allergies: No Known Allergies  Medications: I have reviewed the patient's current medications.  Results for orders placed or performed during the hospital encounter of 02/05/19 (from the past 48 hour(s))  CBC with Differential     Status: Abnormal   Collection Time: 02/05/19  3:31 PM  Result Value Ref Range   WBC 5.1 4.0 - 10.5 K/uL   RBC 2.32 (L) 4.22 - 5.81 MIL/uL   Hemoglobin 7.9 (L) 13.0 - 17.0 g/dL   HCT 09.8 (L) 11.9 - 14.7 %   MCV 106.5 (H) 80.0 - 100.0 fL   MCH 34.1 (H) 26.0 - 34.0 pg   MCHC 32.0 30.0 - 36.0 g/dL   RDW 82.9 (H) 56.2 - 13.0 %   Platelets 143 (L) 150 - 400 K/uL   nRBC 0.0 0.0 - 0.2 %   Neutrophils Relative % 62 %   Neutro Abs 3.2 1.7 - 7.7 K/uL   Lymphocytes Relative 17 %   Lymphs Abs 0.8 0.7 - 4.0 K/uL   Monocytes Relative 14 %   Monocytes Absolute 0.7 0.1 - 1.0 K/uL   Eosinophils Relative 4 %   Eosinophils  Absolute 0.2 0.0 - 0.5 K/uL   Basophils Relative 1 %   Basophils Absolute 0.1 0.0 - 0.1 K/uL   Immature Granulocytes 2 %   Abs Immature Granulocytes 0.09 (H) 0.00 - 0.07 K/uL    Comment: Performed at [facility], [address], [place], [place] [address]  Comprehensive metabolic panel     Status: Abnormal   Collection Time: 02/05/19  3:31 PM  Result Value Ref Range   Sodium 134 (L) 135 - 145 mmol/L   Potassium 3.8 3.5 - 5.1 mmol/L   Chloride 109 98 - 111 mmol/L   CO2 18 (L) 22 - 32 mmol/L   Glucose, Bld 97 70 - 99 mg/dL   BUN 10 6 - 20 mg/dL   Creatinine, Ser 2.42 0.61 - 1.24 mg/dL   Calcium 9.3 8.9 - 35.3 mg/dL   Total Protein 8.0 6.5 - 8.1 g/dL   Albumin 2.4 (L) 3.5 - 5.0 g/dL   AST 78 (H) 15 - 41 U/L   ALT 31 0 - 44 U/L   Alkaline Phosphatase 87 38 - 126 U/L   Total Bilirubin 3.9 (H) 0.3 - 1.2 mg/dL   GFR calc non Af Amer >60 >60 mL/min   GFR calc Af Amer >60 >60 mL/min   Anion gap 7 5 - 15    Comment:  Performed at [facility], [address] [place], [place] [address]  Lactic acid, plasma     Status: None   Collection Time: 02/05/19  3:31 PM  Result Value Ref Range   Lactic Acid, Venous 0.9 0.5 - 1.9 mmol/L    Comment: Performed at [facility], [address] [place], [place] [address]  Protime-INR     Status: Abnormal   Collection Time: 02/05/19  3:31 PM  Result Value Ref Range   Prothrombin Time 16.9 (H) 11.4 - 15.2 seconds   INR 1.4 (H) 0.8 - 1.2    Comment: (NOTE) INR goal varies based on device and disease states. Performed at [facility], [address], [place], [place] [address]   Urinalysis, Routine w reflex microscopic     Status: Abnormal   Collection Time: 02/05/19  7:04 PM  Result Value Ref Range   Color, Urine YELLOW YELLOW   APPearance CLEAR CLEAR   Specific Gravity, Urine 1.023 1.005 - 1.030   pH 5.0 5.0 - 8.0   Glucose, UA NEGATIVE NEGATIVE mg/dL   Hgb urine dipstick LARGE (A) NEGATIVE   Bilirubin Urine NEGATIVE NEGATIVE   Ketones, ur NEGATIVE NEGATIVE mg/dL   Protein, ur NEGATIVE NEGATIVE mg/dL   Nitrite NEGATIVE NEGATIVE   Leukocytes,Ua NEGATIVE NEGATIVE   RBC / HPF 11-20 0 - 5 RBC/hpf   WBC, UA 0-5 0 - 5 WBC/hpf   Bacteria, UA RARE (A) NONE SEEN   Mucus PRESENT     Comment: Performed at [facility], [address] [place], [place] [address]    Ct Abdomen Pelvis W Contrast  Result Date: 02/05/2019 CLINICAL DATA:  Abdominal pain, umbilical hernia EXAM: CT ABDOMEN AND PELVIS WITH CONTRAST TECHNIQUE: Multidetector CT imaging of the abdomen and pelvis was performed using the standard protocol following bolus administration of intravenous contrast. CONTRAST:  OMNIPAQUE IOHEXOL 300 MG/ML  SOLN COMPARISON:  01/21/2019 FINDINGS: Lower chest: Redemonstrated right pleural effusion with high density, likely loculated fluid collection of the dependent pleural space similar in size to prior examination, following drainage procedure.  Associated atelectasis or consolidation  of the right lower lobe. Small left pleural effusion. Hepatobiliary: Cirrhotic morphology of the liver. Tiny gallstone in the bladder. No gallbladder wall thickening, or biliary dilatation. Pancreas: Unremarkable. No pancreatic ductal dilatation or surrounding inflammatory changes. Spleen: Splenomegaly. Adrenals/Urinary Tract: Adrenal glands are unremarkable. Kidneys are normal, without renal calculi, focal lesion, or hydronephrosis. Bladder is unremarkable. Stomach/Bowel: Stomach is within normal limits. No evidence of bowel wall thickening, distention, or inflammatory changes.The small bowel is generally decompressed or air and fluid-filled without significant distention. Largest small bowel loops measure 3.1 cm in caliber. Vascular/Lymphatic: No significant vascular findings are present. No enlarged abdominal or pelvic lymph nodes. Reproductive: No mass or other abnormality. Other: Redemonstrated small bowel and fluid containing umbilical hernia. Small volume ascites. Musculoskeletal: No acute or significant osseous findings. IMPRESSION: 1. Redemonstrated small bowel and fluid containing umbilical hernia. The small bowel is generally decompressed or air and fluid-filled without significant distention. Lack of obstruction by CT does not exclude incarceration or strangulation if clinically suspected. 2.  Small volume ascites. 3.  Stigmata of cirrhosis and splenomegaly. 4. Redemonstrated right pleural effusion with high density, likely loculated fluid collection of the dependent pleural space similar in size to prior examination, following drainage procedure. Associated atelectasis or consolidation of the right lower lobe. Small left pleural effusion. Electronically Signed   By: Lauralyn Primes M.D.   On: 02/05/2019 18:04   Dg Chest Portable 1 View  Result Date: 02/05/2019 CLINICAL DATA:  38 year old male with abdominal pain for 1 day. Cirrhosis. Right lung pneumonia and  loculated pleural effusion last month, pleural drain placed with CT guidance on 01/25/2019. EXAM: PORTABLE CHEST 1 VIEW COMPARISON:  Chest radiographs 01/31/2019 and earlier. FINDINGS: Portable AP semi upright view at 1524 hours. Stable veiling opacity in the right lower lung. Stable lung volumes and mediastinal contours. Visualized tracheal air column is within normal limits. No pneumothorax or pulmonary edema. No confluent opacity in the left lung. No acute osseous abnormality identified. IMPRESSION: Stable right pleural effusion since 01/31/2019. No new cardiopulmonary abnormality. Electronically Signed   By: Odessa Fleming M.D.   On: 02/05/2019 15:33    Review of Systems  Constitutional: Positive for malaise/fatigue.  Gastrointestinal: Positive for abdominal pain.   Blood pressure 125/64, pulse 72, temperature 98.5 F (36.9 C), temperature source Oral, resp. rate 14, height  (1.778 m), weight 94.6 kg, SpO2 95 %. Physical Exam  Constitutional: He is oriented to person, place, and time. He appears lethargic. He appears ill.  HENT:  Head: Normocephalic and atraumatic.  Eyes: Pupils are equal, round, and reactive to light. Scleral icterus is present.  Neck: Neck supple.  Cardiovascular: Normal rate and regular rhythm.  Respiratory: Effort normal and breath sounds normal.  GI: Soft. Bowel sounds are normal. He exhibits no ascites. Fluid wave: i cannot identify any on exam, is obese, has multiple abd wall veins visible. There is abdominal tenderness (at hernia site). A hernia (umbilical hernia incarcerated, midl tender, this is not reducible right now, some chronic skin changes overlying this but no infection and skin intact) is present.  Neurological: He is oriented to person, place, and time. He appears lethargic.  Skin: Skin is warm and dry.    Assessment/Plan: Incarcerated umbilical hernia Fenton Foy cirrhosis-score is 10 -recommend gi consult for assistance mgt in liver disease to see if any  other options to care for him. Im not sure he really has seen someone for this.  -discuss with ir possible paracentesis again but dont think there  is a lot of volume on ct -surgery is last resort.  He certainly is more tender but I dont think this has reduced in months.  He is not obstructed and scan looks similar to last one.  His mortality from surgery is significant with his level of liver disease.  Recommend medical admission and evaluation as above.   - I discussed this plan with him and he understands surgery reserved for an emergency   Grant LoronMatthew Aniza Shor 02/05/2019, 7:59 PM

## 2019-02-05 NOTE — ED Triage Notes (Signed)
Pt presents with c/o abdominal pain x1 day. Pt has hx of umbilical hernia x1 year, states at baseline it is soft but last night it became hard to the touch. Pt denies fever, denies other symptoms. States he was recently admitted for pneumonia.

## 2019-02-06 ENCOUNTER — Inpatient Hospital Stay (HOSPITAL_COMMUNITY): Payer: Medicaid - Out of State | Admitting: Registered Nurse

## 2019-02-06 ENCOUNTER — Encounter (HOSPITAL_COMMUNITY): Payer: Self-pay | Admitting: *Deleted

## 2019-02-06 ENCOUNTER — Encounter (HOSPITAL_COMMUNITY)
Admission: EM | Disposition: A | Discharge status: Home / Self Care | Payer: Self-pay | Source: Home / Self Care | Attending: Internal Medicine

## 2019-02-06 ENCOUNTER — Inpatient Hospital Stay (HOSPITAL_COMMUNITY): Payer: Medicaid - Out of State

## 2019-02-06 ENCOUNTER — Other Ambulatory Visit: Payer: Self-pay

## 2019-02-06 DIAGNOSIS — I81 Portal vein thrombosis: Secondary | ICD-10-CM

## 2019-02-06 DIAGNOSIS — K7031 Alcoholic cirrhosis of liver with ascites: Secondary | ICD-10-CM

## 2019-02-06 DIAGNOSIS — K429 Umbilical hernia without obstruction or gangrene: Secondary | ICD-10-CM

## 2019-02-06 DIAGNOSIS — K7030 Alcoholic cirrhosis of liver without ascites: Secondary | ICD-10-CM

## 2019-02-06 DIAGNOSIS — K420 Umbilical hernia with obstruction, without gangrene: Principal | ICD-10-CM

## 2019-02-06 DIAGNOSIS — D649 Anemia, unspecified: Secondary | ICD-10-CM

## 2019-02-06 HISTORY — PX: LAPAROTOMY: SHX154

## 2019-02-06 LAB — CBC WITH DIFFERENTIAL/PLATELET
Abs Immature Granulocytes: 0.1 10*3/uL — ABNORMAL HIGH (ref 0.00–0.07)
Basophils Absolute: 0.1 10*3/uL (ref 0.0–0.1)
Basophils Relative: 1 %
Eosinophils Absolute: 0.2 10*3/uL (ref 0.0–0.5)
Eosinophils Relative: 4 %
HCT: 25.1 % — ABNORMAL LOW (ref 39.0–52.0)
Hemoglobin: 8.2 g/dL — ABNORMAL LOW (ref 13.0–17.0)
Immature Granulocytes: 2 %
Lymphocytes Relative: 14 %
Lymphs Abs: 0.8 10*3/uL (ref 0.7–4.0)
MCH: 34.2 pg — ABNORMAL HIGH (ref 26.0–34.0)
MCHC: 32.7 g/dL (ref 30.0–36.0)
MCV: 104.6 fL — ABNORMAL HIGH (ref 80.0–100.0)
Monocytes Absolute: 0.7 10*3/uL (ref 0.1–1.0)
Monocytes Relative: 13 %
Neutro Abs: 3.8 10*3/uL (ref 1.7–7.7)
Neutrophils Relative %: 66 %
Platelets: 161 10*3/uL (ref 150–400)
RBC: 2.4 MIL/uL — ABNORMAL LOW (ref 4.22–5.81)
RDW: 17.7 % — ABNORMAL HIGH (ref 11.5–15.5)
WBC: 5.7 10*3/uL (ref 4.0–10.5)
nRBC: 0.3 % — ABNORMAL HIGH (ref 0.0–0.2)

## 2019-02-06 LAB — BASIC METABOLIC PANEL
Anion gap: 12 (ref 5–15)
BUN: 6 mg/dL (ref 6–20)
CO2: 20 mmol/L — ABNORMAL LOW (ref 22–32)
Calcium: 9.7 mg/dL (ref 8.9–10.3)
Chloride: 105 mmol/L (ref 98–111)
Creatinine, Ser: 0.7 mg/dL (ref 0.61–1.24)
GFR calc Af Amer: >60 mL/min (ref >60)
GFR calc non Af Amer: >60 mL/min (ref >60)
Glucose, Bld: 101 mg/dL — ABNORMAL HIGH (ref 70–99)
Potassium: 3.8 mmol/L (ref 3.5–5.1)
Sodium: 137 mmol/L (ref 135–145)

## 2019-02-06 LAB — HEPATIC FUNCTION PANEL
ALT: 31 U/L (ref 0–44)
AST: 77 U/L — ABNORMAL HIGH (ref 15–41)
Albumin: 2.3 g/dL — ABNORMAL LOW (ref 3.5–5.0)
Alkaline Phosphatase: 87 U/L (ref 38–126)
Bilirubin, Direct: 1.5 mg/dL — ABNORMAL HIGH (ref 0.0–0.2)
Indirect Bilirubin: 2.6 mg/dL — ABNORMAL HIGH (ref 0.3–0.9)
Total Bilirubin: 4.1 mg/dL — ABNORMAL HIGH (ref 0.3–1.2)
Total Protein: 8.7 g/dL — ABNORMAL HIGH (ref 6.5–8.1)

## 2019-02-06 LAB — TYPE AND SCREEN
ABO/RH(D): A POS
Antibody Screen: NEGATIVE

## 2019-02-06 LAB — GLUCOSE, CAPILLARY
Glucose-Capillary: 76 mg/dL (ref 70–99)
Glucose-Capillary: 87 mg/dL (ref 70–99)
Glucose-Capillary: 105 mg/dL — ABNORMAL HIGH (ref 70–99)

## 2019-02-06 LAB — PROTIME-INR
INR: 1.4 — ABNORMAL HIGH (ref 0.8–1.2)
Prothrombin Time: 17.3 seconds — ABNORMAL HIGH (ref 11.4–15.2)

## 2019-02-06 SURGERY — LAPAROTOMY, EXPLORATORY
Anesthesia: General | Site: Abdomen

## 2019-02-06 MED ORDER — 0.9 % SODIUM CHLORIDE (POUR BTL) OPTIME
TOPICAL | Status: DC | PRN
  Administered 2019-02-06: 13:00:00 2000 mL

## 2019-02-06 MED ORDER — MIDAZOLAM HCL 5 MG/5ML IJ SOLN
INTRAMUSCULAR | Status: DC | PRN
  Administered 2019-02-06: 2 mg via INTRAVENOUS

## 2019-02-06 MED ORDER — FENTANYL CITRATE (PF) 100 MCG/2ML IJ SOLN
INTRAMUSCULAR | Status: DC | PRN
  Administered 2019-02-06: 100 ug via INTRAVENOUS

## 2019-02-06 MED ORDER — SODIUM CHLORIDE 0.9 % IV SOLN
INTRAVENOUS | Status: DC | PRN
  Administered 2019-02-06 (×2): via INTRAVENOUS

## 2019-02-06 MED ORDER — DEXAMETHASONE SODIUM PHOSPHATE 10 MG/ML IJ SOLN
INTRAMUSCULAR | Status: DC | PRN
  Administered 2019-02-06: 5 mg via INTRAVENOUS

## 2019-02-06 MED ORDER — FENTANYL CITRATE (PF) 250 MCG/5ML IJ SOLN
INTRAMUSCULAR | Status: AC
  Filled 2019-02-06: qty 5

## 2019-02-06 MED ORDER — SODIUM CHLORIDE 0.9 % IV SOLN
INTRAVENOUS | Status: DC | PRN
  Administered 2019-02-06: 25 ug/min via INTRAVENOUS

## 2019-02-06 MED ORDER — BUPIVACAINE-EPINEPHRINE (PF) 0.5% -1:200000 IJ SOLN
INTRAMUSCULAR | Status: AC
  Filled 2019-02-06: qty 30

## 2019-02-06 MED ORDER — MORPHINE SULFATE (PF) 2 MG/ML IV SOLN: 2.0000 mg | INTRAVENOUS | Status: DC | PRN

## 2019-02-06 MED ORDER — METHOCARBAMOL 1000 MG/10ML IJ SOLN
500.0000 mg | Freq: Four times a day (QID) | INTRAVENOUS | Status: DC | PRN
  Filled 2019-02-06: qty 5

## 2019-02-06 MED ORDER — PROPOFOL 10 MG/ML IV BOLUS
INTRAVENOUS | Status: AC
  Filled 2019-02-06: qty 20

## 2019-02-06 MED ORDER — THIAMINE HCL 100 MG/ML IJ SOLN
100.0000 mg | Freq: Every day | INTRAMUSCULAR | Status: DC
  Administered 2019-02-06 – 2019-02-08 (×3): 100 mg via INTRAVENOUS
  Filled 2019-02-06 (×3): qty 2

## 2019-02-06 MED ORDER — MORPHINE SULFATE (PF) 4 MG/ML IV SOLN
4.0000 mg | INTRAVENOUS | Status: DC | PRN
  Administered 2019-02-06 – 2019-02-07 (×3): 4 mg via INTRAVENOUS
  Filled 2019-02-06 (×3): qty 1

## 2019-02-06 MED ORDER — MIDAZOLAM HCL 2 MG/2ML IJ SOLN
INTRAMUSCULAR | Status: AC
  Filled 2019-02-06: qty 2

## 2019-02-06 MED ORDER — ALBUMIN HUMAN 5 % IV SOLN
INTRAVENOUS | Status: DC | PRN
  Administered 2019-02-06 (×2): via INTRAVENOUS

## 2019-02-06 MED ORDER — IOHEXOL 300 MG/ML  SOLN
75.0000 mL | Freq: Once | INTRAMUSCULAR | Status: AC | PRN
  Administered 2019-02-06: 75 mL via INTRAVENOUS

## 2019-02-06 MED ORDER — SUGAMMADEX SODIUM 200 MG/2ML IV SOLN
INTRAVENOUS | Status: DC | PRN
  Administered 2019-02-06: 400 mg via INTRAVENOUS

## 2019-02-06 MED ORDER — PROPOFOL 10 MG/ML IV BOLUS
INTRAVENOUS | Status: DC | PRN
  Administered 2019-02-06: 200 mg via INTRAVENOUS

## 2019-02-06 MED ORDER — LIDOCAINE HCL 1 % IJ SOLN
INTRAMUSCULAR | Status: AC
  Filled 2019-02-06: qty 20

## 2019-02-06 MED ORDER — ONDANSETRON HCL 4 MG/2ML IJ SOLN
INTRAMUSCULAR | Status: DC | PRN
  Administered 2019-02-06: 4 mg via INTRAVENOUS

## 2019-02-06 MED ORDER — LACTULOSE 10 GM/15ML PO SOLN
10.0000 g | Freq: Two times a day (BID) | ORAL | Status: DC
  Administered 2019-02-07 – 2019-02-08 (×3): 10 g via ORAL
  Filled 2019-02-06 (×3): qty 15

## 2019-02-06 MED ORDER — LIDOCAINE HCL 1 % IJ SOLN
INTRAMUSCULAR | Status: DC | PRN
  Administered 2019-02-06: 10 mL

## 2019-02-06 MED ORDER — ROCURONIUM BROMIDE 50 MG/5ML IV SOSY
PREFILLED_SYRINGE | INTRAVENOUS | Status: DC | PRN
  Administered 2019-02-06: 100 mg via INTRAVENOUS

## 2019-02-06 MED ORDER — LIDOCAINE 2% (20 MG/ML) 5 ML SYRINGE
INTRAMUSCULAR | Status: DC | PRN
  Administered 2019-02-06: 60 mg via INTRAVENOUS

## 2019-02-06 SURGICAL SUPPLY — 58 items
BINDER ABDOMINAL 12 ML 46-62 (SOFTGOODS) ×4 IMPLANT
BLADE CLIPPER SURG (BLADE) IMPLANT
CANISTER SUCT 3000ML PPV (MISCELLANEOUS) ×4 IMPLANT
CHLORAPREP W/TINT 26ML (MISCELLANEOUS) ×4 IMPLANT
COVER SURGICAL LIGHT HANDLE (MISCELLANEOUS) ×4 IMPLANT
COVER WAND RF STERILE (DRAPES) IMPLANT
DERMABOND ADVANCED (GAUZE/BANDAGES/DRESSINGS) ×6
DERMABOND ADVANCED .7 DNX12 (GAUZE/BANDAGES/DRESSINGS) ×6 IMPLANT
DRAIN PENROSE 1/2X12 LTX STRL (WOUND CARE) IMPLANT
DRAPE LAPAROSCOPIC ABDOMINAL (DRAPES) ×4 IMPLANT
DRAPE LAPAROTOMY TRNSV 102X78 (DRAPE) ×4 IMPLANT
DRAPE WARM FLUID 44X44 (DRAPE) ×4 IMPLANT
DRSG OPSITE POSTOP 4X8 (GAUZE/BANDAGES/DRESSINGS) ×4 IMPLANT
ELECT BLADE 4.0 EZ CLEAN MEGAD (MISCELLANEOUS) ×4
ELECT BLADE 6.5 EXT (BLADE) IMPLANT
ELECT REM PT RETURN 9FT ADLT (ELECTROSURGICAL) ×4
ELECTRODE BLDE 4.0 EZ CLN MEGD (MISCELLANEOUS) ×2 IMPLANT
ELECTRODE REM PT RTRN 9FT ADLT (ELECTROSURGICAL) ×2 IMPLANT
GLOVE BIOGEL M STRL SZ7.5 (GLOVE) ×4 IMPLANT
GLOVE INDICATOR 8.0 STRL GRN (GLOVE) ×4 IMPLANT
GOWN STRL REUS W/ TWL LRG LVL3 (GOWN DISPOSABLE) ×6 IMPLANT
GOWN STRL REUS W/ TWL XL LVL3 (GOWN DISPOSABLE) ×2 IMPLANT
GOWN STRL REUS W/TWL LRG LVL3 (GOWN DISPOSABLE) ×6
GOWN STRL REUS W/TWL XL LVL3 (GOWN DISPOSABLE) ×2
HANDLE SUCTION POOLE (INSTRUMENTS) ×2 IMPLANT
KIT BASIN OR (CUSTOM PROCEDURE TRAY) ×4 IMPLANT
KIT TURNOVER KIT B (KITS) ×4 IMPLANT
LIGASURE IMPACT 36 18CM CVD LR (INSTRUMENTS) ×4 IMPLANT
NEEDLE HYPO 25GX1X1/2 BEV (NEEDLE) ×4 IMPLANT
NS IRRIG 1000ML POUR BTL (IV SOLUTION) ×8 IMPLANT
PACK GENERAL/GYN (CUSTOM PROCEDURE TRAY) ×4 IMPLANT
PAD ARMBOARD 7.5X6 YLW CONV (MISCELLANEOUS) ×4 IMPLANT
PENCIL SMOKE EVACUATOR (MISCELLANEOUS) ×4 IMPLANT
SLEEVE SUCTION 125 (MISCELLANEOUS) ×4 IMPLANT
SPECIMEN JAR LARGE (MISCELLANEOUS) IMPLANT
SPECIMEN JAR SMALL (MISCELLANEOUS) IMPLANT
SPONGE LAP 18X18 RF (DISPOSABLE) IMPLANT
STAPLER VISISTAT 35W (STAPLE) ×8 IMPLANT
SUCTION POOLE HANDLE (INSTRUMENTS) ×4
SUCTION POOLE TIP (SUCTIONS) ×4 IMPLANT
SUT ETHIBOND 0 MO6 C/R (SUTURE) ×4 IMPLANT
SUT MNCRL AB 4-0 PS2 18 (SUTURE) ×4 IMPLANT
SUT NOVA NAB DX-16 0-1 5-0 T12 (SUTURE) ×4 IMPLANT
SUT PDS AB 1 TP1 96 (SUTURE) ×8 IMPLANT
SUT SILK 2 0 SH CR/8 (SUTURE) ×4 IMPLANT
SUT SILK 2 0 TIES 10X30 (SUTURE) ×4 IMPLANT
SUT SILK 3 0 SH CR/8 (SUTURE) ×4 IMPLANT
SUT SILK 3 0 TIES 10X30 (SUTURE) ×4 IMPLANT
SUT VIC AB 2-0 CT1 27 (SUTURE) ×2
SUT VIC AB 2-0 CT1 TAPERPNT 27 (SUTURE) ×2 IMPLANT
SUT VIC AB 3-0 CT1 27 (SUTURE) ×2
SUT VIC AB 3-0 CT1 TAPERPNT 27 (SUTURE) ×2 IMPLANT
SUT VIC AB 3-0 SH 18 (SUTURE) ×4 IMPLANT
SYR CONTROL 10ML LL (SYRINGE) ×4 IMPLANT
TOWEL OR 17X24 6PK STRL BLUE (TOWEL DISPOSABLE) ×4 IMPLANT
TOWEL OR 17X26 10 PK STRL BLUE (TOWEL DISPOSABLE) ×4 IMPLANT
TRAY FOLEY MTR SLVR 16FR STAT (SET/KITS/TRAYS/PACK) ×4 IMPLANT
YANKAUER SUCT BULB TIP NO VENT (SUCTIONS) IMPLANT

## 2019-02-06 NOTE — Progress Notes (Signed)
[facility] Progress Note     Subjective: CC-  Patient reports persistent abdominal pain. Pain is periumbilical and slightly worse than yesterday. Emesis x1 this AM. States that he is passing some flatus. Last BM 5/10 AM.   WBC 5.7, VSS.   Objective: Vital signs in last 24 hours: Temp:  [98.4 F (36.9 C)-98.5 F (36.9 C)] 98.4 F (36.9 C) (05/11 0459) Pulse Rate:  [68-88] 88 (05/11 0459) Resp:  [14-18] 17 (05/11 0459) BP: (100-126)/(48-94) 100/48 (05/11 0459) SpO2:  [90 %-97 %] 95 % (05/11 0459) Weight:  [94.2 kg-94.7 kg] 94.2 kg (05/11 0613) Last BM Date: 02/05/19  Intake/Output from previous day: 05/10 0701 - 05/11 0700 In: 600 [IV Piggyback:600] Out: 200 [Urine:200] Intake/Output this shift: No intake/output data recorded.  PE: Gen:  Alert, NAD HEENT: EOM's intact, pupils equal and round, scleral icterus  Card:  RRR Pulm:  effort normal Abd: obese, soft, +ascites with fluid wave, multiple visibe abdominal wall veins, incarcerated umbilical hernia with mild tenderness/ not reducible, no peritonitis Ext:  Calves soft and nontender  Psych: A&Ox3  Skin: no rashes noted, warm and dry  Lab Results:  Recent Labs    02/05/19 1531 02/06/19 0612  WBC 5.1 5.7  HGB 7.9* 8.2*  HCT 24.7* 25.1*  PLT 143* 161   BMET Recent Labs    02/05/19 1531 02/06/19 0612  NA 134* 137  K 3.8 3.8  CL 109 105  CO2 18* 20*  GLUCOSE 97 101*  BUN 10 6  CREATININE 0.64 0.70  CALCIUM 9.3 9.7   PT/INR Recent Labs    02/05/19 1531 02/06/19 0612  LABPROT 16.9* 17.3*  INR 1.4* 1.4*   CMP     Component Value Date/Time   NA 137 02/06/2019 0612   K 3.8 02/06/2019 0612   CL 105 02/06/2019 0612   CO2 20 (L) 02/06/2019 0612   GLUCOSE 101 (H) 02/06/2019 0612   BUN 6 02/06/2019 0612   CREATININE 0.70 02/06/2019 0612   CALCIUM 9.7 02/06/2019 0612   PROT 8.7 (H) 02/06/2019 0612   ALBUMIN 2.3 (L) 02/06/2019 0612   AST 77 (H) 02/06/2019 0612   ALT 31 02/06/2019 0612    ALKPHOS 87 02/06/2019 0612   BILITOT 4.1 (H) 02/06/2019 0612   GFRNONAA >60 02/06/2019 0612   GFRAA >60 02/06/2019 0612   Lipase     Component Value Date/Time   LIPASE 37 01/21/2019 1648       Studies/Results: Ct Abdomen Pelvis W Contrast  Result Date: 02/05/2019 CLINICAL DATA:  Abdominal pain, umbilical hernia EXAM: CT ABDOMEN AND PELVIS WITH CONTRAST TECHNIQUE: Multidetector CT imaging of the abdomen and pelvis was performed using the standard protocol following bolus administration of intravenous contrast. CONTRAST:  100mL OMNIPAQUE IOHEXOL 300 MG/ML  SOLN COMPARISON:  01/21/2019 FINDINGS: Lower chest: Redemonstrated right pleural effusion with high density, likely loculated fluid collection of the dependent pleural space similar in size to prior examination, following drainage procedure. Associated atelectasis or consolidation of the right lower lobe. Small left pleural effusion. Hepatobiliary: Cirrhotic morphology of the liver. Tiny gallstone in the bladder. No gallbladder wall thickening, or biliary dilatation. Pancreas: Unremarkable. No pancreatic ductal dilatation or surrounding inflammatory changes. Spleen: Splenomegaly. Adrenals/Urinary Tract: Adrenal glands are unremarkable. Kidneys are normal, without renal calculi, focal lesion, or hydronephrosis. Bladder is unremarkable. Stomach/Bowel: Stomach is within normal limits. No evidence of bowel wall thickening, distention, or inflammatory changes.The small bowel is generally decompressed or air and fluid-filled without significant distention. Largest small  bowel loops measure 3.1 cm in caliber. Vascular/Lymphatic: No significant vascular findings are present. No enlarged abdominal or pelvic lymph nodes. Reproductive: No mass or other abnormality. Other: Redemonstrated small bowel and fluid containing umbilical hernia. Small volume ascites. Musculoskeletal: No acute or significant osseous findings. IMPRESSION: 1. Redemonstrated small bowel  and fluid containing umbilical hernia. The small bowel is generally decompressed or air and fluid-filled without significant distention. Lack of obstruction by CT does not exclude incarceration or strangulation if clinically suspected. 2.  Small volume ascites. 3.  Stigmata of cirrhosis and splenomegaly. 4. Redemonstrated right pleural effusion with high density, likely loculated fluid collection of the dependent pleural space similar in size to prior examination, following drainage procedure. Associated atelectasis or consolidation of the right lower lobe. Small left pleural effusion. Electronically Signed   By: Lauralyn Primes M.D.   On: 02/05/2019 18:04   Dg Chest Portable 1 View  Result Date: 02/05/2019 CLINICAL DATA:  38 year old male with abdominal pain for 1 day. Cirrhosis. Right lung pneumonia and loculated pleural effusion last month, pleural drain placed with CT guidance on 01/25/2019. EXAM: PORTABLE CHEST 1 VIEW COMPARISON:  Chest radiographs 01/31/2019 and earlier. FINDINGS: Portable AP semi upright view at 1524 hours. Stable veiling opacity in the right lower lung. Stable lung volumes and mediastinal contours. Visualized tracheal air column is within normal limits. No pneumothorax or pulmonary edema. No confluent opacity in the left lung. No acute osseous abnormality identified. IMPRESSION: Stable right pleural effusion since 01/31/2019. No new cardiopulmonary abnormality. Electronically Signed   By: Odessa Fleming M.D.   On: 02/05/2019 15:33    Anti-infectives: Anti-infectives (From admission, onward)   Start     Dose/Rate Route Frequency Ordered Stop   02/06/19 0600  vancomycin (VANCOCIN) 1,250 mg in sodium chloride 0.9 % 250 mL IVPB     1,250 mg 166.7 mL/hr over 90 Minutes Intravenous Every 12 hours 02/05/19 2335     02/06/19 0600  piperacillin-tazobactam (ZOSYN) IVPB 3.375 g     3.375 g 12.5 mL/hr over 240 Minutes Intravenous Every 8 hours 02/05/19 2335     02/05/19 2100  vancomycin (VANCOCIN)  2,000 mg in sodium chloride 0.9 % 500 mL IVPB     2,000 mg 250 mL/hr over 120 Minutes Intravenous  Once 02/05/19 2031 02/06/19 0020   02/05/19 2100  piperacillin-tazobactam (ZOSYN) IVPB 4.5 g  Status:  Discontinued     4.5 g 200 mL/hr over 30 Minutes Intravenous  Once 02/05/19 2031 02/05/19 2101   02/05/19 2100  piperacillin-tazobactam (ZOSYN) IVPB 3.375 g     3.375 g 100 mL/hr over 30 Minutes Intravenous  Once 02/05/19 2101 02/05/19 2156       Assessment/Plan Recent RLL e coli PNA with empyema s/p abx and chest tube Recent SBP s/p paracentesis 4/27 Ascites and Right pleural effusion - IR consult pending  Incarcerated umbilical hernia Alcoholic Marjo Bicker C cirrhosis - Patient with increased abdominal pain, n/v this AM. Planning OR this morning for exploratory laparotomy, umbilical hernia repair, possible bowel resection. Keep NPO and continue abx. GI consult pending for assistance with liver disease management  ID - zosyn/vancomycin 5/10>> FEN - IVF, NPO VTE - SCDs Foley - none Follow up - TBD   LOS: 1 day    Franne Forts , [facility] 02/06/2019, 8:40 AM Pager: [phone] Mon-Thurs 7:00 am-4:30 pm Fri 7:00 am -11:30 AM Sat-Sun 7:00 am-11:30 am

## 2019-02-06 NOTE — Consult Note (Signed)
[facility] Consult: 10:30 AM 02/06/2019  LOS: 1 day    Referring Provider: Dr Tyson Babinski  Primary Care Physician:  Patient, No Pcp Per  Primary Gastroenterologist:  Dr Jonette Eva in [place] but recently switched to Dr. Amado Coe at [facility] in [place]. Primary hematologist: ??   Reason for Consultation: With management of cirrhosis and patient headed to surgical repair of umbilical hernia.   HPI: Grant [name] is a 38 y.o. male.  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  Alcoholic hepatitis.  "complete" Portal venous thrombosis per ultrasound 06/2018.  Initially was not on anticoagulation but started on Xarelto in early to mid April, this was subsequently discontinued.  Microcytic/iron deficiency anemia.  Folate deficiency.  Thrombocytopenia.  Splenomegaly.  GI bleed ? 2016, EGD then "cauterized a vein or 2" 08/2015 EGD.  Medium sized distal esophageal varices without active bleeding or bleeding stigmata. Not had endoscopy since then.  Plans previously afoot to refer him to [facility] in [place] would for liver transplant consideration, however this has not happened yet.  He just recently enrolled with [place].  4/25 - 02/02/19 admission to [facility] hospital with hypoxia, acute respiratory failure, RLL PNA with empyema.   Tested negative for COVID 3 weeks prior to the admission and again on 01/21/2019.  Underwent CT guided percutaneous drainage of loculated pleural fluid.  Chest tube removed 01/29/2019.  Fluid grew pansensitive E. Coli.  SBP diagnosed from 2.2 liter paracentesis 4/27, 258 PMNs on cell count.  Problems included AKI, thrombocytopenia, chronic anemia.  Received 1 unit PRBCs for Hgb 6.9 (8 at discharge).  Usual Lasix and Aldactone were held and then restarted at lower dose (Lasix 40/day, Aldactone  50/day) due to kidney concerns.  Xarelto was discontinued.  Discharged on 21-day course of Augmentin  Admitted yesterday.  C/O increased pain and firm swelling in the region of his umbilical hernia for a couple of days.  CT shows ongoing small bowel and fluid containing umbilical hernia.  No overt obstruction.  Small volume ascites.  Cirrhosis, splenomegaly.  Persistent right pleural effusion with RLL consolidation, stable compared with previous post drainage imaging.  Small left pleural effusion. Felt nausea vomiting this morning Surgery is planning to take him to surgery today to repair incarcerated umbilical hernia.     Past Medical History:  Diagnosis Date   Ascites 2013   Avascular necrosis (HCC)    Campylobacter diarrhea 04/2016   bloody diarrhea.    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 Hepatitis B and C serologies negative 06/2016.      GERD (gastroesophageal reflux disease)    Hypomagnesemia 01/31/2018   Pneumonia    Splenomegaly 2013   Thrombocytopenia (HCC) 2013    Past Surgical History:  Procedure Laterality Date   ESOPHAGOGASTRODUODENOSCOPY  08/2015   [place], Dr. Teena Dunk medium sized varices in distal esophagus, evidence of red wale sign   IR PARACENTESIS  01/23/2019   PARACENTESIS  2013   In [place] at [facility].     Prior  to Admission medications   Medication Sig Start Date End Date Taking? Authorizing Provider  amoxicillin-clavulanate (AUGMENTIN) 875-125 MG tablet Take 1 tablet by mouth every 12 (twelve) hours for 21 days. 02/02/19 02/23/19 Yes Zigmund Daniel., MD  folic acid (FOLVITE) 1 MG tablet Take 1 tablet (1 mg total) by mouth daily. 05/10/16  Yes Vassie Loll, MD  furosemide (LASIX) 40 MG tablet Take 1 tablet (40 mg total) by mouth daily for 30 days. 02/02/19 03/04/19 Yes Zigmund Daniel., MD  lactulose (CHRONULAC) 10 GM/15ML solution Take 15 mLs (10 g total) by mouth daily. 02/03/19   Yes Zigmund Daniel., MD  Magnesium 250 MG TABS Take 250 mg by mouth daily.    Yes [provider]  Multiple Vitamin (MULTIVITAMIN) tablet Take 1 tablet by mouth daily.   Yes [provider]  omeprazole (PRILOSEC) 20 MG capsule TAKE ONE CAPSULE BY MOUTH TWICE DAILY BEFORE A MEAL Patient taking differently: Take 20 mg by mouth 2 (two) times daily before a meal.  10/11/18  Yes Gelene Mink, NP  propranolol (INDERAL) 10 MG tablet Take 1 tablet (10 mg total) by mouth 3 (three) times daily for 30 days. 02/02/19 03/04/19 Yes Zigmund Daniel., MD  spironolactone (ALDACTONE) 50 MG tablet Take 1 tablet (50 mg total) by mouth daily for 30 days. 02/03/19 03/05/19 Yes Zigmund Daniel., MD  thiamine 100 MG tablet Take 1 tablet (100 mg total) by mouth daily. 05/10/16  Yes Vassie Loll, MD    Scheduled Meds:  thiamine injection  100 mg Intravenous Daily   Infusions:  piperacillin-tazobactam (ZOSYN)  IV 3.375 g (02/06/19 0932)   vancomycin 1,250 mg (02/06/19 0537)   PRN Meds: iohexol, morphine injection   Allergies as of 02/05/2019   (No Known Allergies)    Family History  Problem Relation Age of Onset   Diabetes Father    Hypertension Father    Colon polyps Father 2   Diabetes Other    Cancer Neg Hx    Colon cancer Neg Hx    Liver disease Neg Hx     Social History   Socioeconomic History   Marital status: Single    Spouse name: Not on file   Number of children: 0   Years of education: 2 to 3 yrs of colleg   Highest education level: High school graduate  Occupational History   Occupation: unemployed    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   Drug use: No   Sexual activity: Not Currently  Lifestyle   Physical activity:    Days per week: 0 days    Minutes per session: 0 min   Stress: Only a little  Relationships   Social connections:    Talks on phone: Once a week    Gets together: Once a week    Attends religious service: Never  Active member of club or organization: No    Attends meetings of clubs or organizations: Never    Relationship status: Never married   Intimate partner violence:    Fear of current or ex partner: No    Emotionally abused: No    Physically abused: No    Forced sexual activity: No  Other Topics Concern   Not on file  Social History Narrative   Had nearly 1 year of college credit gained in high school AP classes.  Stayed in college for about 2 years, has about 3 yrs of college credits.     REVIEW OF SYSTEMS: Constitutional: Weakness, fatigue. ENT:  No nose bleeds Pulm: Productive cough, occasionally sees scant amount of blood in sputum. CV:  No palpitations, no LE edema.  No chest pain GU:  No hematuria, no frequency.   No amber-colored urine. GI: Generally appetite is good and generally does not have nausea vomiting but developed nausea vomiting today. Heme: While he was on Xarelto he bruised very easily. Transfusions:  Received 1 PRBC on 01/31/2019.   Feraheme infusions on 02/03/2018, 03/11/2018 and 03/18/2018.  3 PRBCs in 02/2018. He also received Feraheme on 07/14/2018 Neuro:  No headaches, no peripheral tingling or numbness Derm:  No itching, no rash or sores.  Endocrine:  No sweats or chills.  No polyuria or dysuria Immunization:  Not queried.  Pneumovax 01/2018.  tdap 2011 Travel:  None beyond local counties in last few months.    PHYSICAL EXAM: Vital signs in last 24 hours: Vitals:   02/06/19 0459  02/06/19 0853  BP: (!) 100/48 (!) 102/52  Pulse: 88 90  Resp: 17 18  Temp: 98.4 F (36.9 C) 98 F (36.7 C)  SpO2: 95% 96%   Wt Readings from Last 3 Encounters:  02/06/19 94.2 kg  02/02/19 94.6 kg  11/04/18 100.5 kg    General: Pale, chronically ill appearing, malnourished appealing WM who is very slightly jaundiced. Head: Facial asymmetry or swelling.  No signs of head trauma. Eyes: Slight scleral icterus.  No conjunctival pallor. Ears: Not hard of hearing Nose: No discharge or congestion Mouth: Good dentition.  Oral mucosa pink, moist, clear.  Tongue midline. Neck: No JVD, no masses, no thyromegaly. Lungs: No labored breathing, no dyspnea.  Lungs clear.  Scant amount of purulent sputum with streaks of blood coughed up a couple of times during the interview. Heart: RRR.  No MRG.  S1, S2 present Abdomen: Obese.  Protuberant, violaceous discolored umbilical hernia.  Area locally around the hernia is firm and tender.  No guarding or rebound.  Bowel sounds active..   Rectal: Deferred Musc/Skeltl: No joint redness, swelling, deformity. Extremities: Slight, nonpitting lower extremity edema. Neurologic: Oriented x3.  No asterixis.  Some clonus in the feet.  No resting tremor.  Good historian.  No obvious deficits, moves all 4 limbs, strength not tested. Skin: Pale, sallow, question mild jaundice.  Prominent veining on his back and some on his legs.  Remnants of bruises on his posterior trunk. Tattoos: None observed. Nodes: No cervical adenopathy Psych: Pleasant, cooperative, calm.  Fluid speech.  Intake/Output from previous day: 05/10 0701 - 05/11 0700 In: 600 [IV Piggyback:600] Out: 200 [Urine:200] Intake/Output this shift: Total I/O In: 0  Out: 350 [Urine:350]  LAB RESULTS: Recent Labs    02/05/19 1531 02/06/19 0612  WBC 5.1 5.7  HGB 7.9* 8.2*  HCT 24.7* 25.1*  PLT 143* 161   BMET Lab Results  Component Value Date   NA  137 02/06/2019   NA 134 (L) 02/05/2019   NA  134 (L) 02/01/2019   K 3.8 02/06/2019   K 3.8 02/05/2019   K 4.6 02/01/2019   CL 105 02/06/2019   CL 109 02/05/2019   CL 107 02/01/2019   CO2 20 (L) 02/06/2019   CO2 18 (L) 02/05/2019   CO2 16 (L) 02/01/2019   GLUCOSE 101 (H) 02/06/2019   GLUCOSE 97 02/05/2019   GLUCOSE 91 02/01/2019   BUN 6 02/06/2019   BUN 10 02/05/2019   BUN 23 (H) 02/01/2019   CREATININE 0.70 02/06/2019   CREATININE 0.64 02/05/2019   CREATININE 0.95 02/01/2019   CALCIUM 9.7 02/06/2019   CALCIUM 9.3 02/05/2019   CALCIUM 8.9 02/01/2019   LFT Recent Labs    02/05/19 1531 02/06/19 0612  PROT 8.0 8.7*  ALBUMIN 2.4* 2.3*  AST 78* 77*  ALT 31 31  ALKPHOS 87 87  BILITOT 3.9* 4.1*  BILIDIR  --  1.5*  IBILI  --  2.6*   PT/INR Lab Results  Component Value Date   INR 1.4 (H) 02/06/2019   INR 1.4 (H) 02/05/2019   INR 1.4 (H) 02/02/2019   Lipase     Component Value Date/Time   LIPASE 37 01/21/2019 1648     RADIOLOGY STUDIES: Ct Abdomen Pelvis W Contrast  Result Date: 02/05/2019 CLINICAL DATA:  Abdominal pain, umbilical hernia EXAM: CT ABDOMEN AND PELVIS WITH CONTRAST TECHNIQUE: Multidetector CT imaging of the abdomen and pelvis was performed using the standard protocol following bolus administration of intravenous contrast. CONTRAST:  OMNIPAQUE IOHEXOL 300 MG/ML  SOLN COMPARISON:  01/21/2019 FINDINGS: Lower chest: Redemonstrated right pleural effusion with high density, likely loculated fluid collection of the dependent pleural space similar in size to prior examination, following drainage procedure. Associated atelectasis or consolidation of the right lower lobe. Small left pleural effusion. Hepatobiliary: Cirrhotic morphology of the liver. Tiny gallstone in the bladder. No gallbladder wall thickening, or biliary dilatation. Pancreas: Unremarkable. No pancreatic ductal dilatation or surrounding inflammatory changes. Spleen: Splenomegaly. Adrenals/Urinary Tract: Adrenal glands are unremarkable.  Kidneys are normal, without renal calculi, focal lesion, or hydronephrosis. Bladder is unremarkable. Stomach/Bowel: Stomach is within normal limits. No evidence of bowel wall thickening, distention, or inflammatory changes.The small bowel is generally decompressed or air and fluid-filled without significant distention. Largest small bowel loops measure 3.1 cm in caliber. Vascular/Lymphatic: No significant vascular findings are present. No enlarged abdominal or pelvic lymph nodes. Reproductive: No mass or other abnormality. Other: Redemonstrated small bowel and fluid containing umbilical hernia. Small volume ascites. Musculoskeletal: No acute or significant osseous findings. IMPRESSION: 1. Redemonstrated small bowel and fluid containing umbilical hernia. The small bowel is generally decompressed or air and fluid-filled without significant distention. Lack of obstruction by CT does not exclude incarceration or strangulation if clinically suspected. 2.  Small volume ascites. 3.  Stigmata of cirrhosis and splenomegaly. 4. Redemonstrated right pleural effusion with high density, likely loculated fluid collection of the dependent pleural space similar in size to prior examination, following drainage procedure. Associated atelectasis or consolidation of the right lower lobe. Small left pleural effusion. Electronically Signed   By: Lauralyn Primes M.D.   On: 02/05/2019 18:04   Dg Chest Portable 1 View  Result Date: 02/05/2019 CLINICAL DATA:  38 year old male with abdominal pain for 1 day. Cirrhosis. Right lung pneumonia and loculated pleural effusion last month, pleural drain placed with CT guidance on 01/25/2019. EXAM: PORTABLE CHEST 1 VIEW COMPARISON:  Chest radiographs 01/31/2019 and earlier. FINDINGS:  Portable AP semi upright view at 1524 hours. Stable veiling opacity in the right lower lung. Stable lung volumes and mediastinal contours. Visualized tracheal air column is within normal limits. No pneumothorax or  pulmonary edema. No confluent opacity in the left lung. No acute osseous abnormality identified. IMPRESSION: Stable right pleural effusion since 01/31/2019. No new cardiopulmonary abnormality. Electronically Signed   By: Odessa Fleming M.D.   On: 02/05/2019 15:33     IMPRESSION:   *   Alcoholic cirrhosis of the liver. Meld 17.  No alcohol for about 1 year.  *     Umbilical hernia.  Surgery planning hernia repair this afternoon.  *    SBP diagnosed on 4/27 paracentesis, cell count.  At recent discharge was taking Augmentin for complicated pneumonia but plan was to continue some form of SBP prophylaxis after that.  *      Complicated right sided pneumonia requiring CT-guided drainage and temporary chest tube during hospitalization ending 02/02/2019.  E. coli and discharged on 21-day course of Augmentin.  CT chest today, reading pending but on abdominal films chest which is show ongoing, stable right pleural effusion and empyema along with a small left pleural effusion.  *     Portal vein thrombosis.  Not clear if this was the indication for the Xarelto started in early 12/2018.   However Xarelto discontinued as of last/recent hospitalization.  *      Splenomegaly, chronic thrombocytopenia.  *     Esophageal varices.  Reports, but no records to confirm, of variceal band ligation around 2016.   Last EGD was in 2016.  *     Hypo Albuminemia, looks somewhat malnourished.  *      Acute on chronic macrocytic anemia.  Last anemia labs were in 09/2018 showing low iron, low TIBC, ferritin 536, folate and vitamin B12 normal     PLAN:     *    Start lactulose 15 ml BID tomorrow morning.  Continue antibiotics.  Currently on Zosyn and vancomycin.  *    Fibrinogen level in the morning   Jennye Moccasin  02/06/2019, 10:30 AM Phone [phone]

## 2019-02-06 NOTE — H&P (Signed)
History and Physical    Grant [name] UJW:[id number] DOB: [birth year] DOA: 02/05/2019  PCP: Patient, No Pcp Per  Patient coming from: Home.  Chief Complaint: Abdominal pain.  HPI: Grant [name] is a 38 y.o. male with history of alcoholic liver cirrhosis was recently admitted and discharged 3 days ago after being treated for right-sided empyema with chest tube which has been removed eventually and also had SBP to be on Augmentin for 3 more weeks presents with worsening abdominal pain around his umbilical hernia for last 24 hours.  Patient states since yesterday morning patient is umbilical hernia increased in size and increasing in pain.  No vomiting or diarrhea.  Denies any fever chills.  Has been some cough which has not increased from previous.  Denies chest pain.  ED Course: In the ER patient was afebrile.  On exam patient has umbilical hernia which is tender to touch and looks incarcerated.  CT abdomen pelvis was done which shows mild ascites and also redemonstration of his right-sided pleural effusion which looks loculated with consolidation.  Umbilical hernia with the bowel seen.  Dr. Dwain Sarna general surgeon was consulted patient admitted for incarcerated hernia and will need further work-up.  Labs revealed sodium of 134 creatinine 1.6 hemoglobin 7.9 potassium 3.8 ALT 31 albumin 2.4 platelets 143.  Review of Systems: As per HPI, rest all negative.   Past Medical History:  Diagnosis Date   Ascites 2013   Avascular necrosis (HCC)    Campylobacter diarrhea 04/2016   bloody diarrhea.    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 Hepatitis B and C serologies negative 06/2016.      GERD (gastroesophageal reflux disease)    Hypomagnesemia 01/31/2018   Pneumonia    Splenomegaly 2013   Thrombocytopenia (HCC) 2013    Past Surgical History:  Procedure Laterality Date   ESOPHAGOGASTRODUODENOSCOPY  08/2015   [place], Dr. 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      reports that he has never smoked. He has never used smokeless tobacco. He reports previous alcohol use of about 20.0 standard drinks of alcohol per week. He reports that he does not use drugs.  No Known Allergies  Family History  Problem Relation Age of Onset   Diabetes Father    Hypertension Father    Colon polyps Father 68   Diabetes Other    Cancer Neg Hx    Colon cancer Neg Hx    Liver disease Neg Hx     Prior to Admission medications   Medication Sig Start Date End Date Taking? Authorizing Provider  amoxicillin-clavulanate (AUGMENTIN) 875-125 MG tablet Take 1 tablet by mouth every 12 (twelve) hours for 21 days. 02/02/19 02/23/19 Yes Zigmund Daniel., MD  folic acid (FOLVITE) 1 MG tablet Take 1 tablet (1 mg total) by mouth daily. 05/10/16  Yes Vassie Loll, MD  furosemide (LASIX) 40 MG tablet Take 1 tablet (40 mg total) by mouth daily for 30 days. 02/02/19 03/04/19 Yes Zigmund Daniel., MD  lactulose (CHRONULAC) 10 GM/15ML solution Take 15 mLs (10 g total) by mouth daily. 02/03/19  Yes Zigmund Daniel., MD  Magnesium 250 MG TABS Take 250 mg by mouth daily.    Yes [provider]  Multiple Vitamin (MULTIVITAMIN) tablet Take 1 tablet by mouth daily.  Yes [provider]  omeprazole (PRILOSEC) 20 MG capsule TAKE ONE CAPSULE BY MOUTH TWICE DAILY BEFORE A MEAL Patient taking differently: Take 20 mg by mouth 2 (two) times daily before a meal.  10/11/18  Yes Gelene Mink, NP  propranolol (INDERAL) 10 MG tablet Take 1 tablet (10 mg total) by mouth 3 (three) times daily for 30 days. 02/02/19 03/04/19 Yes Zigmund Daniel., MD  spironolactone (ALDACTONE) 50 MG tablet Take 1 tablet (50 mg total) by mouth daily for 30 days. 02/03/19 03/05/19 Yes Zigmund Daniel., MD  thiamine 100 MG tablet Take 1 tablet  (100 mg total) by mouth daily. 05/10/16  Yes Vassie Loll, MD    Physical Exam: Vitals:   02/06/19 0020 02/06/19 0024 02/06/19 0459 02/06/19 0613  BP: 123/66  (!) 100/48   Pulse: 71  88   Resp: 18  17   Temp: 98.5 F (36.9 C)  98.4 F (36.9 C)   TempSrc: Oral  Oral   SpO2: 97%  95%   Weight:  94.7 kg  94.2 kg  Height:          Constitutional: Moderately built and nourished. Vitals:   02/06/19 0020 02/06/19 0024 02/06/19 0459 02/06/19 0613  BP: 123/66  (!) 100/48   Pulse: 71  88   Resp: 18  17   Temp: 98.5 F (36.9 C)  98.4 F (36.9 C)   TempSrc: Oral  Oral   SpO2: 97%  95%   Weight:  94.7 kg  94.2 kg  Height:       Eyes: Anicteric no pallor. ENMT: No discharge from the ears eyes nose or mouth. Neck: No mass felt.  No neck rigidity. Respiratory: No rhonchi or crepitations. Cardiovascular: S1-S2 heard. Abdomen: Distended abdomen with umbilical hernia tender to touch. Musculoskeletal: Mild edema. Skin: No rash. Neurologic: Alert awake oriented to time place and person.  Moves all extremities. Psychiatric: Appears normal.  Normal affect.   Labs on Admission: I have personally reviewed following labs and imaging studies  CBC: Recent Labs  Lab 01/31/19 0517 02/01/19 0457 02/01/19 1447 02/02/19 0239 02/02/19 1136 02/05/19 1531 02/06/19 0612  WBC 11.8* 8.9  --  9.8  --  5.1 5.7  NEUTROABS  --   --   --   --   --  3.2 3.8  HGB 6.9* 7.3* 8.0* 7.1* 8.0* 7.9* 8.2*  HCT 21.3* 22.0* 24.2* 22.2* 24.1* 24.7* 25.1*  MCV 104.9* 103.8*  --  105.2*  --  106.5* 104.6*  PLT 122* 132*  --  133*  --  143* 161   Basic Metabolic Panel: Recent Labs  Lab 01/31/19 0517 02/01/19 0457 02/01/19 1609 02/02/19 0239 02/05/19 1531  NA 133* 134* 134*  --  134*  K 4.5 4.3 4.6  --  3.8  CL 107 110 107  --  109  CO2 15* 15* 16*  --  18*  GLUCOSE 90 85 91  --  97  BUN 36* 29* 23*  --  10  CREATININE 1.30* 1.17 0.95  --  0.64  CALCIUM 8.8* 8.7* 8.9  --  9.3  MG  --   --   --   1.6*  --    GFR: Estimated Creatinine Clearance: 145.7 mL/min (by C-G formula based on SCr of 0.64 mg/dL). Liver Function Tests: Recent Labs  Lab 01/31/19 0517 02/01/19 1609 02/05/19 1531  AST 65* 85* 78*  ALT ALKPHOS 87 96 87  BILITOT  4.8* 3.8* 3.9*  PROT 7.9 7.7 8.0  ALBUMIN 2.3* 2.2* 2.4*   No results for input(s): LIPASE, AMYLASE in the last 168 hours. No results for input(s): AMMONIA in the last 168 hours. Coagulation Profile: Recent Labs  Lab 02/02/19 0239 02/05/19 1531  INR 1.4* 1.4*   Cardiac Enzymes: No results for input(s): CKTOTAL, CKMB, CKMBINDEX, TROPONINI in the last 168 hours. BNP (last 3 results) No results for input(s): PROBNP in the last 8760 hours. HbA1C: No results for input(s): HGBA1C in the last 72 hours. CBG: Recent Labs  Lab 02/06/19 0142 02/06/19 0546  GLUCAP 76 87   Lipid Profile: No results for input(s): CHOL, HDL, LDLCALC, TRIG, CHOLHDL, LDLDIRECT in the last 72 hours. Thyroid Function Tests: No results for input(s): TSH, T4TOTAL, FREET4, T3FREE, THYROIDAB in the last 72 hours. Anemia Panel: No results for input(s): VITAMINB12, FOLATE, FERRITIN, TIBC, IRON, RETICCTPCT in the last 72 hours. Urine analysis:    Component Value Date/Time   COLORURINE YELLOW 02/05/2019 1904   APPEARANCEUR CLEAR 02/05/2019 1904   LABSPEC 1.023 02/05/2019 1904   PHURINE 5.0 02/05/2019 1904   GLUCOSEU NEGATIVE 02/05/2019 1904   HGBUR LARGE (A) 02/05/2019 1904   BILIRUBINUR NEGATIVE 02/05/2019 1904   KETONESUR NEGATIVE 02/05/2019 1904   PROTEINUR NEGATIVE 02/05/2019 1904   NITRITE NEGATIVE 02/05/2019 1904   LEUKOCYTESUR NEGATIVE 02/05/2019 1904   Sepsis Labs: @LABRCNTIP (procalcitonin:4,lacticidven:4) ) Recent Results (from the past 240 hour(s))  SARS Coronavirus 2 (CEPHEID - Performed in [facility] hospital lab), Hosp Order     Status: None   Collection Time: 02/05/19  9:09 PM  Result Value Ref Range Status   SARS Coronavirus 2 NEGATIVE  NEGATIVE Final    Comment: (NOTE) If result is NEGATIVE SARS-CoV-2 target nucleic acids are NOT DETECTED. The SARS-CoV-2 RNA is generally detectable in upper and lower  respiratory specimens during the acute phase of infection. The lowest  concentration of SARS-CoV-2 viral copies this assay can detect is 250  copies / mL. A negative result does not preclude SARS-CoV-2 infection  and should not be used as the sole basis for treatment or other  patient management decisions.  A negative result may occur with  improper specimen collection / handling, submission of specimen other  than nasopharyngeal swab, presence of viral mutation(s) within the  areas targeted by this assay, and inadequate number of viral copies  (<250 copies / mL). A negative result must be combined with clinical  observations, patient history, and epidemiological information. If result is POSITIVE SARS-CoV-2 target nucleic acids are DETECTED. The SARS-CoV-2 RNA is generally detectable in upper and lower  respiratory specimens dur ing the acute phase of infection.  Positive  results are indicative of active infection with SARS-CoV-2.  Clinical  correlation with patient history and other diagnostic information is  necessary to determine patient infection status.  Positive results do  not rule out bacterial infection or co-infection with other viruses. If result is PRESUMPTIVE POSTIVE SARS-CoV-2 nucleic acids MAY BE PRESENT.   A presumptive positive result was obtained on the submitted specimen  and confirmed on repeat testing.  While 2019 novel coronavirus  (SARS-CoV-2) nucleic acids may be present in the submitted sample  additional confirmatory testing may be necessary for epidemiological  and / or clinical management purposes  to differentiate between  SARS-CoV-2 and other Sarbecovirus currently known to infect humans.  If clinically indicated additional testing with an alternate test  methodology [id number]) is  advised. The SARS-CoV-2 RNA is generally  detectable  in upper and lower respiratory sp ecimens during the acute  phase of infection. The expected result is Negative. Fact Sheet for Patients:  BoilerBrush.com.cyhttps://www.fda.gov/media/136312/download Fact Sheet for Healthcare Providers: https://pope.com/https://www.fda.gov/media/136313/download This test is not yet approved or cleared by the [place] FDA and has been authorized for detection and/or diagnosis of SARS-CoV-2 by FDA under an Emergency Use Authorization (EUA).  This EUA will remain in effect (meaning this test can be used) for the duration of the COVID-19 declaration under Section 564(b)(1) of the Act, 21 U.S.C. section 360bbb-3(b)(1), unless the authorization is terminated or revoked sooner. Performed at [facility], [address] [place], [place] [address]      Radiological Exams on Admission: Ct Abdomen Pelvis W Contrast  Result Date: 02/05/2019 CLINICAL DATA:  Abdominal pain, umbilical hernia EXAM: CT ABDOMEN AND PELVIS WITH CONTRAST TECHNIQUE: Multidetector CT imaging of the abdomen and pelvis was performed using the standard protocol following bolus administration of intravenous contrast. CONTRAST:  100mL OMNIPAQUE IOHEXOL 300 MG/ML  SOLN COMPARISON:  01/21/2019 FINDINGS: Lower chest: Redemonstrated right pleural effusion with high density, likely loculated fluid collection of the dependent pleural space similar in size to prior examination, following drainage procedure. Associated atelectasis or consolidation of the right lower lobe. Small left pleural effusion. Hepatobiliary: Cirrhotic morphology of the liver. Tiny gallstone in the bladder. No gallbladder wall thickening, or biliary dilatation. Pancreas: Unremarkable. No pancreatic ductal dilatation or surrounding inflammatory changes. Spleen: Splenomegaly. Adrenals/Urinary Tract: Adrenal glands are unremarkable. Kidneys are normal, without renal calculi, focal lesion, or hydronephrosis.  Bladder is unremarkable. Stomach/Bowel: Stomach is within normal limits. No evidence of bowel wall thickening, distention, or inflammatory changes.The small bowel is generally decompressed or air and fluid-filled without significant distention. Largest small bowel loops measure 3.1 cm in caliber. Vascular/Lymphatic: No significant vascular findings are present. No enlarged abdominal or pelvic lymph nodes. Reproductive: No mass or other abnormality. Other: Redemonstrated small bowel and fluid containing umbilical hernia. Small volume ascites. Musculoskeletal: No acute or significant osseous findings. IMPRESSION: 1. Redemonstrated small bowel and fluid containing umbilical hernia. The small bowel is generally decompressed or air and fluid-filled without significant distention. Lack of obstruction by CT does not exclude incarceration or strangulation if clinically suspected. 2.  Small volume ascites. 3.  Stigmata of cirrhosis and splenomegaly. 4. Redemonstrated right pleural effusion with high density, likely loculated fluid collection of the dependent pleural space similar in size to prior examination, following drainage procedure. Associated atelectasis or consolidation of the right lower lobe. Small left pleural effusion. Electronically Signed   By: Lauralyn PrimesAlex  Bibbey M.D.   On: 02/05/2019 18:04   Dg Chest Portable 1 View  Result Date: 02/05/2019 CLINICAL DATA:  38 year old male with abdominal pain for 1 day. Cirrhosis. Right lung pneumonia and loculated pleural effusion last month, pleural drain placed with CT guidance on 01/25/2019. EXAM: PORTABLE CHEST 1 VIEW COMPARISON:  Chest radiographs 01/31/2019 and earlier. FINDINGS: Portable AP semi upright view at 1524 hours. Stable veiling opacity in the right lower lung. Stable lung volumes and mediastinal contours. Visualized tracheal air column is within normal limits. No pneumothorax or pulmonary edema. No confluent opacity in the left lung. No acute osseous  abnormality identified. IMPRESSION: Stable right pleural effusion since 01/31/2019. No new cardiopulmonary abnormality. Electronically Signed   By: Odessa FlemingH  Hall M.D.   On: 02/05/2019 15:33     Assessment/Plan Principal Problem:   Obstructed umbilical hernia Active Problems:   Alcoholic cirrhosis of liver with ascites (HCC)   Symptomatic anemia  Cirrhosis (HCC)   Portal vein thrombosis   Hernia, umbilical    1. Incarcerated umbilical hernia -appreciate general surgery consult.  Patient is kept n.p.o.  May consult gastroenterologist in the morning for further recommendations as requested by general surgery. 2. Redemonstration of right pleural effusion loculated -for which patient is on empiric antibiotics.  IR consult requested.  Follow blood cultures. 3. Recent admission patient had SBP again CAT scan showed some ascites.  IR consult requested for paracentesis. 4. Alcoholic liver cirrhosis -patient states he has not any alcohol for last 1 year.  On thiamine.  Take Lasix and spironolactone which I am holding for now since patient is n.p.o. 5. Chronic anemia follow CBC. 6. Previous history of portal vein thrombosis used to be on Xarelto which has been hold since last admission since CAT scan does not show any further evidence.   DVT prophylaxis: SCDs. Code Status: Full code. Family Communication: Discussed with patient. Disposition Plan: Home. Consults called: General surgery. Admission status: Inpatient.   Eduard Clos MD [facility] Pager [phone].  If 7PM-7AM, please contact night-coverage [email or web address] Password [facility]  02/06/2019, 6:57 AM

## 2019-02-06 NOTE — Anesthesia Procedure Notes (Signed)
Procedure Name: Intubation Date/Time: 02/06/2019 12:58 PM Performed by: Oletta Lamas, CRNA Pre-anesthesia Checklist: Patient identified, Emergency Drugs available, Suction available and Patient being monitored Patient Re-evaluated:Patient Re-evaluated prior to induction Oxygen Delivery Method: Circle System Utilized Preoxygenation: Pre-oxygenation with 100% oxygen Induction Type: IV induction Ventilation: Mask ventilation without difficulty Laryngoscope Size: Mac and 4 Grade View: Grade I Tube type: Oral Tube size: 7.5 mm Number of attempts: 1 Airway Equipment and Method: Stylet Placement Confirmation: ETT inserted through vocal cords under direct vision,  positive ETCO2 and breath sounds checked- equal and bilateral Secured at: 22 cm Tube secured with: Tape Dental Injury: Teeth and Oropharynx as per pre-operative assessment

## 2019-02-06 NOTE — Progress Notes (Signed)
PROGRESS NOTE  Grant [name] ZOX:[id number] DOB: [birth year] DOA: 02/05/2019 PCP: Patient, No Pcp Per   LOS: 1 day    Same day  note  Brief narrative: Grant [name] is a 38 y.o. male with history of alcoholic liver cirrhosis was recently admitted and discharged 3 days ago after being treated for right-sided empyema with chest tube which has been removed eventually and also had SBP to be on Augmentin for 3 more weeks presented with worsening abdominal pain around his umbilical hernia for last 24 hours.  Patient states since yesterday morning patient is umbilical hernia increased in size and increasing in pain.  No vomiting or diarrhea.  Denies any fever chills.  Has been some cough which has not increased from previous.  Denies chest pain.  ED Course: In the ER patient was afebrile.  On exam patient has umbilical hernia which is tender to touch and looks incarcerated.  CT abdomen pelvis was done which shows mild ascites and also redemonstration of his right-sided pleural effusion which looks loculated with consolidation.  Umbilical hernia with the bowel seen.  Dr. Dwain Sarna general surgeon was consulted patient admitted for incarcerated hernia and will need further work-up.  Labs revealed sodium of 134 creatinine 1.6 hemoglobin 7.9 potassium 3.8 ALT 31 albumin 2.4 platelets 143.  Subjective: Patient complains of intermittent severe abdominal pain.  1 episode of vomiting.  Has not had a bowel movement.  Assessment/Plan:  Principal Problem:   Obstructed umbilical hernia Active Problems:   Alcoholic cirrhosis of liver with ascites (HCC)   Symptomatic anemia   Cirrhosis (HCC)   Portal vein thrombosis   Hernia, umbilical  Incarcerated umbilical hernia -General surgery on board.  N.p.o.  IV fluids antiemetics IV narcotics for pain control.  Patient complains of severe abdominal pain which appears to be colicky in nature.  Spoke with general surgery at bedside.  Request GI consultation due to  advanced liver failure, cirrhosis of liver and history of SB.  Spoke with GI for consultation.  Right-sided loculated pleural effusion empyema.  Currently on vancomycin and Zosyn.  Recent history of SBP again CAT scan showed some ascites.  IR consult requested for paracentesis.  Was taking Augmentin as outpatient.  Currently on vancomycin and Zosyn  Alcoholic liver cirrhosis -patient states he has not any alcohol for last 1 year.  continue thiamine. Hold Lasix and spironolactone for now   Chronic anemia follow CBC.  Previous history of portal vein thrombosis used to be on Xarelto which has been hold since last admission since CAT scan does not show any further evidence.  VTE Prophylaxis: SCD  Code Status: Full code  Family Communication: None  Disposition Plan: Home in 3 to 4 days.  We will follow GI and surgical recommendation.  Consultants:  General surgery  GI consulted  Procedures:  None so far  Antibiotics: Anti-infectives (From admission, onward)   Start     Dose/Rate Route Frequency Ordered Stop   02/06/19 0600  vancomycin (VANCOCIN) 1,250 mg in sodium chloride 0.9 % 250 mL IVPB     1,250 mg 166.7 mL/hr over 90 Minutes Intravenous Every 12 hours 02/05/19 2335     02/06/19 0600  piperacillin-tazobactam (ZOSYN) IVPB 3.375 g     3.375 g 12.5 mL/hr over 240 Minutes Intravenous Every 8 hours 02/05/19 2335     02/05/19 2100  vancomycin (VANCOCIN) 2,000 mg in sodium chloride 0.9 % 500 mL IVPB     2,000 mg 250 mL/hr over 120 Minutes Intravenous  Once  02/05/19 2031 02/06/19 0020   02/05/19 2100  piperacillin-tazobactam (ZOSYN) IVPB 4.5 g  Status:  Discontinued     4.5 g 200 mL/hr over 30 Minutes Intravenous  Once 02/05/19 2031 02/05/19 2101   02/05/19 2100  piperacillin-tazobactam (ZOSYN) IVPB 3.375 g     3.375 g 100 mL/hr over 30 Minutes Intravenous  Once 02/05/19 2101 02/05/19 2156      Objective: Vitals:   02/06/19 0020 02/06/19 0459  BP: 123/66 (!) 100/48    Pulse: 71 88  Resp: 18 17  Temp: 98.5 F (36.9 C) 98.4 F (36.9 C)  SpO2: 97% 95%    Intake/Output Summary (Last 24 hours) at 02/06/2019 0851 Last data filed at 02/06/2019 0446 Gross per 24 hour  Intake 600 ml  Output 200 ml  Net 400 ml   Filed Weights   02/05/19 1507 02/06/19 0024 02/06/19 0613  Weight: 94.6 kg 94.7 kg 94.2 kg   Body mass index is 29.79 kg/m.   Physical Exam: GENERAL: Patient is alert awake and oriented.  In mild distress due to pain HENT: No scleral pallor or icterus. Pupils equally reactive to light. Oral mucosa is moist.  Mild pallor noted NECK: is supple, no palpable thyroid enlargement. CHEST: Clear to auscultation. No crackles or wheezes. Non tender on palpation. Diminished breath sounds bilaterally. CVS: S1 and S2 heard, no murmur. Regular rate and rhythm. No pericardial rub. ABDOMEN: Soft, non-reducible umbilical hernia with mildly distended abdomen, bowel sounds are present. No palpable hepato-splenomegaly. EXTREMITIES: Trace edema CNS: Cranial nerves are intact. No focal motor or sensory deficits. SKIN: warm and dry without rashes.  Data Review: I have personally reviewed the following laboratory data and studies,  CBC: Recent Labs  Lab 01/31/19 0517 02/01/19 0457 02/01/19 1447 02/02/19 0239 02/02/19 1136 02/05/19 1531 02/06/19 0612  WBC 11.8* 8.9  --  9.8  --  5.1 5.7  NEUTROABS  --   --   --   --   --  3.2 3.8  HGB 6.9* 7.3* 8.0* 7.1* 8.0* 7.9* 8.2*  HCT 21.3* 22.0* 24.2* 22.2* 24.1* 24.7* 25.1*  MCV 104.9* 103.8*  --  105.2*  --  106.5* 104.6*  PLT 122* 132*  --  133*  --  143* 161   Basic Metabolic Panel: Recent Labs  Lab 01/31/19 0517 02/01/19 0457 02/01/19 1609 02/02/19 0239 02/05/19 1531 02/06/19 0612  NA 133* 134* 134*  --  134* 137  K 4.5 4.3 4.6  --  3.8 3.8  CL 107 110 107  --  109 105  CO2 15* 15* 16*  --  18* 20*  GLUCOSE 90 85 91  --  97 101*  BUN 36* 29* 23*  --  10 6  CREATININE 1.30* 1.17 0.95  --  0.64  0.70  CALCIUM 8.8* 8.7* 8.9  --  9.3 9.7  MG  --   --   --  1.6*  --   --    Liver Function Tests: Recent Labs  Lab 01/31/19 0517 02/01/19 1609 02/05/19 1531 02/06/19 0612  AST 65* 85* 78* 77*  ALT ALKPHOS 87 96 87 87  BILITOT 4.8* 3.8* 3.9* 4.1*  PROT 7.9 7.7 8.0 8.7*  ALBUMIN 2.3* 2.2* 2.4* 2.3*   No results for input(s): LIPASE, AMYLASE in the last 168 hours. No results for input(s): AMMONIA in the last 168 hours. Cardiac Enzymes: No results for input(s): CKTOTAL, CKMB, CKMBINDEX, TROPONINI in the last 168 hours. BNP (last 3  results) Recent Labs    01/21/19 1648  BNP 318.0*    ProBNP (last 3 results) No results for input(s): PROBNP in the last 8760 hours.  CBG: Recent Labs  Lab 02/06/19 0142 02/06/19 0546  GLUCAP 76 87   Recent Results (from the past 240 hour(s))  SARS Coronavirus 2 (CEPHEID - Performed in [facility] hospital lab), Hosp Order     Status: None   Collection Time: 02/05/19  9:09 PM  Result Value Ref Range Status   SARS Coronavirus 2 NEGATIVE NEGATIVE Final    Comment: (NOTE) If result is NEGATIVE SARS-CoV-2 target nucleic acids are NOT DETECTED. The SARS-CoV-2 RNA is generally detectable in upper and lower  respiratory specimens during the acute phase of infection. The lowest  concentration of SARS-CoV-2 viral copies this assay can detect is 250  copies / mL. A negative result does not preclude SARS-CoV-2 infection  and should not be used as the sole basis for treatment or other  patient management decisions.  A negative result may occur with  improper specimen collection / handling, submission of specimen other  than nasopharyngeal swab, presence of viral mutation(s) within the  areas targeted by this assay, and inadequate number of viral copies  (<250 copies / mL). A negative result must be combined with clinical  observations, patient history, and epidemiological information. If result is POSITIVE SARS-CoV-2 target nucleic  acids are DETECTED. The SARS-CoV-2 RNA is generally detectable in upper and lower  respiratory specimens dur ing the acute phase of infection.  Positive  results are indicative of active infection with SARS-CoV-2.  Clinical  correlation with patient history and other diagnostic information is  necessary to determine patient infection status.  Positive results do  not rule out bacterial infection or co-infection with other viruses. If result is PRESUMPTIVE POSTIVE SARS-CoV-2 nucleic acids MAY BE PRESENT.   A presumptive positive result was obtained on the submitted specimen  and confirmed on repeat testing.  While 2019 novel coronavirus  (SARS-CoV-2) nucleic acids may be present in the submitted sample  additional confirmatory testing may be necessary for epidemiological  and / or clinical management purposes  to differentiate between  SARS-CoV-2 and other Sarbecovirus currently known to infect humans.  If clinically indicated additional testing with an alternate test  methodology [phone]) is advised. The SARS-CoV-2 RNA is generally  detectable in upper and lower respiratory sp ecimens during the acute  phase of infection. The expected result is Negative. Fact Sheet for Patients:  BoilerBrush.com.cy Fact Sheet for Healthcare Providers: https://pope.com/ This test is not yet approved or cleared by the [place] FDA and has been authorized for detection and/or diagnosis of SARS-CoV-2 by FDA under an Emergency Use Authorization (EUA).  This EUA will remain in effect (meaning this test can be used) for the duration of the COVID-19 declaration under Section 564(b)(1) of the Act, 21 U.S.C. section 360bbb-3(b)(1), unless the authorization is terminated or revoked sooner. Performed at [facility], [address] [place], [place] [address]      Studies: Ct Abdomen Pelvis W Contrast  Result Date: 02/05/2019 CLINICAL DATA:   Abdominal pain, umbilical hernia EXAM: CT ABDOMEN AND PELVIS WITH CONTRAST TECHNIQUE: Multidetector CT imaging of the abdomen and pelvis was performed using the standard protocol following bolus administration of intravenous contrast. CONTRAST:  OMNIPAQUE IOHEXOL 300 MG/ML  SOLN COMPARISON:  01/21/2019 FINDINGS: Lower chest: Redemonstrated right pleural effusion with high density, likely loculated fluid collection of the dependent pleural space similar  in size to prior examination, following drainage procedure. Associated atelectasis or consolidation of the right lower lobe. Small left pleural effusion. Hepatobiliary: Cirrhotic morphology of the liver. Tiny gallstone in the bladder. No gallbladder wall thickening, or biliary dilatation. Pancreas: Unremarkable. No pancreatic ductal dilatation or surrounding inflammatory changes. Spleen: Splenomegaly. Adrenals/Urinary Tract: Adrenal glands are unremarkable. Kidneys are normal, without renal calculi, focal lesion, or hydronephrosis. Bladder is unremarkable. Stomach/Bowel: Stomach is within normal limits. No evidence of bowel wall thickening, distention, or inflammatory changes.The small bowel is generally decompressed or air and fluid-filled without significant distention. Largest small bowel loops measure 3.1 cm in caliber. Vascular/Lymphatic: No significant vascular findings are present. No enlarged abdominal or pelvic lymph nodes. Reproductive: No mass or other abnormality. Other: Redemonstrated small bowel and fluid containing umbilical hernia. Small volume ascites. Musculoskeletal: No acute or significant osseous findings. IMPRESSION: 1. Redemonstrated small bowel and fluid containing umbilical hernia. The small bowel is generally decompressed or air and fluid-filled without significant distention. Lack of obstruction by CT does not exclude incarceration or strangulation if clinically suspected. 2.  Small volume ascites. 3.  Stigmata of cirrhosis and  splenomegaly. 4. Redemonstrated right pleural effusion with high density, likely loculated fluid collection of the dependent pleural space similar in size to prior examination, following drainage procedure. Associated atelectasis or consolidation of the right lower lobe. Small left pleural effusion. Electronically Signed   By: Lauralyn PrimesAlex  Bibbey M.D.   On: 02/05/2019 18:04   Dg Chest Portable 1 View  Result Date: 02/05/2019 CLINICAL DATA:  38 year old male with abdominal pain for 1 day. Cirrhosis. Right lung pneumonia and loculated pleural effusion last month, pleural drain placed with CT guidance on 01/25/2019. EXAM: PORTABLE CHEST 1 VIEW COMPARISON:  Chest radiographs 01/31/2019 and earlier. FINDINGS: Portable AP semi upright view at 1524 hours. Stable veiling opacity in the right lower lung. Stable lung volumes and mediastinal contours. Visualized tracheal air column is within normal limits. No pneumothorax or pulmonary edema. No confluent opacity in the left lung. No acute osseous abnormality identified. IMPRESSION: Stable right pleural effusion since 01/31/2019. No new cardiopulmonary abnormality. Electronically Signed   By: Odessa FlemingH  Hall M.D.   On: 02/05/2019 15:33    Scheduled Meds:  thiamine injection  100 mg Intravenous Daily    Continuous Infusions:  piperacillin-tazobactam (ZOSYN)  IV     vancomycin 1,250 mg (02/06/19 0537)   No charge  Joycelyn DasLaxman Debarah Mccumbers, MD  [facility] 02/06/2019

## 2019-02-06 NOTE — Transfer of Care (Signed)
Immediate Anesthesia Transfer of Care Note  Patient: Grant [name]  Procedure(s) Performed: EXPLORATORY LAPAROTOMY WITH PRIMARY CLOSURE OF UMBLICAL HERNIA (N/A Abdomen)  Patient Location: PACU  Anesthesia Type:General  Level of Consciousness: awake, alert  and patient cooperative  Airway & Oxygen Therapy: Patient Spontanous Breathing and Patient connected to face mask oxygen  Post-op Assessment: Report given to RN and Post -op Vital signs reviewed and stable  Post vital signs: Reviewed and stable  Last Vitals:  Vitals Value Taken Time  BP 114/62 02/06/2019  2:32 PM  Temp 36.4 C 02/06/2019  2:30 PM  Pulse 90 02/06/2019  2:40 PM  Resp 22 02/06/2019  2:40 PM  SpO2 87 % 02/06/2019  2:40 PM  Vitals shown include unvalidated device data.  Last Pain:  Vitals:   02/06/19 1430  TempSrc:   PainSc: 0-No pain      Patients Stated Pain Goal: 2 (02/06/19 4174)  Complications: No apparent anesthesia complications

## 2019-02-06 NOTE — Anesthesia Postprocedure Evaluation (Signed)
Anesthesia Post Note  Patient: [name]  Procedure(s) Performed: EXPLORATORY LAPAROTOMY WITH PRIMARY CLOSURE OF UMBLICAL HERNIA (N/A Abdomen)     Patient location during evaluation: PACU Anesthesia Type: General Level of consciousness: awake and alert Pain management: pain level controlled Vital Signs Assessment: post-procedure vital signs reviewed and stable Respiratory status: spontaneous breathing, nonlabored ventilation, respiratory function stable and patient connected to nasal cannula oxygen Cardiovascular status: blood pressure returned to baseline and stable Postop Assessment: no apparent nausea or vomiting Anesthetic complications: no    Last Vitals:  Vitals:   02/06/19 1502 02/06/19 1544  BP: 122/65 121/90  Pulse: 85 88  Resp: (!) 21   Temp:    SpO2: (!) 87% 93%    Last Pain:  Vitals:   02/06/19 1502  TempSrc:   PainSc: Asleep                 Lucretia Kern

## 2019-02-06 NOTE — Progress Notes (Signed)
Pt is back from PACU. Pt is alert and oriented x4, denies any pain. Pt is on 2L of O2 via [place], respirations even and unlabored. New IV in the left wrist placed in the OR. SCDs are on, abd binder in place and honeycomb dressing mid abdomen. No distress noted, call light within reach and bed in lowest position.  Leonia Reeves, RN

## 2019-02-06 NOTE — Progress Notes (Signed)
Patient known to IR from recent admission during which a percutaneous chest tube was placed for empyema with loculated effusion. Tube was placed 4/29 and removed 5/3.  At the time of removal he had acute bleeding which resolved with pressure.  Patient was otherwise stable and discharged home on antibiotics with known residual fluid.   Patient returned to [facility] ED with acute abdominal pain. He was found to have an incarcerated hernia is currently in the OR for urgent repair.   IR consulted for assessment of his pleural effusion.  CT Chest obtained this AM and reviewed by Dr. Grace Isaac who notes collection is overall unchanged and may contain old products, possibly of prior hemorrhage. If respiratory concerns present, Dr. Grace Isaac recommends TCTS consultation.    Loyce Dys, MS RD PA-C

## 2019-02-06 NOTE — Anesthesia Preprocedure Evaluation (Addendum)
Anesthesia Evaluation  Patient identified by MRN, date of birth, ID band Patient awake    Reviewed: Allergy & Precautions, NPO status , Patient's Chart, lab work & pertinent test results  History of Anesthesia Complications Negative for: history of anesthetic complications  Airway Mallampati: III  TM Distance: >3 FB Neck ROM: Full  Mouth opening: Limited Mouth Opening  Dental no notable dental hx.    Pulmonary neg pulmonary ROS,    Pulmonary exam normal        Cardiovascular negative cardio ROS Normal cardiovascular exam     Neuro/Psych negative neurological ROS  negative psych ROS   GI/Hepatic GERD  ,(+) Cirrhosis   ascites  substance abuse  alcohol use,   Endo/Other  negative endocrine ROS  Renal/GU negative Renal ROS  negative genitourinary   Musculoskeletal negative musculoskeletal ROS (+)   Abdominal   Peds  Hematology  (+) Blood dyscrasia, anemia ,   Anesthesia Other Findings   Reproductive/Obstetrics                            Anesthesia Physical Anesthesia Plan  ASA: III and emergent  Anesthesia Plan: General   Post-op Pain Management:    Induction: Intravenous, Rapid sequence and Cricoid pressure planned  PONV Risk Score and Plan: 3 and Ondansetron, Dexamethasone, Midazolam and Treatment may vary due to age or medical condition  Airway Management Planned: Oral ETT  Additional Equipment: None  Intra-op Plan:   Post-operative Plan: Extubation in OR  Informed Consent: I have reviewed the patients History and Physical, chart, labs and discussed the procedure including the risks, benefits and alternatives for the proposed anesthesia with the patient or authorized representative who has indicated his/her understanding and acceptance.     Dental advisory given  Plan Discussed with:   Anesthesia Plan Comments:        Anesthesia Quick Evaluation

## 2019-02-06 NOTE — Op Note (Signed)
02/06/2019  2:24 PM  PATIENT:  Grant [name]  38 y.o. male  Patient Care Team: Patient, No Pcp Per as PCP - General (General Practice) Fields, Darleene CleaverSandi L, MD as Consulting Physician (Gastroenterology)  PRE-OPERATIVE DIAGNOSIS: 1. 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 Exploratory laparotomy 2. Primary repair of ventral hernia  SURGEON:  Stephanie Couphristopher M. Cliffton AstersWhite, MD  ASSISTANT: Carlena BjornstadBrooke Meuth PA-C  ANESTHESIA:   general  COUNTS:  Sponge, needle and instrument counts were reported correct x2 at the conclusion of the operation.  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 Incarcerated supraumbilical hernia containing ~30 cm loop of small bowel. Bowel was edematous and congested with redness - clearly viable and erythema washed out by conclusion of procedure. Mesentery viable. 1.7 L of bilious ascites evacuated during procedure. Some of hernia sac was removed with specimen (umbilicus) but some left in situ due to size of hernia sac varices and bleeding risk with removal. Repaired primarily given recent SBP.  DISPOSITION: PACU in satisfactory condition  INDICATION: Mr. 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 He was discharged approximately 3d prior and was on active treatment for SBP as well. He recently had developed ascitic leak from umbilicus but this managed to close on its own after managing with ?ostomy appliance. He was seen this morning, now with nausea/vomiting and ongoing pain at hernia site. Options were discussed and he opted to pursue surgery. Please refer to notes elsewhere for details regarding this discussion.  DESCRIPTION: The patient was identified in  preop holding and taken to the OR where he was placed on the operating room table. SCDs were placed. General endotracheal anesthesia was induced without difficulty. A foley catheter was placed by nursing. He was then prepped and draped in the usual sterile fashion. A surgical timeout was performed indicating the correct patient, procedure, positioning and need for preoperative antibiotics.   Above the level of his supraumbilical hernia and defect, skin incision was made.  This was carried down to subcutaneous tissue electrocautery.  The fascia just above the hernia sac was identified and incised sharply.  Coker's were used to grasp the fascia.  The peritoneum was then incised with Metzenbaum scissors.  Just cephalad to this, the fascia was opened.  The hernia defect was palpable and contained a loop of small bowel.  I was able to insinuate a finger within this defect and opened the fascia down to my finger.  This then opened up the defect and allowed evaluation of the contents.  The hernia was found to contain congested segment of small bowel.  This subsequently pinked up nicely and was clearly viable.  This was palpated and noted to be widely patent without stricturing.  The associated mesentery was clearly viable with a pulse.  The hernia sac contained large varices within it.  The umbilical skin was somewhat denuded likely from his recent spontaneous drainage and application of stoma appliance.  For this reason, the decision was made to resect the umbilicus.  The hernia sac associated with the superior portion of the umbilicus was divided using a LigaSure given the varices present. This was passed off as specimen and contained a likely thrombosis of one of his varices.  Some  of the hernia sac was intentionally left in situ given the varices within it and risk for bleeding.  The small bowel was reexamined and noted to be clearly viable and appeared healthy.  Given his recent treatment for spontaneous bacterial  peritonitis (SBP), the decision was made not to repair this with mesh.  The abdominal wall was reinspected and noted to be hemostatic.  There was no active bleeding.  During the course of the procedure, 1.7 L of bile tinged clear ascites had been evacuated.  Attention was turned to closure.  All counts were reported correct.  The fascia was closed with 2 running #1 looped PDS sutures and was able to be done so with minimal tension.  Intermittently, additional interrupted #1 Novafil sutures were placed.  After the fascia was closed, the midline was palpated and noted to be completely closed without any gaps in the closure.  The subcutaneous tissue was irrigated and hemostasis confirmed.  This was then closed in layers with deep dermal 3-0 Vicryl sutures.  The skin was then closed with staples.  The staples and midline skin were coated with a copious amount of Dermabond.  After this had dried, a honeycomb dressing was placed.  An abdominal binder was placed.  He was then awakened from general anesthesia, extubated, and transferred to a stretcher for transport to PACU in satisfactory condition.

## 2019-02-07 ENCOUNTER — Encounter (HOSPITAL_COMMUNITY): Payer: Self-pay | Admitting: Surgery

## 2019-02-07 LAB — COMPREHENSIVE METABOLIC PANEL
ALT: 26 U/L (ref 0–44)
AST: 59 U/L — ABNORMAL HIGH (ref 15–41)
Albumin: 2.4 g/dL — ABNORMAL LOW (ref 3.5–5.0)
Alkaline Phosphatase: 72 U/L (ref 38–126)
Anion gap: 12 (ref 5–15)
BUN: 11 mg/dL (ref 6–20)
CO2: 17 mmol/L — ABNORMAL LOW (ref 22–32)
Calcium: 9.1 mg/dL (ref 8.9–10.3)
Chloride: 109 mmol/L (ref 98–111)
Creatinine, Ser: 0.82 mg/dL (ref 0.61–1.24)
GFR calc Af Amer: >60 mL/min (ref >60)
GFR calc non Af Amer: >60 mL/min (ref >60)
Glucose, Bld: 125 mg/dL — ABNORMAL HIGH (ref 70–99)
Potassium: 4.2 mmol/L (ref 3.5–5.1)
Sodium: 138 mmol/L (ref 135–145)
Total Bilirubin: 3.5 mg/dL — ABNORMAL HIGH (ref 0.3–1.2)
Total Protein: 7.5 g/dL (ref 6.5–8.1)

## 2019-02-07 LAB — PROTIME-INR
INR: 1.5 — ABNORMAL HIGH (ref 0.8–1.2)
Prothrombin Time: 17.8 seconds — ABNORMAL HIGH (ref 11.4–15.2)

## 2019-02-07 LAB — CBC
HCT: 22.9 % — ABNORMAL LOW (ref 39.0–52.0)
Hemoglobin: 7.3 g/dL — ABNORMAL LOW (ref 13.0–17.0)
MCH: 33.8 pg (ref 26.0–34.0)
MCHC: 31.9 g/dL (ref 30.0–36.0)
MCV: 106 fL — ABNORMAL HIGH (ref 80.0–100.0)
Platelets: 126 10*3/uL — ABNORMAL LOW (ref 150–400)
RBC: 2.16 MIL/uL — ABNORMAL LOW (ref 4.22–5.81)
RDW: 17.3 % — ABNORMAL HIGH (ref 11.5–15.5)
WBC: 5.3 10*3/uL (ref 4.0–10.5)
nRBC: 0 % (ref 0.0–0.2)

## 2019-02-07 LAB — VANCOMYCIN, TROUGH: Vancomycin Tr: 25 ug/mL (ref 15–20)

## 2019-02-07 LAB — VANCOMYCIN, PEAK: Vancomycin Pk: 37 ug/mL (ref 30–40)

## 2019-02-07 LAB — GLUCOSE, CAPILLARY
Glucose-Capillary: 113 mg/dL — ABNORMAL HIGH (ref 70–99)
Glucose-Capillary: 116 mg/dL — ABNORMAL HIGH (ref 70–99)
Glucose-Capillary: 130 mg/dL — ABNORMAL HIGH (ref 70–99)
Glucose-Capillary: 130 mg/dL — ABNORMAL HIGH (ref 70–99)

## 2019-02-07 LAB — FIBRINOGEN: Fibrinogen: 433 mg/dL (ref 210–475)

## 2019-02-07 MED ORDER — VANCOMYCIN HCL IN DEXTROSE 750-5 MG/150ML-% IV SOLN
750.0000 mg | Freq: Two times a day (BID) | INTRAVENOUS | Status: DC
  Administered 2019-02-07 – 2019-02-08 (×2): 750 mg via INTRAVENOUS
  Filled 2019-02-07 (×4): qty 150

## 2019-02-07 MED ORDER — PROPRANOLOL HCL 20 MG PO TABS
20.0000 mg | ORAL_TABLET | Freq: Two times a day (BID) | ORAL | Status: DC
  Administered 2019-02-07 – 2019-02-08 (×3): 20 mg via ORAL
  Filled 2019-02-07 (×3): qty 1

## 2019-02-07 MED ORDER — PANTOPRAZOLE SODIUM 40 MG PO TBEC
40.0000 mg | DELAYED_RELEASE_TABLET | Freq: Every day | ORAL | Status: DC
  Administered 2019-02-07 – 2019-02-08 (×2): 40 mg via ORAL
  Filled 2019-02-07 (×2): qty 1

## 2019-02-07 NOTE — Progress Notes (Signed)
Pharmacy Antibiotic Note  Grant [name] is a 38 y.o. male admitted on 02/05/2019 with abdominal pain and sepsis, now s/p incarcerated ventral hernia repair on 5/11.  Pharmacy has been consulted for vancomycin and Zosyn dosing.   He had a recent admit for empyema, had a chest tube from 4/29 >> 5/3 and fluid grew E coli (pan sensitive except resistant to Bactrim). He was sent home on Augmentin.  Vancomycin peak 37, trough 25. Steady-state AUC is supratherapeutic at 895.8 mcg/ml. Renal function at baseline. He is afebrile, WBC wnl.  Plan: Decrease vancomycin to 750 mg IV q12h, Goal AUC 400-550, Expected AUC: 537.2 Continue Zosyn 3.375 g IV q8h to be infused over 4 hours Monitor renal function, clinical progress, C/S   Height: 5\' 10"  (177.8 cm) Weight: 207 lb 10.8 oz (94.2 kg) IBW/kg (Calculated) : 73  Temp (24hrs), Avg:98 F (36.7 C), Min:97.9 F (36.6 C), Max:98.2 F (36.8 C)  Recent Labs  Lab 02/01/19 0457 02/01/19 1609 02/02/19 0239 02/05/19 1531 02/06/19 0612 02/07/19 0300 02/07/19 1131 02/07/19 1630  WBC 8.9  --  9.8 5.1 5.7 5.3  --   --   CREATININE 1.17 0.95  --  0.64 0.70 0.82  --   --   LATICACIDVEN  --   --   --  0.9  --   --   --   --   VANCOTROUGH  --   --   --   --   --   --   --  25*  VANCOPEAK  --   --   --   --   --   --  37  --     Estimated Creatinine Clearance: 142.2 mL/min (by C-G formula based on SCr of 0.82 mg/dL).    No Known Allergies  Antimicrobials this admission: Vanc 5/10 >> Zosyn 5/10 >>   Dose adjustments this admission:   Microbiology results: 5/10 BCx x 2 >> 5/10 COVID negative  Thank you for allowing pharmacy to be a part of this patient's care.  Loura Back, PharmD, BCPS Clinical Pharmacist Clinical phone for 02/07/2019 until 8p is [phone] 02/07/2019 5:49 PM  **Pharmacist phone directory can now be found on [email or web address] listed under [facility]**

## 2019-02-07 NOTE — Progress Notes (Signed)
CRITICAL VALUE ALERT  Critical Value: Vanc level: 25  Date & Time Notied: 02/07/19 1730  Provider Notified: Pokhrel  Orders Received/Actions taken: IV Vanc D/C'd.

## 2019-02-07 NOTE — Progress Notes (Signed)
Patient ID: Grant [name], male   DOB: [birth year], 38 y.o.   MRN: [id number]

## 2019-02-07 NOTE — Progress Notes (Signed)
Daily Rounding Note  02/07/2019, 10:21 AM  LOS: 2 days   SUBJECTIVE:   Chief complaint:   Incarcerated umbilical hernia.  Cirrhosis.  Feels better.  Abdomen much more comfortable after hernia reduction/repair.  No stool or flatus yet.  Tolerating sips, diet advanced to clear trays for lunch.  Overall feels good.  Walking in hall  OBJECTIVE:         Vital signs in last 24 hours:    Temp:  [97.6 F (36.4 C)-98.2 F (36.8 C)] 97.9 F (36.6 C) (05/12 1000) Pulse Rate:  [74-88] 74 (05/12 1000) Resp:  [18-21] 18 (05/12 1000) BP: (114-123)/(62-90) 123/67 (05/12 1000) SpO2:  [87 %-94 %] 94 % (05/12 1006) Weight:  [94.2 kg] 94.2 kg (05/11 2113) Last BM Date: 02/05/19 Filed Weights   02/06/19 0024 02/06/19 0613 02/06/19 2113  Weight: 94.7 kg 94.2 kg 94.2 kg   General: looks pale, somewhat chronically unwell.  Comfortable.     Heart: RRR Chest: clear bil.  No labored breathing or cough Abdomen: soft, minor tenderness to moderate pressure.  Honeycombed bandage in place over umbilicus and abd binder in place.    Extremities: no CCE.   Neuro/Psych:  Alert, fluid speech, no confusion or gross deficits.    Intake/Output from previous day: 05/11 0701 - 05/12 0700 In: 1500 [I.V.:1000; IV Piggyback:500] Out: 1025 [Urine:975; Blood:50]  Intake/Output this shift: No intake/output data recorded.  Lab Results: Recent Labs    02/05/19 1531 02/06/19 0612 02/07/19 0300  WBC 5.1 5.7 5.3  HGB 7.9* 8.2* 7.3*  HCT 24.7* 25.1* 22.9*  PLT 143* 161 126*   BMET Recent Labs    02/05/19 1531 02/06/19 0612 02/07/19 0300  NA 134* 137 138  K 3.8 3.8 4.2  CL 109 105 109  CO2 18* 20* 17*  GLUCOSE 97 101* 125*  BUN 10 6 11   CREATININE 0.64 0.70 0.82  CALCIUM 9.3 9.7 9.1   LFT Recent Labs    02/05/19 1531 02/06/19 0612 02/07/19 0300  PROT 8.0 8.7* 7.5  ALBUMIN 2.4* 2.3* 2.4*  AST 78* 77* 59*  ALT 31 31 26   ALKPHOS 87 87 72   BILITOT 3.9* 4.1* 3.5*  BILIDIR  --  1.5*  --   IBILI  --  2.6*  --    PT/INR Recent Labs    02/06/19 0612 02/07/19 0300  LABPROT 17.3* 17.8*  INR 1.4* 1.5*   Hepatitis Panel No results for input(s): HEPBSAG, HCVAB, HEPAIGM, HEPBIGM in the last 72 hours.  Studies/Results: Ct Chest W Contrast  Result Date: 02/06/2019 CLINICAL DATA:  Follow-up pneumonia EXAM: CT CHEST WITH CONTRAST TECHNIQUE: Multidetector CT imaging of the chest was performed during intravenous contrast administration. CONTRAST:  75mL OMNIPAQUE IOHEXOL 300 MG/ML  SOLN COMPARISON:  CT chest, 01/21/2019, CT-guided drain placement, 01/25/2019 FINDINGS: Cardiovascular: No significant vascular findings. Normal heart size. No pericardial effusion. Mediastinum/Nodes: No enlarged mediastinal, hilar, or axillary lymph nodes. Thyroid gland, trachea, and esophagus demonstrate no significant findings. Lungs/Pleura: Small left, moderate right pleural effusions with associated atelectasis or consolidation. There remains a low-attenuation collection in the dependent right pleural space, likely with adjacent pleural thickening. This is essentially unchanged in size compared to examination dated 01/21/2019, measuring approximately 6.7 x 4.8 cm. Upper Abdomen: No acute abnormality. Ascites, cirrhotic morphology of the liver, and partially imaged splenomegaly. Musculoskeletal: No chest wall mass or suspicious bone lesions identified. IMPRESSION: 1. Small left, moderate right pleural effusions with associated atelectasis  or consolidation. There remains a low-attenuation collection in the dependent right pleural space, likely with adjacent pleural thickening. This is essentially unchanged in size compared to examination dated 01/21/2019, measuring approximately 6.7 x 4.8 cm. Differential considerations include chronic, loculated pleural fluid, abscess, or hematoma. 2.  Stigmata of cirrhosis in the partially included upper abdomen. Electronically  Signed   By: Lauralyn Primes M.D.   On: 02/06/2019 10:52   Ct Abdomen Pelvis W Contrast  Result Date: 02/05/2019 CLINICAL DATA:  Abdominal pain, umbilical hernia EXAM: CT ABDOMEN AND PELVIS WITH CONTRAST TECHNIQUE: Multidetector CT imaging of the abdomen and pelvis was performed using the standard protocol following bolus administration of intravenous contrast. CONTRAST:  OMNIPAQUE IOHEXOL 300 MG/ML  SOLN COMPARISON:  01/21/2019 FINDINGS: Lower chest: Redemonstrated right pleural effusion with high density, likely loculated fluid collection of the dependent pleural space similar in size to prior examination, following drainage procedure. Associated atelectasis or consolidation of the right lower lobe. Small left pleural effusion. Hepatobiliary: Cirrhotic morphology of the liver. Tiny gallstone in the bladder. No gallbladder wall thickening, or biliary dilatation. Pancreas: Unremarkable. No pancreatic ductal dilatation or surrounding inflammatory changes. Spleen: Splenomegaly. Adrenals/Urinary Tract: Adrenal glands are unremarkable. Kidneys are normal, without renal calculi, focal lesion, or hydronephrosis. Bladder is unremarkable. Stomach/Bowel: Stomach is within normal limits. No evidence of bowel wall thickening, distention, or inflammatory changes.The small bowel is generally decompressed or air and fluid-filled without significant distention. Largest small bowel loops measure 3.1 cm in caliber. Vascular/Lymphatic: No significant vascular findings are present. No enlarged abdominal or pelvic lymph nodes. Reproductive: No mass or other abnormality. Other: Redemonstrated small bowel and fluid containing umbilical hernia. Small volume ascites. Musculoskeletal: No acute or significant osseous findings. IMPRESSION: 1. Redemonstrated small bowel and fluid containing umbilical hernia. The small bowel is generally decompressed or air and fluid-filled without significant distention. Lack of obstruction by CT does  not exclude incarceration or strangulation if clinically suspected. 2.  Small volume ascites. 3.  Stigmata of cirrhosis and splenomegaly. 4. Redemonstrated right pleural effusion with high density, likely loculated fluid collection of the dependent pleural space similar in size to prior examination, following drainage procedure. Associated atelectasis or consolidation of the right lower lobe. Small left pleural effusion. Electronically Signed   By: Lauralyn Primes M.D.   On: 02/05/2019 18:04   Dg Chest Portable 1 View  Result Date: 02/05/2019 CLINICAL DATA:  38 year old male with abdominal pain for 1 day. Cirrhosis. Right lung pneumonia and loculated pleural effusion last month, pleural drain placed with CT guidance on 01/25/2019. EXAM: PORTABLE CHEST 1 VIEW COMPARISON:  Chest radiographs 01/31/2019 and earlier. FINDINGS: Portable AP semi upright view at 1524 hours. Stable veiling opacity in the right lower lung. Stable lung volumes and mediastinal contours. Visualized tracheal air column is within normal limits. No pneumothorax or pulmonary edema. No confluent opacity in the left lung. No acute osseous abnormality identified. IMPRESSION: Stable right pleural effusion since 01/31/2019. No new cardiopulmonary abnormality. Electronically Signed   By: Odessa Fleming M.D.   On: 02/05/2019 15:33   Scheduled Meds:  lactulose  10 g Oral BID   thiamine injection  100 mg Intravenous Daily   Continuous Infusions:  methocarbamol (ROBAXIN) IV     piperacillin-tazobactam (ZOSYN)  IV 3.375 g (02/07/19 0649)   vancomycin 1,250 mg (02/07/19 0507)   PRN Meds:.methocarbamol (ROBAXIN) IV, morphine injection   ASSESMENT:   *  S/p 5/11 repair incarcerated umbilical hernia  *   ETOH cirrhosis of liver.  Child's C.  MELD 17.  Followed in [place], DR   *   SBP 01/23/2019.  On course of Augmentin after recent discharge.    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    *   Thrombocytopenia chronic.  Splenomegaly.    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  ? If ever had variceal bleed or EVL.    *   Acute on chronic macrocytic anemia.     PLAN   *   Supportive post op care per surgery.    *   Once IV abx finished, will need  SBP prophylaxis abx started.   ? Restart Home doses Lactulose, 15 ml/day, Lasix 40 mg/day, Aldactone 50 mg/day, Inderal 20 mg BID?   Will restart PPI, takes Omeprazole at home. Restart Propanolol as pt tells me when he does not take it or has tried to lower dose he gets tachy into 150s and currently funning in 70s.        Jennye Moccasin  02/07/2019, 10:21 AM Phone [phone]

## 2019-02-07 NOTE — Progress Notes (Addendum)
PROGRESS NOTE  Grant [name] WUJ:[id number] DOB: Sep 16, 1981 DOA: 02/05/2019 PCP: Patient, No Pcp Per   LOS: 2 days   Brief narrative: Grant [name] is a 38 y.o. male with history of alcoholic liver cirrhosis was recently admitted and discharged 3 days ago after being treated for right-sided empyema with chest tube which has been removed eventually and also had SBP to be on Augmentin for 3 more weeks presented with worsening abdominal pain around his umbilical hernia for last 24 hours.  Patient stated that his umbilical hernia increased in size with increasing pain  In the ER patient was afebrile.  On exam patient has umbilical hernia which is tender to touch and non reducible.   CT abdomen pelvis was done which showed mild ascites and also redemonstration of his right-sided pleural effusion-loculated with consolidation.  Umbilical hernia with the bowel seen.  Dr. Dwain Sarna, general surgeon was consulted and patient was admitted for incarcerated hernia.  Labs revealed sodium of 134 creatinine 1.6 hemoglobin 7.9 potassium 3.8 ALT 31 albumin 2.4 platelets 143.  Subjective: Patient feels better today after surgical intervention.  Denies any nausea or vomiting.  Has not had a bowel movement or passed flatus.  He has mild abdominal pain at the site of surgery.  Assessment/Plan:  Principal Problem:   Obstructed umbilical hernia Active Problems:   Alcoholic cirrhosis of liver with ascites (HCC)   Symptomatic anemia   Cirrhosis (HCC)   Portal vein thrombosis   Hernia, umbilical  Incarcerated umbilical hernia -status post exploratory laparotomy and repair on 02/06/2019.  Will follow surgical recommendations on further care.  Ambulate the patient as able.  History of alcoholic cirrhosis of liver, Child's C. MELD 17, spontaneous bacterial peritonitis.  GI is on board.  Currently on IV antibiotics.  Continue with that.  He was on course of Augmentin after recent discharge.  Patient [facility] up at the  [facility].  Recent right-sided loculated pleural effusion, empyema.  Had chest tube on last admission (01/25/19) which was removed prior to discharge.  Patient was supposed to continue Augmentin on discharge.  Currently on vancomycin and Zosyn.  Continue with the current regimen.  Currently, patient does not have any pulmonary symptoms and  is stable.  Will not consider further intervention at this time.  Recent history of SBP, CAT scan showed some ascites.  Status post exploratory laparotomy with drainage of ascitic fluid.  Alcoholic liver cirrhosis -patient states he has not any alcohol for last 1 year.  continue thiamine. Hold Lasix and spironolactone for now.   Previous history of portal vein thrombosis used to be on Xarelto which has been hold since last admission since CT scan does not show any further evidence.  GI recommending no need for anticoagulation.   Chronic thrombocytopenia.  From liver disease.  Will closely monitor.  No indication of bleeding at this time.    chronic macrocytic anemia.  Likely from chronic liver disease.  Closely monitor.  Transfuse if hemoglobin is less than 7.   VTE Prophylaxis: SCD  Code Status: Full code  Family Communication: None.   Disposition Plan: Home likely in 2 to 3 days.  Will follow GI and surgical recommendations.  Will need to restart home medications on discharge.  Consultants:  General surgery   GI   Procedures:  Exploratory laparotomy with primary closure of umbilical hernia on 02/06/2019  Antibiotics: Anti-infectives (From admission, onward)   Start     Dose/Rate Route Frequency Ordered Stop   02/06/19 0600  vancomycin (VANCOCIN)  1,250 mg in sodium chloride 0.9 % 250 mL IVPB     1,250 mg 166.7 mL/hr over 90 Minutes Intravenous Every 12 hours 02/05/19 2335     02/06/19 0600  piperacillin-tazobactam (ZOSYN) IVPB 3.375 g     3.375 g 12.5 mL/hr over 240 Minutes Intravenous Every 8 hours 02/05/19 2335     02/05/19 2100   vancomycin (VANCOCIN) 2,000 mg in sodium chloride 0.9 % 500 mL IVPB     2,000 mg 250 mL/hr over 120 Minutes Intravenous  Once 02/05/19 2031 02/06/19 0020   02/05/19 2100  piperacillin-tazobactam (ZOSYN) IVPB 4.5 g  Status:  Discontinued     4.5 g 200 mL/hr over 30 Minutes Intravenous  Once 02/05/19 2031 02/05/19 2101   02/05/19 2100  piperacillin-tazobactam (ZOSYN) IVPB 3.375 g     3.375 g 100 mL/hr over 30 Minutes Intravenous  Once 02/05/19 2101 02/05/19 2156      Objective: Vitals:   02/07/19 1000 02/07/19 1006  BP: 123/67   Pulse: 74   Resp: 18   Temp: 97.9 F (36.6 C)   SpO2: 93% 94%    Intake/Output Summary (Last 24 hours) at 02/07/2019 1344 Last data filed at 02/07/2019 0649 Gross per 24 hour  Intake 1250 ml  Output 675 ml  Net 575 ml   Filed Weights   02/06/19 0024 02/06/19 0613 02/06/19 2113  Weight: 94.7 kg 94.2 kg 94.2 kg   Body mass index is 29.8 kg/m.   Physical Exam: GENERAL: Patient is alert awake and oriented.  Not in obvious distress HENT: No scleral pallor or icterus. Pupils equally reactive to light. Oral mucosa is moist.  Mild pallor noted NECK: is supple, no palpable thyroid enlargement. CHEST: Clear to auscultation. No crackles or wheezes. Non tender on palpation. Diminished breath sounds bilaterally. CVS: S1 and S2 heard, no murmur. Regular rate and rhythm. No pericardial rub. ABDOMEN: Soft, incision site clean dry and intact.   Abdominal binder in place No palpable hepato-splenomegaly. EXTREMITIES: Trace edema CNS: Cranial nerves are intact. No focal motor or sensory deficits.  No flapping tremors SKIN: warm and dry without rashes.  Data Review: I have personally reviewed the following laboratory data and studies,  CBC: Recent Labs  Lab 02/01/19 0457  02/02/19 0239 02/02/19 1136 02/05/19 1531 02/06/19 0612 02/07/19 0300  WBC 8.9  --  9.8  --  5.1 5.7 5.3  NEUTROABS  --   --   --   --  3.2 3.8  --   HGB 7.3*   < > 7.1* 8.0* 7.9* 8.2*  7.3*  HCT 22.0*   < > 22.2* 24.1* 24.7* 25.1* 22.9*  MCV 103.8*  --  105.2*  --  106.5* 104.6* 106.0*  PLT 132*  --  133*  --  143* 161 126*   < > = values in this interval not displayed.   Basic Metabolic Panel: Recent Labs  Lab 02/01/19 0457 02/01/19 1609 02/02/19 0239 02/05/19 1531 02/06/19 0612 02/07/19 0300  NA 134* 134*  --  134* 137 138  K 4.3 4.6  --  3.8 3.8 4.2  CL 110 107  --  109 105 109  CO2 15* 16*  --  18* 20* 17*  GLUCOSE 85 91  --  97 101* 125*  BUN 29* 23*  --  CREATININE 1.17 0.95  --  0.64 0.70 0.82  CALCIUM 8.7* 8.9  --  9.3 9.7 9.1  MG  --   --  1.6*  --   --   --  Liver Function Tests: Recent Labs  Lab 02/01/19 1609 02/05/19 1531 02/06/19 0612 02/07/19 0300  AST 85* 78* 77* 59*  ALT 30 31 31 26   ALKPHOS 96 87 87 72  BILITOT 3.8* 3.9* 4.1* 3.5*  PROT 7.7 8.0 8.7* 7.5  ALBUMIN 2.2* 2.4* 2.3* 2.4*   No results for input(s): LIPASE, AMYLASE in the last 168 hours. No results for input(s): AMMONIA in the last 168 hours. Cardiac Enzymes: No results for input(s): CKTOTAL, CKMB, CKMBINDEX, TROPONINI in the last 168 hours. BNP (last 3 results) Recent Labs    01/21/19 1648  BNP 318.0*    ProBNP (last 3 results) No results for input(s): PROBNP in the last 8760 hours.  CBG: Recent Labs  Lab 02/06/19 0546 02/06/19 1826 02/07/19 0000 02/07/19 0619 02/07/19 1216  GLUCAP 87 105* 116* 113* 130*   Recent Results (from the past 240 hour(s))  Blood culture (routine x 2)     Status: None (Preliminary result)   Collection Time: 02/05/19  8:45 PM  Result Value Ref Range Status   Specimen Description BLOOD RIGHT ARM  Final   Special Requests   Final    BOTTLES DRAWN AEROBIC AND ANAEROBIC Blood Culture adequate volume   Culture   Final    NO GROWTH 2 DAYS Performed at [facility], [address] [place], [place] [address]    Report Status PENDING  Incomplete  Blood culture (routine x 2)     Status: None (Preliminary result)    Collection Time: 02/05/19  8:53 PM  Result Value Ref Range Status   Specimen Description BLOOD LEFT LOWER ARM  Final   Special Requests   Final    BOTTLES DRAWN AEROBIC AND ANAEROBIC Blood Culture results may not be optimal due to an excessive volume of blood received in culture bottles   Culture   Final    NO GROWTH 2 DAYS Performed at [facility], [address] [place], [place] [address]    Report Status PENDING  Incomplete  SARS Coronavirus 2 (CEPHEID - Performed in [facility] hospital lab), Hosp Order     Status: None   Collection Time: 02/05/19  9:09 PM  Result Value Ref Range Status   SARS Coronavirus 2 NEGATIVE NEGATIVE Final    Comment: (NOTE) If result is NEGATIVE SARS-CoV-2 target nucleic acids are NOT DETECTED. The SARS-CoV-2 RNA is generally detectable in upper and lower  respiratory specimens during the acute phase of infection. The lowest  concentration of SARS-CoV-2 viral copies this assay can detect is 250  copies / mL. A negative result does not preclude SARS-CoV-2 infection  and should not be used as the sole basis for treatment or other  patient management decisions.  A negative result may occur with  improper specimen collection / handling, submission of specimen other  than nasopharyngeal swab, presence of viral mutation(s) within the  areas targeted by this assay, and inadequate number of viral copies  (<250 copies / mL). A negative result must be combined with clinical  observations, patient history, and epidemiological information. If result is POSITIVE SARS-CoV-2 target nucleic acids are DETECTED. The SARS-CoV-2 RNA is generally detectable in upper and lower  respiratory specimens dur ing the acute phase of infection.  Positive  results are indicative of active infection with SARS-CoV-2.  Clinical  correlation with patient history and other diagnostic information is  necessary to determine patient infection status.  Positive results do  not rule  out bacterial infection or  co-infection with other viruses. If result is PRESUMPTIVE POSTIVE SARS-CoV-2 nucleic acids MAY BE PRESENT.   A presumptive positive result was obtained on the submitted specimen  and confirmed on repeat testing.  While 2019 novel coronavirus  (SARS-CoV-2) nucleic acids may be present in the submitted sample  additional confirmatory testing may be necessary for epidemiological  and / or clinical management purposes  to differentiate between  SARS-CoV-2 and other Sarbecovirus currently known to infect humans.  If clinically indicated additional testing with an alternate test  methodology [phone]) is advised. The SARS-CoV-2 RNA is generally  detectable in upper and lower respiratory sp ecimens during the acute  phase of infection. The expected result is Negative. Fact Sheet for Patients:  BoilerBrush.com.cy Fact Sheet for Healthcare Providers: https://pope.com/ This test is not yet approved or cleared by the [place] FDA and has been authorized for detection and/or diagnosis of SARS-CoV-2 by FDA under an Emergency Use Authorization (EUA).  This EUA will remain in effect (meaning this test can be used) for the duration of the COVID-19 declaration under Section 564(b)(1) of the Act, 21 U.S.C. section 360bbb-3(b)(1), unless the authorization is terminated or revoked sooner. Performed at [facility], [address] [place], [place] [address]      Studies: Ct Chest W Contrast  Result Date: 02/06/2019 CLINICAL DATA:  Follow-up pneumonia EXAM: CT CHEST WITH CONTRAST TECHNIQUE: Multidetector CT imaging of the chest was performed during intravenous contrast administration. CONTRAST:  62mL OMNIPAQUE IOHEXOL 300 MG/ML  SOLN COMPARISON:  CT chest, 01/21/2019, CT-guided drain placement, 01/25/2019 FINDINGS: Cardiovascular: No significant vascular findings. Normal heart size. No pericardial effusion.  Mediastinum/Nodes: No enlarged mediastinal, hilar, or axillary lymph nodes. Thyroid gland, trachea, and esophagus demonstrate no significant findings. Lungs/Pleura: Small left, moderate right pleural effusions with associated atelectasis or consolidation. There remains a low-attenuation collection in the dependent right pleural space, likely with adjacent pleural thickening. This is essentially unchanged in size compared to examination dated 01/21/2019, measuring approximately 6.7 x 4.8 cm. Upper Abdomen: No acute abnormality. Ascites, cirrhotic morphology of the liver, and partially imaged splenomegaly. Musculoskeletal: No chest wall mass or suspicious bone lesions identified. IMPRESSION: 1. Small left, moderate right pleural effusions with associated atelectasis or consolidation. There remains a low-attenuation collection in the dependent right pleural space, likely with adjacent pleural thickening. This is essentially unchanged in size compared to examination dated 01/21/2019, measuring approximately 6.7 x 4.8 cm. Differential considerations include chronic, loculated pleural fluid, abscess, or hematoma. 2.  Stigmata of cirrhosis in the partially included upper abdomen. Electronically Signed   By: Lauralyn Primes M.D.   On: 02/06/2019 10:52   Ct Abdomen Pelvis W Contrast  Result Date: 02/05/2019 CLINICAL DATA:  Abdominal pain, umbilical hernia EXAM: CT ABDOMEN AND PELVIS WITH CONTRAST TECHNIQUE: Multidetector CT imaging of the abdomen and pelvis was performed using the standard protocol following bolus administration of intravenous contrast. CONTRAST:  OMNIPAQUE IOHEXOL 300 MG/ML  SOLN COMPARISON:  01/21/2019 FINDINGS: Lower chest: Redemonstrated right pleural effusion with high density, likely loculated fluid collection of the dependent pleural space similar in size to prior examination, following drainage procedure. Associated atelectasis or consolidation of the right lower lobe. Small left pleural  effusion. Hepatobiliary: Cirrhotic morphology of the liver. Tiny gallstone in the bladder. No gallbladder wall thickening, or biliary dilatation. Pancreas: Unremarkable. No pancreatic ductal dilatation or surrounding inflammatory changes. Spleen: Splenomegaly. Adrenals/Urinary Tract: Adrenal glands are unremarkable. Kidneys are normal, without renal calculi, focal lesion, or hydronephrosis. Bladder is unremarkable.  Stomach/Bowel: Stomach is within normal limits. No evidence of bowel wall thickening, distention, or inflammatory changes.The small bowel is generally decompressed or air and fluid-filled without significant distention. Largest small bowel loops measure 3.1 cm in caliber. Vascular/Lymphatic: No significant vascular findings are present. No enlarged abdominal or pelvic lymph nodes. Reproductive: No mass or other abnormality. Other: Redemonstrated small bowel and fluid containing umbilical hernia. Small volume ascites. Musculoskeletal: No acute or significant osseous findings. IMPRESSION: 1. Redemonstrated small bowel and fluid containing umbilical hernia. The small bowel is generally decompressed or air and fluid-filled without significant distention. Lack of obstruction by CT does not exclude incarceration or strangulation if clinically suspected. 2.  Small volume ascites. 3.  Stigmata of cirrhosis and splenomegaly. 4. Redemonstrated right pleural effusion with high density, likely loculated fluid collection of the dependent pleural space similar in size to prior examination, following drainage procedure. Associated atelectasis or consolidation of the right lower lobe. Small left pleural effusion. Electronically Signed   By: Lauralyn Primes M.D.   On: 02/05/2019 18:04   Dg Chest Portable 1 View  Result Date: 02/05/2019 CLINICAL DATA:  38 year old male with abdominal pain for 1 day. Cirrhosis. Right lung pneumonia and loculated pleural effusion last month, pleural drain placed with CT guidance on  01/25/2019. EXAM: PORTABLE CHEST 1 VIEW COMPARISON:  Chest radiographs 01/31/2019 and earlier. FINDINGS: Portable AP semi upright view at 1524 hours. Stable veiling opacity in the right lower lung. Stable lung volumes and mediastinal contours. Visualized tracheal air column is within normal limits. No pneumothorax or pulmonary edema. No confluent opacity in the left lung. No acute osseous abnormality identified. IMPRESSION: Stable right pleural effusion since 01/31/2019. No new cardiopulmonary abnormality. Electronically Signed   By: Odessa Fleming M.D.   On: 02/05/2019 15:33    Scheduled Meds: . lactulose  10 g Oral BID  . pantoprazole  40 mg Oral Q0600  . propranolol  20 mg Oral BID  . thiamine injection  100 mg Intravenous Daily    Continuous Infusions: . methocarbamol (ROBAXIN) IV    . piperacillin-tazobactam (ZOSYN)  IV 3.375 g (02/07/19 0649)  . vancomycin 1,250 mg (02/07/19 0507)     Joycelyn Das, MD  [facility] 02/07/2019

## 2019-02-07 NOTE — Progress Notes (Signed)
Subjective No acute events. Feeling much better since going to OR. Hungry. Denies n/v. Denies flatus/bm yet.  Objective: Vital signs in last 24 hours: Temp:  [97.6 F (36.4 C)-98.2 F (36.8 C)] 98 F (36.7 C) (05/12 0453) Pulse Rate:  [77-90] 77 (05/12 0453) Resp:  [18-21] 18 (05/12 0453) BP: (102-122)/(52-90) 122/69 (05/12 0453) SpO2:  [87 %-96 %] 92 % (05/12 0453) Weight:  [94.2 kg] 94.2 kg (05/11 2113) Last BM Date: 02/05/19  Intake/Output from previous day: 05/11 0701 - 05/12 0700 In: 1500 [I.V.:1000; IV Piggyback:500] Out: 1025 [Urine:975; Blood:50] Intake/Output this shift: No intake/output data recorded.  Gen: NAD, comfortable CV: RRR Pulm: Normal work of breathing Abd: Soft, NT. Not signficantly distended. Binder in place. Incision dry, no erythema Ext: SCDs in place  Lab Results: CBC  Recent Labs    02/06/19 0612 02/07/19 0300  WBC 5.7 5.3  HGB 8.2* 7.3*  HCT 25.1* 22.9*  PLT 161 126*   BMET Recent Labs    02/06/19 0612 02/07/19 0300  NA 137 138  K 3.8 4.2  CL 105 109  CO2 20* 17*  GLUCOSE 101* 125*  BUN 6 11  CREATININE 0.70 0.82  CALCIUM 9.7 9.1   PT/INR Recent Labs    02/06/19 0612 02/07/19 0300  LABPROT 17.3* 17.8*  INR 1.4* 1.5*   ABG No results for input(s): PHART, HCO3 in the last 72 hours.  Invalid input(s): PCO2, PO2  Studies/Results:  Anti-infectives: Anti-infectives (From admission, onward)   Start     Dose/Rate Route Frequency Ordered Stop   02/06/19 0600  vancomycin (VANCOCIN) 1,250 mg in sodium chloride 0.9 % 250 mL IVPB     1,250 mg 166.7 mL/hr over 90 Minutes Intravenous Every 12 hours 02/05/19 2335     02/06/19 0600  piperacillin-tazobactam (ZOSYN) IVPB 3.375 g     3.375 g 12.5 mL/hr over 240 Minutes Intravenous Every 8 hours 02/05/19 2335     02/05/19 2100  vancomycin (VANCOCIN) 2,000 mg in sodium chloride 0.9 % 500 mL IVPB     2,000 mg 250 mL/hr over 120 Minutes Intravenous  Once 02/05/19 2031 02/06/19 0020   02/05/19 2100  piperacillin-tazobactam (ZOSYN) IVPB 4.5 g  Status:  Discontinued     4.5 g 200 mL/hr over 30 Minutes Intravenous  Once 02/05/19 2031 02/05/19 2101   02/05/19 2100  piperacillin-tazobactam (ZOSYN) IVPB 3.375 g     3.375 g 100 mL/hr over 30 Minutes Intravenous  Once 02/05/19 2101 02/05/19 2156       Assessment/Plan: Patient Active Problem List   Diagnosis Date Noted  . Obstructed umbilical hernia 02/05/2019  . Hernia, umbilical 02/05/2019  . Acute respiratory failure with hypoxia (HCC) 01/21/2019  . RLL pneumonia (HCC) 01/21/2019  . Empyema of right pleural space (HCC) 01/21/2019  . Hyponatremia 01/21/2019  . Supratherapeutic INR 01/21/2019  . Lactic acidosis 01/21/2019  . Portal vein thrombosis 08/02/2018  . Abdominal distension   . Community acquired pneumonia   . LLL pneumonia (HCC) 01/31/2018  . Symptomatic anemia 01/31/2018  . Hypomagnesemia 01/31/2018  . Hypokalemia 01/31/2018  . Esophageal reflux   . Morbid obesity due to excess calories (HCC)   . Dehydration 05/09/2016  . Alcoholic cirrhosis of liver with ascites (HCC) 05/09/2016  . Diarrhea 05/09/2016  . Campylobacter diarrhea 05/09/2016  . Alcohol abuse 05/09/2016  . Hematochezia   . Thrombocytopenia (HCC) 09/29/2011  . Splenomegaly 09/29/2011  . Cirrhosis (HCC) 09/29/2011  . Ascites 09/29/2011   s/p Procedure(s): EXPLORATORY LAPAROTOMY WITH PRIMARY  CLOSURE OF UMBLICAL HERNIA 02/06/2019  -Clear liquids -Ambulate 5x/day -Continue abd binder   LOS: 2 days   Stephanie Coup. Cliffton Asters, M.D. [facility]

## 2019-02-08 LAB — COMPREHENSIVE METABOLIC PANEL
ALT: 26 U/L (ref 0–44)
AST: 52 U/L — ABNORMAL HIGH (ref 15–41)
Albumin: 2.5 g/dL — ABNORMAL LOW (ref 3.5–5.0)
Alkaline Phosphatase: 67 U/L (ref 38–126)
Anion gap: 11 (ref 5–15)
BUN: 12 mg/dL (ref 6–20)
CO2: 20 mmol/L — ABNORMAL LOW (ref 22–32)
Calcium: 9.3 mg/dL (ref 8.9–10.3)
Chloride: 107 mmol/L (ref 98–111)
Creatinine, Ser: 0.88 mg/dL (ref 0.61–1.24)
GFR calc Af Amer: >60 mL/min (ref >60)
GFR calc non Af Amer: >60 mL/min (ref >60)
Glucose, Bld: 108 mg/dL — ABNORMAL HIGH (ref 70–99)
Potassium: 3.4 mmol/L — ABNORMAL LOW (ref 3.5–5.1)
Sodium: 138 mmol/L (ref 135–145)
Total Bilirubin: 2.7 mg/dL — ABNORMAL HIGH (ref 0.3–1.2)
Total Protein: 8.1 g/dL (ref 6.5–8.1)

## 2019-02-08 LAB — CBC
HCT: 22.7 % — ABNORMAL LOW (ref 39.0–52.0)
Hemoglobin: 7.2 g/dL — ABNORMAL LOW (ref 13.0–17.0)
MCH: 34 pg (ref 26.0–34.0)
MCHC: 31.7 g/dL (ref 30.0–36.0)
MCV: 107.1 fL — ABNORMAL HIGH (ref 80.0–100.0)
Platelets: 140 10*3/uL — ABNORMAL LOW (ref 150–400)
RBC: 2.12 MIL/uL — ABNORMAL LOW (ref 4.22–5.81)
RDW: 17.7 % — ABNORMAL HIGH (ref 11.5–15.5)
WBC: 5.7 10*3/uL (ref 4.0–10.5)
nRBC: 0 % (ref 0.0–0.2)

## 2019-02-08 LAB — GLUCOSE, CAPILLARY
Glucose-Capillary: 106 mg/dL — ABNORMAL HIGH (ref 70–99)
Glucose-Capillary: 112 mg/dL — ABNORMAL HIGH (ref 70–99)
Glucose-Capillary: 114 mg/dL — ABNORMAL HIGH (ref 70–99)
Glucose-Capillary: 81 mg/dL (ref 70–99)

## 2019-02-08 LAB — PHOSPHORUS: Phosphorus: 3.4 mg/dL (ref 2.5–4.6)

## 2019-02-08 LAB — MAGNESIUM: Magnesium: 1.4 mg/dL — ABNORMAL LOW (ref 1.7–2.4)

## 2019-02-08 MED ORDER — POTASSIUM CHLORIDE CRYS ER 20 MEQ PO TBCR
40.0000 meq | EXTENDED_RELEASE_TABLET | Freq: Once | ORAL | Status: AC
  Administered 2019-02-08: 40 meq via ORAL
  Filled 2019-02-08: qty 2

## 2019-02-08 MED ORDER — CIPROFLOXACIN HCL 750 MG PO TABS: 750.0000 mg | ORAL_TABLET | ORAL | 1 refills | Status: AC

## 2019-02-08 NOTE — Progress Notes (Signed)
DISCHARGE NOTE SN[facility] Grant [name] to be discharged Home per MD order. Patient verbalized understanding.  Skin clean, dry and intact without evidence of skin break down, no evidence of skin tears noted. IV catheter discontinued intact. Site without signs and symptoms of complications. Dressing and pressure applied. Pt denies pain at the site currently. No complaints noted.  Patient free of lines, drains, and wounds.   Discharge packet assembled. An After Visit Summary (AVS) was printed and given to the patientl. Patient escorted via stretcher and discharged to family member via private vehicle. All questions and concerns addressed.   Katrina Stack, RN

## 2019-02-08 NOTE — Discharge Instructions (Signed)
Ascites  Ascites is a collection of too much fluid in the abdomen. Ascites can range from mild to severe. If ascites is not treated, it can get worse. What are the causes? This condition may be caused by:  A liver condition called cirrhosis. This is the most common cause of ascites.  Long-term (chronic) or alcoholic hepatitis.  Infection or inflammation in the abdomen.  Cancer in the abdomen.  Heart failure.  Kidney disease.  Inflammation of the pancreas.  Clots in the veins of the liver. What are the signs or symptoms? Symptoms of this condition include:  A feeling of fullness in the abdomen. This is common.  An increase in the size of the abdomen or waist.  Swelling in the legs.  Swelling of the scrotum (in men).  Difficulty breathing.  Pain in the abdomen.  Sudden weight gain. If the condition is mild, you may not have symptoms. How is this diagnosed? This condition is diagnosed based on your medical history and a physical exam. Your health care provider may order imaging tests, such as an ultrasound or CT scan of your abdomen. How is this treated? Treatment for this condition depends on the cause of the ascites. It may include:  Taking a pill to make you urinate. This is called a water pill (diuretic pill).  Strictly reducing your salt (sodium) intake. Salt can cause extra fluid to be kept (retained) in the body, and this makes ascites worse.  Having a procedure to remove fluid from your abdomen (paracentesis).  Having a procedure that connects two of the major veins within your liver and relieves pressure on your liver. This is called a TIPS procedure (transjugular intrahepatic portosystemic shunt procedure).  Placement of a drainage catheter (peritoneovenous shunt) to manage the extra fluid in the abdomen. Ascites may go away or improve when the condition that caused it is treated. Follow these instructions at home:  Keep track of your weight. To do  this, weigh yourself at the same time every day and write down your weight.  Keep track of how much you drink and any changes in how much or how often you urinate.  Follow any instructions that your health care provider gives you about how much to drink.  Try not to eat salty (high-sodium) foods.  Take over-the-counter and prescription medicines only as told by your health care provider.  Keep all follow-up visits as told by your health care provider. This is important.  Report any changes in your health to your health care provider, especially if you develop new symptoms or your symptoms get worse. Contact a health care provider if:  You gain more than 3 lb (1.36 kg) in 3 days.  Your waist size increases.  You have new swelling in your legs.  The swelling in your legs gets worse. Get help right away if:  You have a fever.  You are confused.  You have new or worsening breathing trouble.  You have new or worsening pain in your abdomen.  You have new or worsening swelling in the scrotum (in men). Summary  Ascites is a collection of too much fluid in the abdomen.  Ascites may be caused by various conditions, such as cirrhosis, hepatitis, cancer, or congestive heart failure.  Symptoms may include swelling of the abdomen and other areas due to extra fluid in the body.  Treatments may involve dietary changes, medicines, or procedures. This information is not intended to replace advice given to you by your health  care provider. Make sure you discuss any questions you have with your health care provider. Document Released: 09/14/2005 Document Revised: 05/27/2017 Document Reviewed: 05/27/2017 Else[employer] Interactive Patient Education  2019 [employer] Cirrhosis  Cirrhosis is long-term (chronic) liver injury. The liver is the body's largest internal organ, and it performs many functions. It converts food into energy, removes toxic material from the blood, makes important  proteins, and absorbs necessary vitamins from food. In cirrhosis, healthy liver cells are replaced by scar tissue. This prevents blood from flowing through the liver, making it difficult for the liver to function. Scarring of the liver cannot be reversed, but treatment can prevent it from getting worse. What are the causes? Common causes of this condition are hepatitis C and long-term alcohol abuse. Other causes include:  Nonalcoholic fatty liver disease. This happens when fat is deposited in the liver by causes other than alcohol.  Hepatitis B infection.  Autoimmune hepatitis. In this condition, the body's defense system (immune system) mistakenly attacks the liver cells, causing irritation and swelling (inflammation).  Diseases that cause blockage of ducts inside the liver.  Inherited liver diseases, such as hemochromatosis. This is one of the most common inherited liver diseases. In this disease, deposits of iron collect in the liver and other organs.  Reactions to certain long-term medicines, such as amiodarone, a heart medicine.  Parasitic infections. These include schistosomiasis, which is caused by a flatworm.  Long-term contact to certain toxins. These toxins include certain organic solvents, such as toluene and chloroform. What increases the risk? You are more likely to develop this condition if:  You have certain types of viral hepatitis.  You abuse alcohol, especially if you are male.  You are overweight.  You share needles.  You have unprotected sex with someone who has viral hepatitis. What are the signs or symptoms? You may not have any signs and symptoms at first. Symptoms may not develop until the damage to your liver starts to get worse. Early symptoms may include:  Weakness and tiredness (fatigue).  Changes in sleep patterns or having trouble sleeping.  Itchiness.  Tenderness in the right-upper part of your abdomen.  Weight loss and muscle  loss.  Nausea.  Loss of appetite.  Appearance of tiny blood vessels under the skin. Later symptoms may include:  Fatigue or weakness that is getting worse.  Yellow skin and eyes (jaundice).  Buildup of fluid in the abdomen (ascites). You may notice that your clothes are tight around your waist.  Weight gain.  Swelling of the feet and ankles (edema).  Trouble breathing.  Easy bruising and bleeding.  Vomiting blood.  Black or bloody stool.  Mental confusion. How is this diagnosed? Your health care provider may suspect cirrhosis based on your symptoms and medical history, especially if you have other medical conditions or a history of alcohol abuse. Your health care provider will do a physical exam to feel your liver and to check for signs of cirrhosis. He or she may perform other tests, including:  Blood tests to check: ? For hepatitis B or C. ? Kidney function. ? Liver function.  Imaging tests such as: ? MRI or CT scan to look for changes seen in advanced cirrhosis. ? Ultrasound to see if normal liver tissue is being replaced by scar tissue.  A procedure in which a long needle is used to take a sample of liver tissue to be checked in a lab (biopsy). Liver biopsy can confirm the diagnosis of cirrhosis. How is this  treated? Treatment for this condition depends on how damaged your liver is and what caused the damage. It may include treating the symptoms of cirrhosis, or treating the underlying causes in order to slow the damage. Treatment may include:  Making lifestyle changes, such as: ? Eating a healthy diet. You may need to work with your health care provider or a diet and nutrition specialist (dietitian) to develop an eating plan. ? Restricting salt intake. ? Maintaining a healthy weight. ? Not abusing drugs or alcohol.  Taking medicines to: ? Treat liver infections or other infections. ? Control itching. ? Reduce fluid buildup. ? Reduce certain blood  toxins. ? Reduce risk of bleeding from enlarged blood vessels in the stomach or esophagus (varices).  Liver transplant. In this procedure, a liver from a donor is used to replace your diseased liver. This is done if cirrhosis has caused liver failure. Other treatments and procedures may be done depending on the problems that you get from cirrhosis. Common problems include liver-related kidney failure (hepatorenal syndrome). Follow these instructions at home:   Take medicines only as told by your health care provider. Do not use medicines that are toxic to your liver. Ask your health care provider before taking any new medicines, including over-the-counter medicines.  Rest as needed.  Eat a well-balanced diet. Ask your health care provider or dietitian for more information.  Limit your salt or water intake, if your health care provider asks you to do this.  Do not drink alcohol. This is especially important if you are taking acetaminophen.  Keep all follow-up visits as told by your health care provider. This is important. Contact a health care provider if you:  Have fatigue or weakness that is getting worse.  Develop swelling of the hands, feet, legs, or face.  Have a fever.  Develop loss of appetite.  Have nausea or vomiting.  Develop jaundice.  Develop easy bruising or bleeding. Get help right away if you:  Vomit bright red blood or a material that looks like coffee grounds.  Have blood in your stools.  Notice that your stools appear black and tarry.  Become confused.  Have chest pain or trouble breathing. Summary  Cirrhosis is chronic liver injury. Liver damage cannot be reversed. Common causes are hepatitis C and long-term alcohol abuse.  Tests used to diagnose cirrhosis include blood tests, imaging tests, and liver biopsy.  Treatment for this condition involves treating the underlying cause. Avoid alcohol, drugs, salt, and medicines that may damage your  liver.  Contact your health care provider if you develop ascites, edema, jaundice, fever, nausea or vomiting, easy bruising or bleeding, or worsening fatigue. This information is not intended to replace advice given to you by your health care provider. Make sure you discuss any questions you have with your health care provider. Document Released: 09/14/2005 Document Revised: 08/04/2017 Document Reviewed: 08/04/2017 [employer] Interactive Patient Education  2019 [employer].

## 2019-02-08 NOTE — Discharge Summary (Signed)
Physician Discharge Summary  Grant [name] ZOX:[id number] DOB: [birth year] DOA: 02/05/2019  PCP: Patient, No Pcp Per  Admit date: 02/05/2019 Discharge date: 02/08/2019  Admitted From: Home Disposition:  Home  Recommendations for Outpatient Follow-up:  1. Follow up with PCP in 1 week 2. Follow-up with gastroenterology in [place] in 1-2 weeks 3. Follow-up with general surgery as scheduled on 02/23/2019 4. Please obtain BMP/CBC in one week    Home Health: No  Equipment/Devices: none  Discharge Condition: stable CODE STATUS: Full Code Diet recommendation: Heart Healthy, Low salt, fluid restriction  History of present illness:  Grant [name] a 38 y.o.malewithhistory of alcoholic liver cirrhosis was recently admitted and discharged 3 days ago after being treated for right-sided empyema with chest tube which has been removed eventually and also had SBP to be on Augmentin for 3 more weeks presented with worsening abdominal pain around his umbilical hernia for last 24 hours. Patient stated that his umbilical hernia increased in size with increasing pain    In the ER patient was afebrile. On exam patient has umbilical hernia which is tender to touch and non reducible.  CT abdomen pelvis was done which showed mild ascites and also redemonstration of his right-sided pleural effusion-loculated with consolidation. Umbilical hernia with the bowel seen. Dr. Dwain Sarna, general surgeon was consulted and patient was admitted for incarcerated hernia. Labs revealed sodium of 134 creatinine 1.6 hemoglobin 7.9 potassium 3.8 ALT 31 albumin 2.4 platelets 143.  Hospital course:  Incarcerated umbilical hernia Patient presenting with progressive abdominal discomfort.  CT abdomen/pelvis notable for small bowel and fluid containing umbilical hernia.  General surgery was consulted and patient underwent exploratory laparotomy and repair on 02/06/2019.  Patient tolerated procedure well and his diet  was advanced without issue.  Patient will follow-up with general surgery clinic on 02/23/2019 as scheduled.  History of alcoholic cirrhosis of liver, Child's C. MELD 17 Hx spontaneous bacterial peritonitis.   Gastroenterology was consulted and followed during hospital course.  CT abdomen/pelvis notable for ascites.  Underwent exploratory laparotomy with drainage of ascitic fluid, no fluid sent for culture.  Patient follows with a gastroenterologist in [place], [place].  Patient continues to abstain from alcohol use.  Patient will continue lactulose, Lasix, Spironolactone, omeprazole and propranolol.  Continue thiamine and folic acid.  Patient continues on Augmentin prescribed for recent right-sided empyema with Augmentin.  Discussed with GI, who recommends following Augmentin, will need SBP prophylaxis with ciprofloxacin 750 mg p.o. once weekly.  Recommend low-salt diet with 1,578mL fluid restriction. Recommend patient follow-up with his gastroenterologist in [place], [place].   Recent right-sided loculated pleural effusion, empyema.  Had chest tube on last admission (01/25/19) which was removed prior to discharge.  Patient was supposed to continue Augmentin on discharge.  Started on vancomycin and zosyn during the hospitalization. Currently, patient does not have any pulmonary symptoms and is stable.     Previous history of portal vein thrombosis  Patient previously on Xarelto which has been on hold since his last hospital admission.  CT abdomen/pelvis does not show any further evidence of portal vein thrombosis.  Gastroenterology recommends no need for further anticoagulation at this time.  Xarelto has subsequently been discontinued.   Chronic thrombocytopenia.   Etiology likely from underlying cirrhosis.  Platelet level remained stable and was 140 at time of discharge.  Discharge Diagnoses:  Active Problems:   Alcoholic cirrhosis of liver with ascites (HCC)   Symptomatic anemia    Cirrhosis (HCC)   Portal vein thrombosis  Discharge Instructions  Discharge Instructions    Call MD for:  persistant nausea and vomiting   Complete by:  As directed    Call MD for:  temperature >100.4   Complete by:  As directed    Diet - low sodium heart healthy   Complete by:  As directed    Increase activity slowly   Complete by:  As directed      Allergies as of 02/08/2019   No Known Allergies     Medication List    TAKE these medications   amoxicillin-clavulanate 875-125 MG tablet Commonly known as:  AUGMENTIN Take 1 tablet by mouth every 12 (twelve) hours for 21 days.   ciprofloxacin 750 MG tablet Commonly known as:  CIPRO Take 1 tablet (750 mg total) by mouth once a week. Start taking on:  Feb 23, 2019   folic acid 1 MG tablet Commonly known as:  FOLVITE Take 1 tablet (1 mg total) by mouth daily.   furosemide 40 MG tablet Commonly known as:  LASIX Take 1 tablet (40 mg total) by mouth daily for 30 days.   lactulose 10 GM/15ML solution Commonly known as:  CHRONULAC Take 15 mLs (10 g total) by mouth daily.   Magnesium 250 MG Tabs Take 250 mg by mouth daily.   multivitamin tablet Take 1 tablet by mouth daily.   omeprazole 20 MG capsule Commonly known as:  PRILOSEC TAKE ONE CAPSULE BY MOUTH TWICE DAILY BEFORE A MEAL What changed:    how much to take  how to take this  when to take this  additional instructions   propranolol 10 MG tablet Commonly known as:  INDERAL Take 1 tablet (10 mg total) by mouth 3 (three) times daily for 30 days.   spironolactone 50 MG tablet Commonly known as:  ALDACTONE Take 1 tablet (50 mg total) by mouth daily for 30 days.   thiamine 100 MG tablet Take 1 tablet (100 mg total) by mouth daily.      Follow-up Information    [facility], [place]. Go on 02/23/2019.   Specialty:  General Surgery Why:  Your appointment is 5/28 at 1:45pm. Please arrive 30 minutes prior to your appointment to check in and fill  out paperwork. Bring photo ID and insurance information. Contact information: [address] [place] [address] [phone]       Gastroenterology in [place]. Schedule an appointment as soon as possible for a visit in 2 week(s).          No Known Allergies  Consultations:  [facility]  [facility]   Procedures/Studies: Dg Chest 2 View  Result Date: 01/31/2019 CLINICAL DATA:  Shortness of breath EXAM: CHEST - 2 VIEW COMPARISON:  Yesterday FINDINGS: Unchanged small or moderate right pleural effusion with opacified underlying lung. Comparatively clear left chest. Stable generous heart size. IMPRESSION: Stable right pleural effusion and lower lobe opacification. Electronically Signed   By: Marnee Spring M.D.   On: 01/31/2019 09:23   Dg Chest 2 View  Result Date: 01/30/2019 CLINICAL DATA:  Chest tube removal. History of right lower lobe pneumonia and empyema. EXAM: CHEST - 2 VIEW COMPARISON:  Chest x-ray from yesterday. FINDINGS: Stable cardiomediastinal silhouette. Normal pulmonary vascularity. Persistent moderate loculated right pleural effusion and right lower lobe consolidation. Minimal atelectasis at the left lung base. No pneumothorax. No acute osseous abnormality. IMPRESSION: 1. Persistent moderate loculated right pleural effusion and right lower lobe pneumonia. Electronically Signed  By: Obie Dredge M.D.   On: 01/30/2019 08:15   Ct Chest Wo Contrast  Result Date: 01/21/2019 CLINICAL DATA:  Right lower lobe opacity and complex pleural fluid seen on prior abdominal CT. EXAM: CT CHEST WITHOUT CONTRAST TECHNIQUE: Multidetector CT imaging of the chest was performed following the standard protocol without IV contrast. COMPARISON:  CT abdomen 01/21/2019 FINDINGS: Cardiovascular: Heart is normal size. Aorta is normal caliber. Coronary artery calcification in the left anterior descending coronary artery.  Mediastinum/Nodes: No mediastinal, hilar, or axillary adenopathy. Lungs/Pleura: Dense consolidation noted in the right lower lobe compatible with pneumonia. Loculated pleural fluid noted along the right heart border and superior laterally in the right hemithorax. There is a rounded fluid collection posteriorly in the right lower hemithorax measuring 7.2 cm concerning for empyema or pulmonary abscess. On coronal imaging, this extends over a craniocaudal length of 10 cm. Dependent atelectasis in the left lower lobe. Upper Abdomen: Changes of cirrhosis with ascites. Musculoskeletal: No acute bony abnormality. Chest wall soft tissues are unremarkable. IMPRESSION: Dense consolidation in the right lower lobe compatible with pneumonia. Loculated right pleural fluid. Rounded fluid collection posteriorly in the right lower hemithorax which could be loculated fluid and reflect empyema or pulmonary abscess. Coronary artery disease. Cirrhosis, ascites. Electronically Signed   By: Charlett Nose M.D.   On: 01/21/2019 20:28   Ct Chest W Contrast  Result Date: 02/06/2019 CLINICAL DATA:  Follow-up pneumonia EXAM: CT CHEST WITH CONTRAST TECHNIQUE: Multidetector CT imaging of the chest was performed during intravenous contrast administration. CONTRAST:  75mL OMNIPAQUE IOHEXOL 300 MG/ML  SOLN COMPARISON:  CT chest, 01/21/2019, CT-guided drain placement, 01/25/2019 FINDINGS: Cardiovascular: No significant vascular findings. Normal heart size. No pericardial effusion. Mediastinum/Nodes: No enlarged mediastinal, hilar, or axillary lymph nodes. Thyroid gland, trachea, and esophagus demonstrate no significant findings. Lungs/Pleura: Small left, moderate right pleural effusions with associated atelectasis or consolidation. There remains a low-attenuation collection in the dependent right pleural space, likely with adjacent pleural thickening. This is essentially unchanged in size compared to examination dated 01/21/2019, measuring  approximately 6.7 x 4.8 cm. Upper Abdomen: No acute abnormality. Ascites, cirrhotic morphology of the liver, and partially imaged splenomegaly. Musculoskeletal: No chest wall mass or suspicious bone lesions identified. IMPRESSION: 1. Small left, moderate right pleural effusions with associated atelectasis or consolidation. There remains a low-attenuation collection in the dependent right pleural space, likely with adjacent pleural thickening. This is essentially unchanged in size compared to examination dated 01/21/2019, measuring approximately 6.7 x 4.8 cm. Differential considerations include chronic, loculated pleural fluid, abscess, or hematoma. 2.  Stigmata of cirrhosis in the partially included upper abdomen. Electronically Signed   By: Lauralyn Primes M.D.   On: 02/06/2019 10:52   Ct Abdomen Pelvis W Contrast  Result Date: 02/05/2019 CLINICAL DATA:  Abdominal pain, umbilical hernia EXAM: CT ABDOMEN AND PELVIS WITH CONTRAST TECHNIQUE: Multidetector CT imaging of the abdomen and pelvis was performed using the standard protocol following bolus administration of intravenous contrast. CONTRAST:  OMNIPAQUE IOHEXOL 300 MG/ML  SOLN COMPARISON:  01/21/2019 FINDINGS: Lower chest: Redemonstrated right pleural effusion with high density, likely loculated fluid collection of the dependent pleural space similar in size to prior examination, following drainage procedure. Associated atelectasis or consolidation of the right lower lobe. Small left pleural effusion. Hepatobiliary: Cirrhotic morphology of the liver. Tiny gallstone in the bladder. No gallbladder wall thickening, or biliary dilatation. Pancreas: Unremarkable. No pancreatic ductal dilatation or surrounding inflammatory changes. Spleen: Splenomegaly. Adrenals/Urinary Tract: Adrenal glands are unremarkable. Kidneys are  normal, without renal calculi, focal lesion, or hydronephrosis. Bladder is unremarkable. Stomach/Bowel: Stomach is within normal limits. No  evidence of bowel wall thickening, distention, or inflammatory changes.The small bowel is generally decompressed or air and fluid-filled without significant distention. Largest small bowel loops measure 3.1 cm in caliber. Vascular/Lymphatic: No significant vascular findings are present. No enlarged abdominal or pelvic lymph nodes. Reproductive: No mass or other abnormality. Other: Redemonstrated small bowel and fluid containing umbilical hernia. Small volume ascites. Musculoskeletal: No acute or significant osseous findings. IMPRESSION: 1. Redemonstrated small bowel and fluid containing umbilical hernia. The small bowel is generally decompressed or air and fluid-filled without significant distention. Lack of obstruction by CT does not exclude incarceration or strangulation if clinically suspected. 2.  Small volume ascites. 3.  Stigmata of cirrhosis and splenomegaly. 4. Redemonstrated right pleural effusion with high density, likely loculated fluid collection of the dependent pleural space similar in size to prior examination, following drainage procedure. Associated atelectasis or consolidation of the right lower lobe. Small left pleural effusion. Electronically Signed   By: Lauralyn Primes M.D.   On: 02/05/2019 18:04   Ct Abdomen Pelvis W Contrast  Result Date: 01/21/2019 CLINICAL DATA:  Coughing since January. History of thrombocytopenia, splenomegaly and cirrhosis. Recently diagnosed with pneumonia. EXAM: CT ABDOMEN AND PELVIS WITH CONTRAST TECHNIQUE: Multidetector CT imaging of the abdomen and pelvis was performed using the standard protocol following bolus administration of intravenous contrast. CONTRAST:  OMNIPAQUE IOHEXOL 300 MG/ML  SOLN COMPARISON:  Chest radiographs today. Abdominopelvic CT 05/08/2016. Right upper quadrant ultrasound 06/30/2018. FINDINGS: Lower chest: The lung bases are incompletely visualized. There is consolidation of the visualized right lower lobe with a large loculated right  pleural effusion. This has a component posteriorly with thick peripheral enhancement, measuring up to 7.9 x 5.2 cm on image 9/2. Based on the provided history, this is suspicious for empyema and possible pleural-based abscess. There is minimal atelectasis at the left lung base and no significant left pleural effusion. The azygos and hemi azygous veins are mildly dilated. Hepatobiliary: There are progressive changes of cirrhosis throughout the liver with diffuse contour irregularity and parenchymal heterogeneity. No dominant mass or focally abnormal enhancement identified. The portal vein is patent. No evidence of gallstones, gallbladder wall thickening or biliary dilatation. Pancreas: Unremarkable. No pancreatic ductal dilatation or surrounding inflammatory changes. Spleen: Measures approximately 14.7 x 7.5 x 12.3 cm (volume = 710 cm^3) consistent with mild splenomegaly. No focal abnormality. Adrenals/Urinary Tract: Both adrenal glands appear normal. The kidneys, ureters and bladder appear normal. Stomach/Bowel: No evidence of bowel wall thickening, distention or surrounding inflammatory change. There is a large amount of stool within the rectum. There is a probable loop of small bowel extending into an umbilical hernia. No evidence of bowel obstruction or incarceration. Vascular/Lymphatic: There are no enlarged abdominal or pelvic lymph nodes. Minimal aortic atherosclerosis. No acute vascular findings are identified. 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 The portal, superior mesenteric and splenic veins appear patent. Reproductive: The prostate gland and seminal vesicles appear normal. Other: Interval development of a moderate amount of ascites. Fluid extends into an umbilical hernia which measures up to 8.6 cm transverse on image 73/2. As above, there is air within this hernia as well which appears to be due to a small knuckle of small bowel. Musculoskeletal: No acute osseous  findings. There are old rib fractures on the left. There are changes of bilateral femoral head avascular necrosis with early subchondral collapse on the right.  There is edema throughout the subcutaneous fat. In the upper right buttocks, there is a globular area of increased signal which could be related to prior trauma/injection or reflect fat necrosis. IMPRESSION: 1. Right lower lobe pulmonary consolidation with complex fluid in the right pleural space, worrisome for empyema and pleural-based abscess. Full chest CT may be helpful for further evaluation. 2. Significantly progressive changes of diffuse hepatic cirrhosis compared with previous CT from 2017. Mild changes of portal hypertension with splenomegaly and venous collaterals. The main portal vein appears patent. 3. Progressive moderate volume ascites. Ascites and a small bowel loop extend into an umbilical hernia. No evidence of bowel obstruction or incarceration. 4. Chronic bilateral femoral head avascular necrosis. Electronically Signed   By: Carey BullocksWilliam  Veazey M.D.   On: 01/21/2019 19:37   [place] Renal  Result Date: 01/23/2019 CLINICAL DATA:  Renal failure EXAM: RENAL / URINARY TRACT ULTRASOUND COMPLETE COMPARISON:  None. FINDINGS: Right Kidney: Renal measurements: 12.4 x 6.8 x 6.4 cm = volume: 286 mL . Echogenicity within normal limits. No mass or hydronephrosis visualized. Left Kidney: Renal measurements: 11.8 x 6 x 6.5 cm = volume: 239 mL. Echogenicity within normal limits. No mass or hydronephrosis visualized. Bladder: Appears normal for degree of bladder distention. Other: Increased hepatic echogenicity with a slightly nodular contour as can be seen with hepatic cirrhosis. Small amount of perihepatic ascites. Splenomegaly with the spleen measuring 18.5 cm in length and a volume of 1535 mL. IMPRESSION: 1. Normal renal ultrasound. 2. Increased hepatic echogenicity with a slightly nodular contour as can be seen with hepatic cirrhosis. Small volume  perihepatic ascites. Splenomegaly. Correlate with liver function test. Electronically Signed   By: Elige KoHetal  Patel   On: 01/23/2019 10:06   Dg Chest Portable 1 View  Result Date: 02/05/2019 CLINICAL DATA:  38 year old male with abdominal pain for 1 day. Cirrhosis. Right lung pneumonia and loculated pleural effusion last month, pleural drain placed with CT guidance on 01/25/2019. EXAM: PORTABLE CHEST 1 VIEW COMPARISON:  Chest radiographs 01/31/2019 and earlier. FINDINGS: Portable AP semi upright view at 1524 hours. Stable veiling opacity in the right lower lung. Stable lung volumes and mediastinal contours. Visualized tracheal air column is within normal limits. No pneumothorax or pulmonary edema. No confluent opacity in the left lung. No acute osseous abnormality identified. IMPRESSION: Stable right pleural effusion since 01/31/2019. No new cardiopulmonary abnormality. Electronically Signed   By: Odessa FlemingH  Hall M.D.   On: 02/05/2019 15:33   Dg Chest Port 1 View  Result Date: 01/29/2019 CLINICAL DATA:  Right chest tube removal EXAM: PORTABLE CHEST 1 VIEW COMPARISON:  Chest radiograph from one day prior. FINDINGS: Low lung volumes. Interval removal of basilar right chest tube. Stable cardiomediastinal silhouette with top-normal heart size. No pneumothorax. No left pleural effusion. Stable blunting of the right costophrenic angle. No overt pulmonary edema. Patchy right lung base opacity is unchanged. IMPRESSION: 1. No pneumothorax status post right chest tube removal. 2. Low lung volumes with stable patchy right lung base opacity and blunting of the right costophrenic angle, which could represent residual small pleural effusions/pleural thickening. Continued chest radiograph follow-up buys. Electronically Signed   By: Delbert PhenixJason A Poff M.D.   On: 01/29/2019 14:11   Dg Chest Port 1 View  Result Date: 01/28/2019 CLINICAL DATA:  Empyema of pleural space EXAM: PORTABLE CHEST 1 VIEW COMPARISON:  Yesterday FINDINGS: Pigtail  catheter over the right base is unchanged. Unchanged pleural fluid and parenchymal opacity. Comparatively clear left chest. Normal heart size. IMPRESSION: Unchanged pleuroparenchymal  opacity at the right base. Electronically Signed   By: Marnee Spring M.D.   On: 01/28/2019 09:24   Dg Chest Port 1 View  Result Date: 01/27/2019 CLINICAL DATA:  Empyema. EXAM: PORTABLE CHEST 1 VIEW COMPARISON:  Radiograph of January 26, 2019. FINDINGS: The heart size and mediastinal contours are within normal limits. Left lung is clear. Stable position of right-sided pleural drainage catheter is noted. Right pleural effusion is unchanged. There is associated atelectasis in right lung base. No pneumothorax is noted. The visualized skeletal structures are unremarkable. IMPRESSION: Stable position of right-sided pleural drainage catheter with stable right pleural effusion and associated atelectasis. Electronically Signed   By: Lupita Raider M.D.   On: 01/27/2019 07:13   Dg Chest Port 1 View  Result Date: 01/26/2019 CLINICAL DATA:  Chest tube EXAM: PORTABLE CHEST 1 VIEW COMPARISON:  01/21/2019 FINDINGS: Right basilar chest tube is now in place. Right pleural effusion and basilar pulmonary opacity improved. Increasing small left pleural effusion. Upper lungs clear. Cardiomegaly. No pneumothorax. IMPRESSION: Improved right pleural effusion after chest tube placement. New small left pleural effusion. Electronically Signed   By: Jolaine Click M.D.   On: 01/26/2019 08:00   Dg Chest Portable 1 View  Result Date: 01/21/2019 CLINICAL DATA:  38 year old with current history of hepatic cirrhosis and, by the patient's report, recent diagnosis of pneumonia at an outside facility. EXAM: PORTABLE CHEST 1 VIEW COMPARISON:  02/21/2018 and earlier, including CTA chest 05/13/2015. FINDINGS: Cardiac silhouette moderately to markedly enlarged, unchanged since 2019 but increased in size since 2016. Pulmonary venous hypertension with perhaps minimal  interstitial pulmonary edema. Large RIGHT pleural effusion and associated consolidation in the RIGHT LOWER LOBE. Lungs otherwise clear. No visible LEFT pleural effusion. IMPRESSION: 1. Large RIGHT pleural effusion and associated passive atelectasis and/or pneumonia involving the RIGHT LOWER LOBE. 2. Stable moderate to marked cardiomegaly. Pulmonary venous hypertension with perhaps minimal interstitial pulmonary edema indicating CHF and/or fluid overload. Electronically Signed   By: Hulan Saas M.D.   On: 01/21/2019 16:25   Ct Image Guided Fluid Drain By Catheter  Result Date: 01/25/2019 INDICATION: 38 year old with right lung pneumonia, loculated right pleural fluid and a focal collection in the posterior lower chest. Findings are concerning for an empyema or lung abscess. Plan for percutaneous drainage of this posterior right chest fluid collection. EXAM: CT-GUIDED PLACEMENT OF RIGHT CHEST TUBE MEDICATIONS: No antibiotics were given for this procedure. ANESTHESIA/SEDATION: Fentanyl 50 mcg IV; Versed 1.5 mg IV Moderate Sedation Time:  41 minutes The patient was continuously monitored during the procedure by the interventional radiology nurse under my direct supervision. COMPLICATIONS: None immediate. PROCEDURE: Informed written consent was obtained from the patient after a thorough discussion of the procedural risks, benefits and alternatives. All questions were addressed. Maximal Sterile Barrier Technique was utilized including caps, mask, sterile gowns, sterile gloves, sterile drape, hand hygiene and skin antiseptic. A timeout was performed prior to the initiation of the procedure. Patient was placed prone. CT images through the lower chest were obtained. The right side of the chest was prepped and draped in sterile fashion. Skin and soft tissues anesthetized with 1% lidocaine. Using CT guidance, a Yueh catheter was directed into the right posterior lower chest fluid collection. Initially, thick yellow  fluid was aspirated. Stiff Amplatz wire was advanced into the collection and the patient started to cough and became symptomatic. The wire was removed for the Yueh catheter again. Additional bloody fluid was removed from the chest collection. Eventually, the patient's  coughing stopped. Subsequently, Yueh catheter was removed over a stiff Amplatz wire and the percutaneous tract was dilated to accommodate a 12 [place] drain. Approximately 100 mL of thick bloody purulent fluid was removed from the chest collection. The tube was starting to clot and the tube was irrigated with normal saline. The tube was attached to a PleurEvac and wall suction. Additional bloody fluid was removed. Catheter was sutured to skin and a bandage was placed. FINDINGS: Again noted is a complex fluid collection in the posteromedial right lower chest adjacent to right lung consolidation. Catheter was directed into the fluid collection and thick bloody purulent fluid was removed. 12 French drain was placed. IMPRESSION: CT-guided placement of a 12 French drain within the collection in the posterior right lower chest. Findings are concerning for a lung abscess or empyema. Electronically Signed   By: Richarda Overlie M.D.   On: 01/25/2019 17:42   Ir Paracentesis  Result Date: 01/23/2019 INDICATION: Patient with history of alcoholic cirrhosis with recurrent ascites. Request is made for diagnostic and therapeutic paracentesis. EXAM: ULTRASOUND GUIDED DIAGNOSTIC AND THERAPEUTIC PARACENTESIS MEDICATIONS: 10 mL 1% lidocaine COMPLICATIONS: None immediate. PROCEDURE: Informed written consent was obtained from the patient after a discussion of the risks, benefits and alternatives to treatment. A timeout was performed prior to the initiation of the procedure. Initial ultrasound scanning demonstrates a moderate amount of ascites within the right lower abdominal quadrant. The right lower abdomen was prepped and draped in the usual sterile fashion. 1% lidocaine  was used for local anesthesia. Following this, a 19 gauge, 7-cm, Yueh catheter was introduced. An ultrasound image was saved for documentation purposes. The paracentesis was performed. The catheter was removed and a dressing was applied. The patient tolerated the procedure well without immediate post procedural complication. FINDINGS: A total of approximately 2.2 L of clear yellow fluid was removed. Samples were sent to the laboratory as requested by the clinical team. IMPRESSION: Successful ultrasound-guided paracentesis yielding 2.2 L of peritoneal fluid. Read by: Elwin Mocha, PA-C Electronically Signed   By: Richarda Overlie M.D.   On: 01/23/2019 15:46      Subjective: Patient seen and examined at bedside, tolerating diet.  Requests when he can discharge home.  Also with general surgery and gastroenterology, okay for discharge given he can tolerate diet.  Continue antibiotics with Augmentin until completes course from previous hospitalization and start ciprofloxacin for SBP prophylaxis per GI.  Patient denies headache, no fever/chills/night sweats, no nausea/vomiting/diarrhea, no abdominal pain, no weakness, no chest pain, no shortness of breath.  No acute events overnight per nursing staff.   Discharge Exam: Vitals:   02/08/19 0538 02/08/19 0840  BP: 125/68 121/72  Pulse: 67 86  Resp: 18 18  Temp: 98 F (36.7 C) 98 F (36.7 C)  SpO2: 91% 97%   Vitals:   02/07/19 1800 02/07/19 2031 02/08/19 0538 02/08/19 0840  BP: 126/67 123/60 125/68 121/72  Pulse: 78 77 67 86  Resp: 18 18 18 18   Temp: 97.9 F (36.6 C) 98 F (36.7 C) 98 F (36.7 C) 98 F (36.7 C)  TempSrc: Oral Oral  Oral  SpO2: 97% 93% 91% 97%  Weight:      Height:        General: Pt is alert, awake, not in acute distress Cardiovascular: RRR, S1/S2 +, no rubs, no gallops Respiratory: CTA bilaterally, no wheezing, no rhonchi Abdominal: Soft, NT, mild distention, bowel sounds + Extremities: no edema, no cyanosis    The  results of  significant diagnostics from this hospitalization (including imaging, microbiology, ancillary and laboratory) are listed below for reference.     Microbiology: Recent Results (from the past 240 hour(s))  Blood culture (routine x 2)     Status: None (Preliminary result)   Collection Time: 02/05/19  8:45 PM  Result Value Ref Range Status   Specimen Description BLOOD RIGHT ARM  Final   Special Requests   Final    BOTTLES DRAWN AEROBIC AND ANAEROBIC Blood Culture adequate volume   Culture   Final    NO GROWTH 2 DAYS Performed at [facility], [address], [place], [place] [address]    Report Status PENDING  Incomplete  Blood culture (routine x 2)     Status: None (Preliminary result)   Collection Time: 02/05/19  8:53 PM  Result Value Ref Range Status   Specimen Description BLOOD LEFT LOWER ARM  Final   Special Requests   Final    BOTTLES DRAWN AEROBIC AND ANAEROBIC Blood Culture results may not be optimal due to an excessive volume of blood received in culture bottles   Culture   Final    NO GROWTH 2 DAYS Performed at [facility], [address], [place], [place] [address]    Report Status PENDING  Incomplete  SARS Coronavirus 2 (CEPHEID - Performed in [facility] hospital lab), Hosp Order     Status: None   Collection Time: 02/05/19  9:09 PM  Result Value Ref Range Status   SARS Coronavirus 2 NEGATIVE NEGATIVE Final    Comment: (NOTE) If result is NEGATIVE SARS-CoV-2 target nucleic acids are NOT DETECTED. The SARS-CoV-2 RNA is generally detectable in upper and lower  respiratory specimens during the acute phase of infection. The lowest  concentration of SARS-CoV-2 viral copies this assay can detect is 250  copies / mL. A negative result does not preclude SARS-CoV-2 infection  and should not be used as the sole basis for treatment or other  patient management decisions.  A negative result may occur with  improper specimen collection / handling,  submission of specimen other  than nasopharyngeal swab, presence of viral mutation(s) within the  areas targeted by this assay, and inadequate number of viral copies  (<250 copies / mL). A negative result must be combined with clinical  observations, patient history, and epidemiological information. If result is POSITIVE SARS-CoV-2 target nucleic acids are DETECTED. The SARS-CoV-2 RNA is generally detectable in upper and lower  respiratory specimens dur ing the acute phase of infection.  Positive  results are indicative of active infection with SARS-CoV-2.  Clinical  correlation with patient history and other diagnostic information is  necessary to determine patient infection status.  Positive results do  not rule out bacterial infection or co-infection with other viruses. If result is PRESUMPTIVE POSTIVE SARS-CoV-2 nucleic acids MAY BE PRESENT.   A presumptive positive result was obtained on the submitted specimen  and confirmed on repeat testing.  While 2019 novel coronavirus  (SARS-CoV-2) nucleic acids may be present in the submitted sample  additional confirmatory testing may be necessary for epidemiological  and / or clinical management purposes  to differentiate between  SARS-CoV-2 and other Sarbecovirus currently known to infect humans.  If clinically indicated additional testing with an alternate test  methodology [phone]) is advised. The SARS-CoV-2 RNA is generally  detectable in upper and lower respiratory sp ecimens during the acute  phase of infection. The expected result is Negative. Fact Sheet for Patients:  BoilerBrush.com.cy Fact Sheet for Healthcare Providers: https://pope.com/ This test is not yet approved or cleared by the [place] FDA and has been authorized for detection and/or diagnosis of SARS-CoV-2 by FDA under an Emergency Use Authorization (EUA).  This EUA will remain in effect (meaning this test can be  used) for the duration of the COVID-19 declaration under Section 564(b)(1) of the Act, 21 U.S.C. section 360bbb-3(b)(1), unless the authorization is terminated or revoked sooner. Performed at [facility], [address] [place], [place] [address]      Labs: BNP (last 3 results) Recent Labs    01/21/19 1648  BNP 318.0*   Basic Metabolic Panel: Recent Labs  Lab 02/01/19 1609 02/02/19 0239 02/05/19 1531 02/06/19 0612 02/07/19 0300 02/08/19 0519  NA 134*  --  134* 137 138 138  K 4.6  --  3.8 3.8 4.2 3.4*  CL 107  --  109 105 109 107  CO2 16*  --  18* 20* 17* 20*  GLUCOSE 91  --  97 101* 125* 108*  BUN 23*  --  10 6 11 12   CREATININE 0.95  --  0.64 0.70 0.82 0.88  CALCIUM 8.9  --  9.3 9.7 9.1 9.3  MG  --  1.6*  --   --   --  1.4*  PHOS  --   --   --   --   --  3.4   Liver Function Tests: Recent Labs  Lab 02/01/19 1609 02/05/19 1531 02/06/19 0612 02/07/19 0300 02/08/19 0519  AST 85* 78* 77* 59* 52*  ALT 30 31 31 26 26   ALKPHOS 96 87 87 72 67  BILITOT 3.8* 3.9* 4.1* 3.5* 2.7*  PROT 7.7 8.0 8.7* 7.5 8.1  ALBUMIN 2.2* 2.4* 2.3* 2.4* 2.5*   No results for input(s): LIPASE, AMYLASE in the last 168 hours. No results for input(s): AMMONIA in the last 168 hours. CBC: Recent Labs  Lab 02/02/19 0239 02/02/19 1136 02/05/19 1531 02/06/19 0612 02/07/19 0300 02/08/19 0519  WBC 9.8  --  5.1 5.7 5.3 5.7  NEUTROABS  --   --  3.2 3.8  --   --   HGB 7.1* 8.0* 7.9* 8.2* 7.3* 7.2*  HCT 22.2* 24.1* 24.7* 25.1* 22.9* 22.7*  MCV 105.2*  --  106.5* 104.6* 106.0* 107.1*  PLT 133*  --  143* 161 126* 140*   Cardiac Enzymes: No results for input(s): CKTOTAL, CKMB, CKMBINDEX, TROPONINI in the last 168 hours. BNP: Invalid input(s): POCBNP CBG: Recent Labs  Lab 02/07/19 1216 02/07/19 1805 02/08/19 0027 02/08/19 0704 02/08/19 1109  GLUCAP 130* 130* 112* 106* 81   D-Dimer No results for input(s): DDIMER in the last 72 hours. Hgb A1c No results for input(s):  HGBA1C in the last 72 hours. Lipid Profile No results for input(s): CHOL, HDL, LDLCALC, TRIG, CHOLHDL, LDLDIRECT in the last 72 hours. Thyroid function studies No results for input(s): TSH, T4TOTAL, T3FREE, THYROIDAB in the last 72 hours.  Invalid input(s): FREET3 Anemia work up No results for input(s): VITAMINB12, FOLATE, FERRITIN, TIBC, IRON, RETICCTPCT in the last 72 hours. Urinalysis    Component Value Date/Time   COLORURINE YELLOW 02/05/2019 1904   APPEARANCEUR CLEAR 02/05/2019 1904   LABSPEC 1.023 02/05/2019 1904   PHURINE 5.0 02/05/2019 1904   GLUCOSEU NEGATIVE 02/05/2019 1904   HGBUR LARGE (A) 02/05/2019 1904   BILIRUBINUR NEGATIVE 02/05/2019 1904   KETONESUR NEGATIVE 02/05/2019 1904   PROTEINUR NEGATIVE 02/05/2019 1904   NITRITE NEGATIVE 02/05/2019 1904  LEUKOCYTESUR NEGATIVE 02/05/2019 1904   Sepsis Labs Invalid input(s): PROCALCITONIN,  WBC,  LACTICIDVEN Microbiology Recent Results (from the past 240 hour(s))  Blood culture (routine x 2)     Status: None (Preliminary result)   Collection Time: 02/05/19  8:45 PM  Result Value Ref Range Status   Specimen Description BLOOD RIGHT ARM  Final   Special Requests   Final    BOTTLES DRAWN AEROBIC AND ANAEROBIC Blood Culture adequate volume   Culture   Final    NO GROWTH 2 DAYS Performed at [facility], [address] [place], [place] [address]    Report Status PENDING  Incomplete  Blood culture (routine x 2)     Status: None (Preliminary result)   Collection Time: 02/05/19  8:53 PM  Result Value Ref Range Status   Specimen Description BLOOD LEFT LOWER ARM  Final   Special Requests   Final    BOTTLES DRAWN AEROBIC AND ANAEROBIC Blood Culture results may not be optimal due to an excessive volume of blood received in culture bottles   Culture   Final    NO GROWTH 2 DAYS Performed at [facility], [address] [place], [place] [address]    Report Status PENDING  Incomplete  SARS Coronavirus 2 (CEPHEID  - Performed in [facility] hospital lab), Hosp Order     Status: None   Collection Time: 02/05/19  9:09 PM  Result Value Ref Range Status   SARS Coronavirus 2 NEGATIVE NEGATIVE Final    Comment: (NOTE) If result is NEGATIVE SARS-CoV-2 target nucleic acids are NOT DETECTED. The SARS-CoV-2 RNA is generally detectable in upper and lower  respiratory specimens during the acute phase of infection. The lowest  concentration of SARS-CoV-2 viral copies this assay can detect is 250  copies / mL. A negative result does not preclude SARS-CoV-2 infection  and should not be used as the sole basis for treatment or other  patient management decisions.  A negative result may occur with  improper specimen collection / handling, submission of specimen other  than nasopharyngeal swab, presence of viral mutation(s) within the  areas targeted by this assay, and inadequate number of viral copies  (<250 copies / mL). A negative result must be combined with clinical  observations, patient history, and epidemiological information. If result is POSITIVE SARS-CoV-2 target nucleic acids are DETECTED. The SARS-CoV-2 RNA is generally detectable in upper and lower  respiratory specimens dur ing the acute phase of infection.  Positive  results are indicative of active infection with SARS-CoV-2.  Clinical  correlation with patient history and other diagnostic information is  necessary to determine patient infection status.  Positive results do  not rule out bacterial infection or co-infection with other viruses. If result is PRESUMPTIVE POSTIVE SARS-CoV-2 nucleic acids MAY BE PRESENT.   A presumptive positive result was obtained on the submitted specimen  and confirmed on repeat testing.  While 2019 novel coronavirus  (SARS-CoV-2) nucleic acids may be present in the submitted sample  additional confirmatory testing may be necessary for epidemiological  and / or clinical management purposes  to differentiate between   SARS-CoV-2 and other Sarbecovirus currently known to infect humans.  If clinically indicated additional testing with an alternate test  methodology [phone]) is advised. The SARS-CoV-2 RNA is generally  detectable in upper and lower respiratory sp ecimens during the acute  phase of infection. The expected result is Negative. Fact Sheet for Patients:  BoilerBrush.com.cy Fact Sheet for  Healthcare Providers: https://pope.com/ This test is not yet approved or cleared by the [place] and has been authorized for detection and/or diagnosis of SARS-CoV-2 by FDA under an Emergency Use Authorization (EUA).  This EUA will remain in effect (meaning this test can be used) for the duration of the COVID-19 declaration under Section 564(b)(1) of the Act, 21 U.S.C. section 360bbb-3(b)(1), unless the authorization is terminated or revoked sooner. Performed at [facility], [address] [place], [place] [address]      Time coordinating discharge: Over 30 minutes  SIGNED:   Alvira Philips [place], DO  [facility] 02/08/2019, 11:21 AM Pager   If 7PM-7AM, please contact night-coverage [email or web address] Password [email or web address]

## 2019-02-08 NOTE — Progress Notes (Signed)
Subjective No acute events. Feeling great. Hungry. Denies n/v - tolerating liquids without issue  Objective: Vital signs in last 24 hours: Temp:  [97.9 F (36.6 C)-98 F (36.7 C)] 98 F (36.7 C) (05/13 0840) Pulse Rate:  [67-86] 86 (05/13 0840) Resp:  [18] 18 (05/13 0840) BP: (121-126)/(60-72) 121/72 (05/13 0840) SpO2:  [91 %-97 %] 97 % (05/13 0840) Last BM Date: 02/05/19  Intake/Output from previous day: 05/12 0701 - 05/13 0700 In: 1617.7 [P.O.:460; IV Piggyback:1157.7] Out: 500 [Urine:500] Intake/Output this shift: Total I/O In: 119.6 [P.O.:100; IV Piggyback:19.6] Out: 120 [Urine:120]  Gen: NAD, comfortable CV: RRR Pulm: Normal work of breathing Abd: Soft, NT. Not signficantly distended. Binder in place. Incision clean/dry, no erythema nor drainage Ext: SCDs in place  Lab Results: CBC  Recent Labs    02/07/19 0300 02/08/19 0519  WBC 5.3 5.7  HGB 7.3* 7.2*  HCT 22.9* 22.7*  PLT 126* 140*   BMET Recent Labs    02/07/19 0300 02/08/19 0519  NA 138 138  K 4.2 3.4*  CL 109 107  CO2 17* 20*  GLUCOSE 125* 108*  BUN 11 12  CREATININE 0.82 0.88  CALCIUM 9.1 9.3   PT/INR Recent Labs    02/06/19 0612 02/07/19 0300  LABPROT 17.3* 17.8*  INR 1.4* 1.5*   ABG No results for input(s): PHART, HCO3 in the last 72 hours.  Invalid input(s): PCO2, PO2  Studies/Results:  Anti-infectives: Anti-infectives (From admission, onward)   Start     Dose/Rate Route Frequency Ordered Stop   02/07/19 2100  vancomycin (VANCOCIN) IVPB 750 mg/150 ml premix     750 mg 150 mL/hr over 60 Minutes Intravenous Every 12 hours 02/07/19 1804     02/06/19 0600  vancomycin (VANCOCIN) 1,250 mg in sodium chloride 0.9 % 250 mL IVPB  Status:  Discontinued     1,250 mg 166.7 mL/hr over 90 Minutes Intravenous Every 12 hours 02/05/19 2335 02/07/19 1745   02/06/19 0600  piperacillin-tazobactam (ZOSYN) IVPB 3.375 g     3.375 g 12.5 mL/hr over 240 Minutes Intravenous Every 8 hours 02/05/19  2335     02/05/19 2100  vancomycin (VANCOCIN) 2,000 mg in sodium chloride 0.9 % 500 mL IVPB     2,000 mg 250 mL/hr over 120 Minutes Intravenous  Once 02/05/19 2031 02/06/19 0020   02/05/19 2100  piperacillin-tazobactam (ZOSYN) IVPB 4.5 g  Status:  Discontinued     4.5 g 200 mL/hr over 30 Minutes Intravenous  Once 02/05/19 2031 02/05/19 2101   02/05/19 2100  piperacillin-tazobactam (ZOSYN) IVPB 3.375 g     3.375 g 100 mL/hr over 30 Minutes Intravenous  Once 02/05/19 2101 02/05/19 2156       Assessment/Plan: Patient Active Problem List   Diagnosis Date Noted  . Obstructed umbilical hernia 02/05/2019  . Hernia, umbilical 02/05/2019  . Acute respiratory failure with hypoxia (HCC) 01/21/2019  . RLL pneumonia (HCC) 01/21/2019  . Empyema of right pleural space (HCC) 01/21/2019  . Hyponatremia 01/21/2019  . Supratherapeutic INR 01/21/2019  . Lactic acidosis 01/21/2019  . Portal vein thrombosis 08/02/2018  . Abdominal distension   . Community acquired pneumonia   . LLL pneumonia (HCC) 01/31/2018  . Symptomatic anemia 01/31/2018  . Hypomagnesemia 01/31/2018  . Hypokalemia 01/31/2018  . Esophageal reflux   . Morbid obesity due to excess calories (HCC)   . Dehydration 05/09/2016  . Alcoholic cirrhosis of liver with ascites (HCC) 05/09/2016  . Diarrhea 05/09/2016  . Campylobacter diarrhea 05/09/2016  . Alcohol  abuse 05/09/2016  . Hematochezia   . Thrombocytopenia (HCC) 09/29/2011  . Splenomegaly 09/29/2011  . Cirrhosis (HCC) 09/29/2011  . Ascites 09/29/2011   s/p Procedure(s): EXPLORATORY LAPAROTOMY WITH PRIMARY CLOSURE OF UMBLICAL HERNIA 02/06/2019  -Diet as per GI - ok with solids from our standpoint -Ambulate 5x/day -Continue abd binder -Will plan follow-up in our office for staple removal 5/28 -If tolerates diet, ok for discharge from surgical perspective   LOS: 3 days   Stephanie Couphristopher M. Cliffton AstersWhite, M.D. [facility]

## 2019-02-10 ENCOUNTER — Telehealth (HOSPITAL_COMMUNITY): Payer: Self-pay | Admitting: Hematology

## 2019-02-10 LAB — CULTURE, BLOOD (ROUTINE X 2)
Culture: NO GROWTH
Culture: NO GROWTH
Special Requests: ADEQUATE

## 2019-02-10 NOTE — Telephone Encounter (Signed)
RCVD REPLACEMENT FERAHEME ON 08/27/2018 FROM [facility]  FOR DOS 03/18/18 AND 07/14/18.

## 2019-07-10 ENCOUNTER — Other Ambulatory Visit: Payer: Self-pay | Admitting: Gastroenterology

## 2019-07-12 NOTE — Telephone Encounter (Signed)
Rx refill request. Looks like when patient was seen on January of this year, we were setting him up to see Dr. Claudie Leach in [place] for long term care. Has patient established with Dr. Claudie Leach? Dr. Claudie Leach needs to be the one to refill his medications if he is the patients primary GI doc.

## 2019-07-12 NOTE — Telephone Encounter (Signed)
LMOM for a return call.  

## 2019-07-13 NOTE — Telephone Encounter (Signed)
Called home number and it is no longer his number. Called mobile and left vm for a return call. [phone].

## 2019-07-13 NOTE — Telephone Encounter (Signed)
Letter mailed to pt.  

## 2020-06-28 DEATH — deceased

## 2020-07-22 IMAGING — CT CT ABDOMEN AND PELVIS WITH CONTRAST
2 of 5 series · 15 of 46 positions shown, 17 images · IV contrast (Isovue)
Comparison: Chest radiographs today. Abdominopelvic CT 05/08/2016.
Right upper quadrant ultrasound 06/30/2018.

CLINICAL DATA: Coughing since [REDACTED]. History of thrombocytopenia,
splenomegaly and cirrhosis. Recently diagnosed with pneumonia.

EXAM:
CT ABDOMEN AND PELVIS WITH CONTRAST
TECHNIQUE: Multidetector CT imaging of the abdomen and pelvis was performed
using the standard protocol following bolus administration of
intravenous contrast.
CONTRAST:  100mL OMNIPAQUE IOHEXOL 300 MG/ML  SOLN

[Series 2: axial st · axial · 0.96mm/px · z∈[+834,+1294]mm · 12 of 106 slices shown, 14 images]
[im 7/106  soft-tissue]
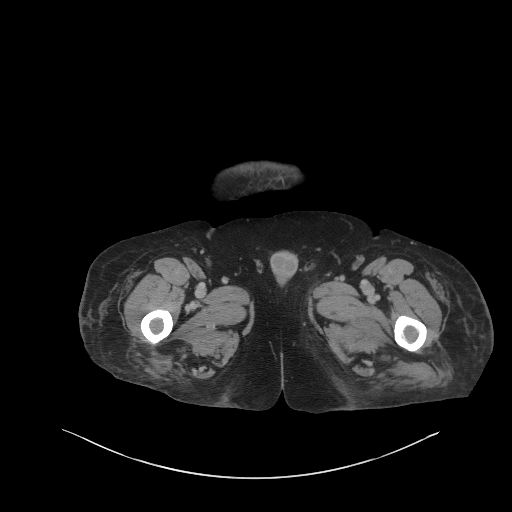
[im 7/106  bone]
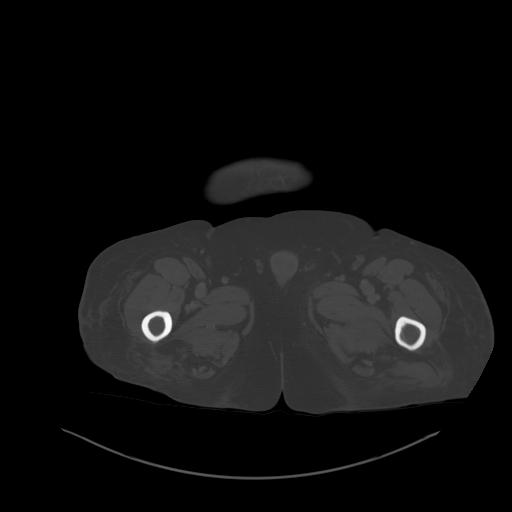
[im 19/106  soft-tissue]
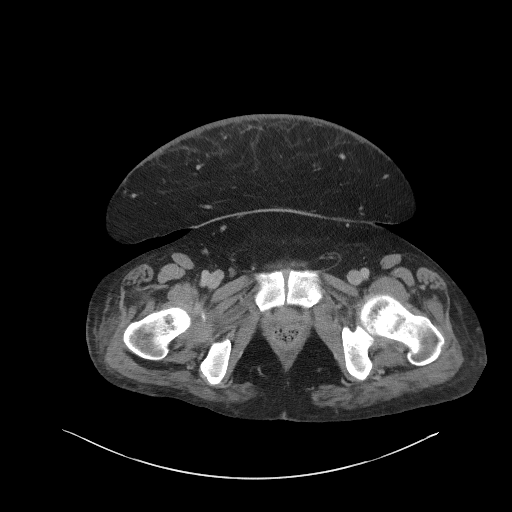
[im 25/106  soft-tissue]
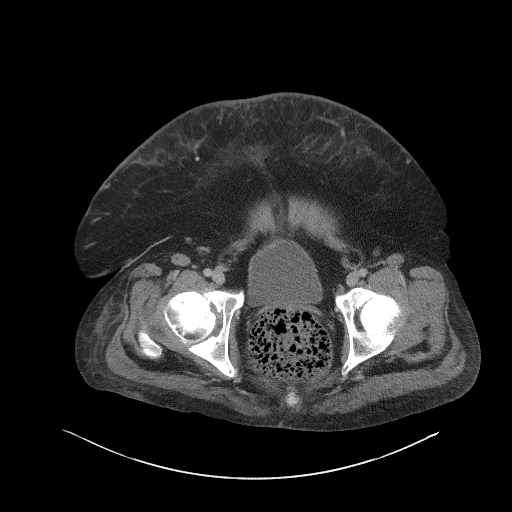
[im 31/106  soft-tissue]
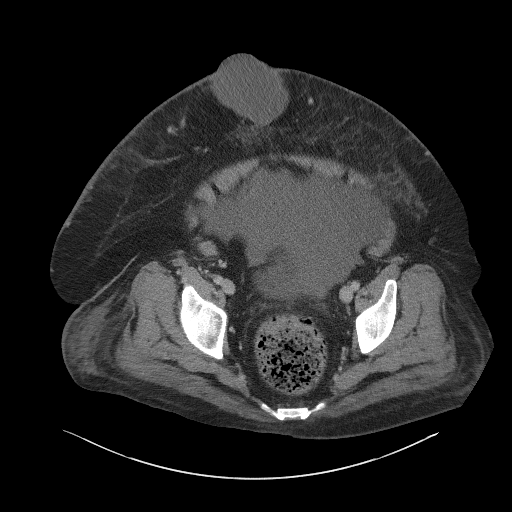
[im 44/106  soft-tissue]
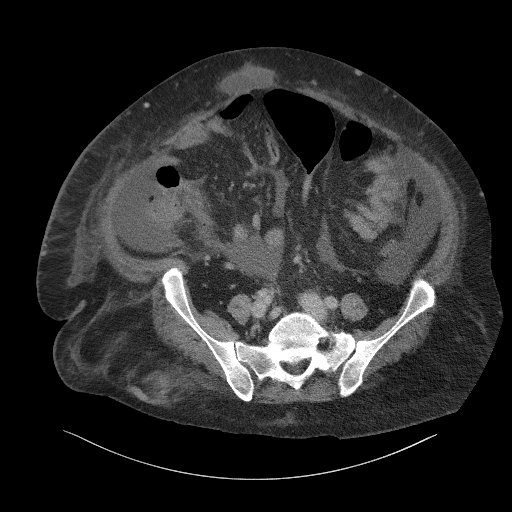
[im 50/106  soft-tissue]
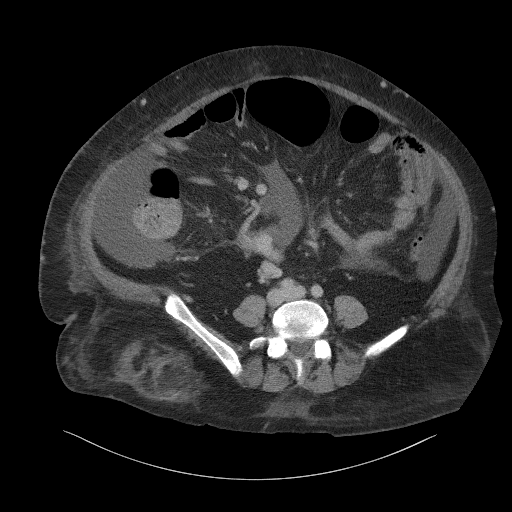
[im 56/106  soft-tissue]
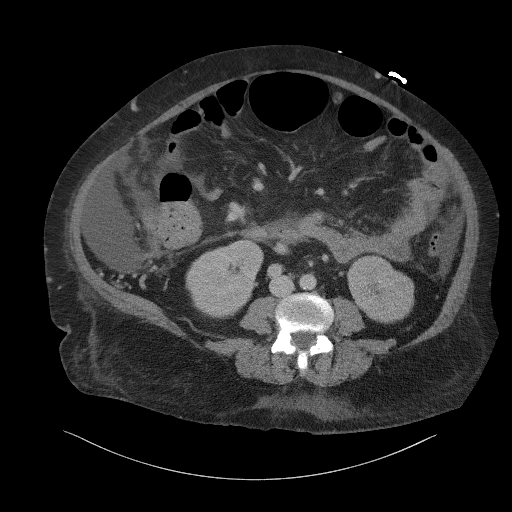
[im 68/106  soft-tissue]
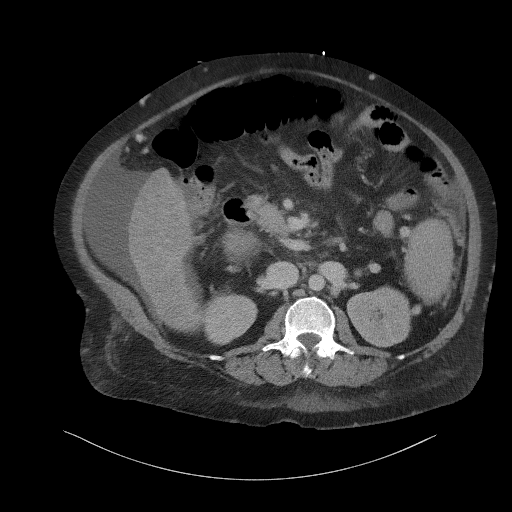
[im 75/106  soft-tissue]
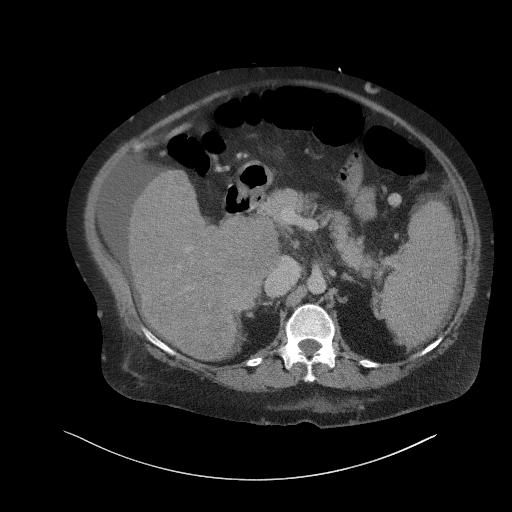
[im 75/106  bone]
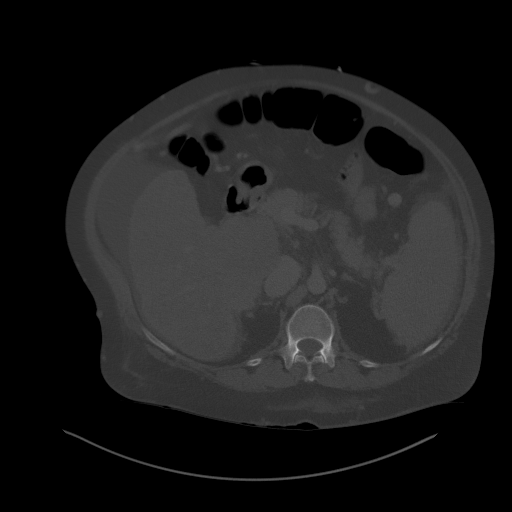
[im 81/106  soft-tissue]
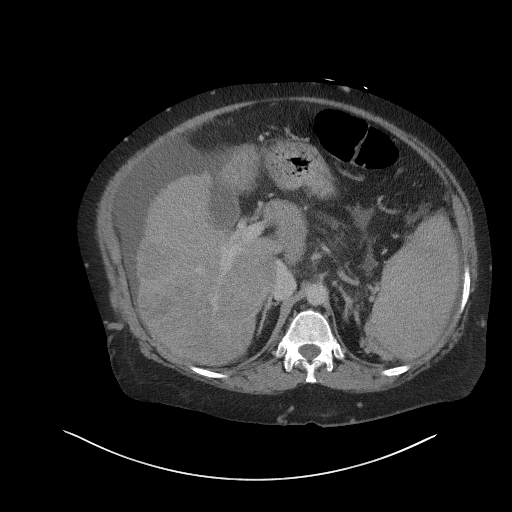
[im 93/106  soft-tissue]
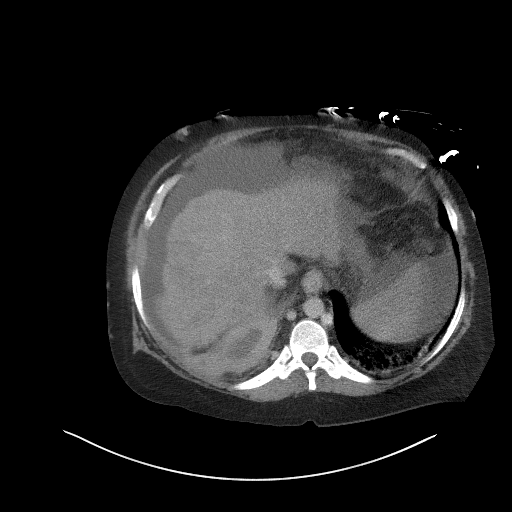
[im 99/106  soft-tissue]
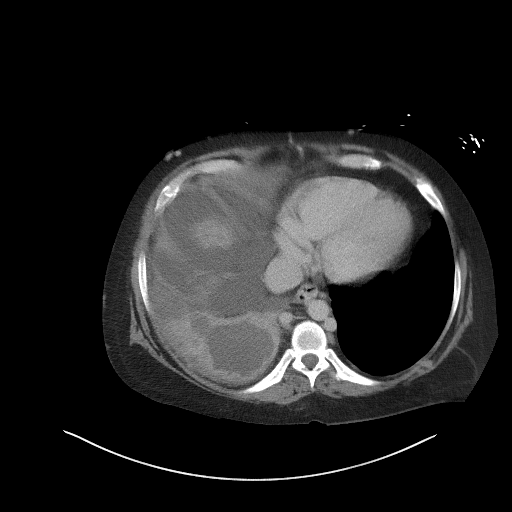

[Series 5: coronal st · coronal · 0.96mm/px · 3 of 126 slices shown]
[im 42/126  soft-tissue]
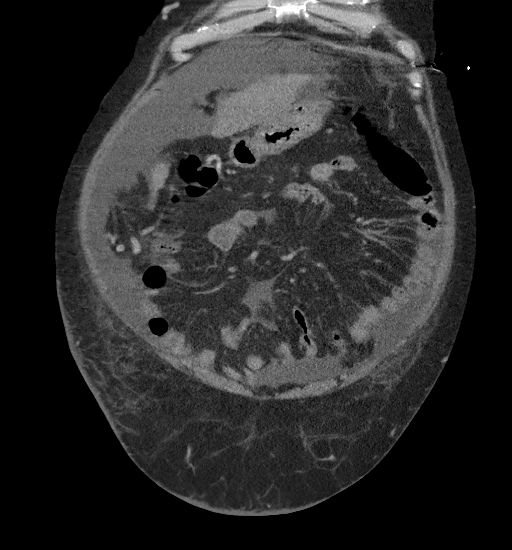
[im 56/126  soft-tissue]
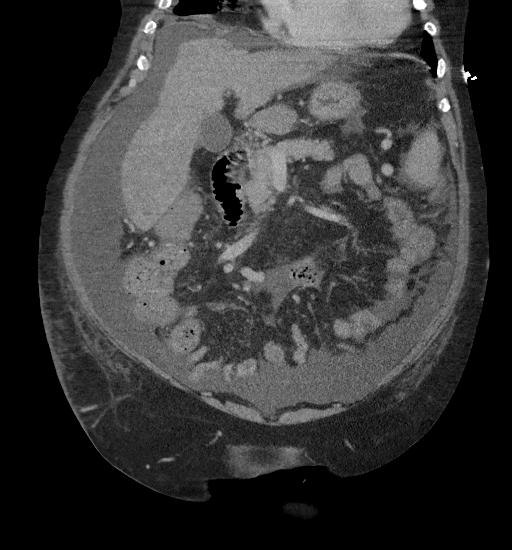
[im 70/126  soft-tissue]
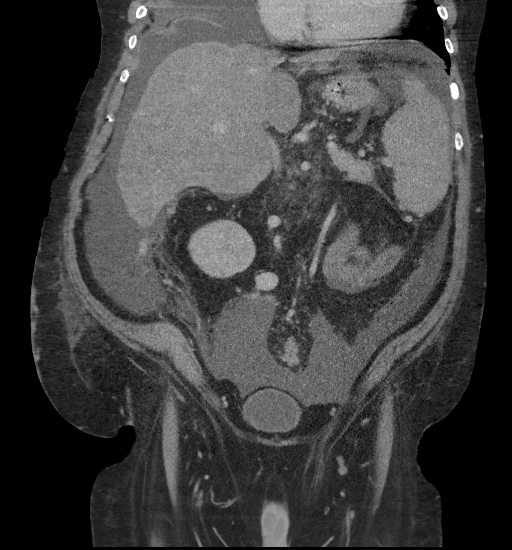

[15 of 46 positions shown; findings below may reference images not displayed]

FINDINGS: Lower chest: The lung bases are incompletely visualized. There is
consolidation of the visualized right lower lobe with a large
loculated right pleural effusion. This has a component posteriorly
with thick peripheral enhancement, measuring up to 7.9 x 5.2 cm on
image [DATE]. Based on the provided history, this is suspicious for
empyema and possible pleural-based abscess. There is minimal
atelectasis at the left lung base and no significant left pleural
effusion. The azygos and hemi azygous veins are mildly dilated.

Hepatobiliary: There are progressive changes of cirrhosis throughout
the liver with diffuse contour irregularity and parenchymal
heterogeneity. No dominant mass or focally abnormal enhancement
identified. The portal vein is patent. No evidence of gallstones,
gallbladder wall thickening or biliary dilatation.

Pancreas: Unremarkable. No pancreatic ductal dilatation or
surrounding inflammatory changes.

Spleen: Measures approximately 14.7 x 7.5 x 12.3 cm (volume = 710
cm^3) consistent with mild splenomegaly. No focal abnormality.

Adrenals/Urinary Tract: Both adrenal glands appear normal. The
kidneys, ureters and bladder appear normal.

Stomach/Bowel: No evidence of bowel wall thickening, distention or
surrounding inflammatory change. There is a large amount of stool
within the rectum. There is a probable loop of small bowel extending
into an umbilical hernia. No evidence of bowel obstruction or
incarceration.

Vascular/Lymphatic: There are no enlarged abdominal or pelvic lymph
nodes. Minimal aortic atherosclerosis. No acute vascular findings
are identified. There is a splenorenal shunt and multiple mesenteric
venous collaterals. The portal, superior mesenteric and splenic
veins appear patent.

Reproductive: The prostate gland and seminal vesicles appear normal.

Other: Interval development of a moderate amount of ascites. Fluid
extends into an umbilical hernia which measures up to 8.6 cm
transverse on image 73/2. As above, there is air within this hernia
as well which appears to be due to a small knuckle of small bowel.

Musculoskeletal: No acute osseous findings. There are old rib
fractures on the left. There are changes of bilateral femoral head
avascular necrosis with early subchondral collapse on the right.
There is edema throughout the subcutaneous fat. In the upper right
buttocks, there is a globular area of increased signal which could
be related to prior trauma/injection or reflect fat necrosis.
IMPRESSION: 1. Right lower lobe pulmonary consolidation with complex fluid in
the right pleural space, worrisome for empyema and pleural-based
abscess. Full chest CT may be helpful for further evaluation.
2. Significantly progressive changes of diffuse hepatic cirrhosis
compared with previous CT from 0106. Mild changes of portal
hypertension with splenomegaly and venous collaterals. The main
portal vein appears patent.
3. Progressive moderate volume ascites. Ascites and a small bowel
loop extend into an umbilical hernia. No evidence of bowel
obstruction or incarceration.
4. Chronic bilateral femoral head avascular necrosis.

## 2020-07-24 IMAGING — US IR PARACENTESIS
1 series · 2 of 2 positions shown · non-contrast
Comparison: none

INDICATION: Patient with history of alcoholic cirrhosis with recurrent ascites.
Request is made for diagnostic and therapeutic paracentesis.

[Series 1: ir (id) (id)/(id)/(id) ir · 2 of 2 slices shown]
[im 1/2]
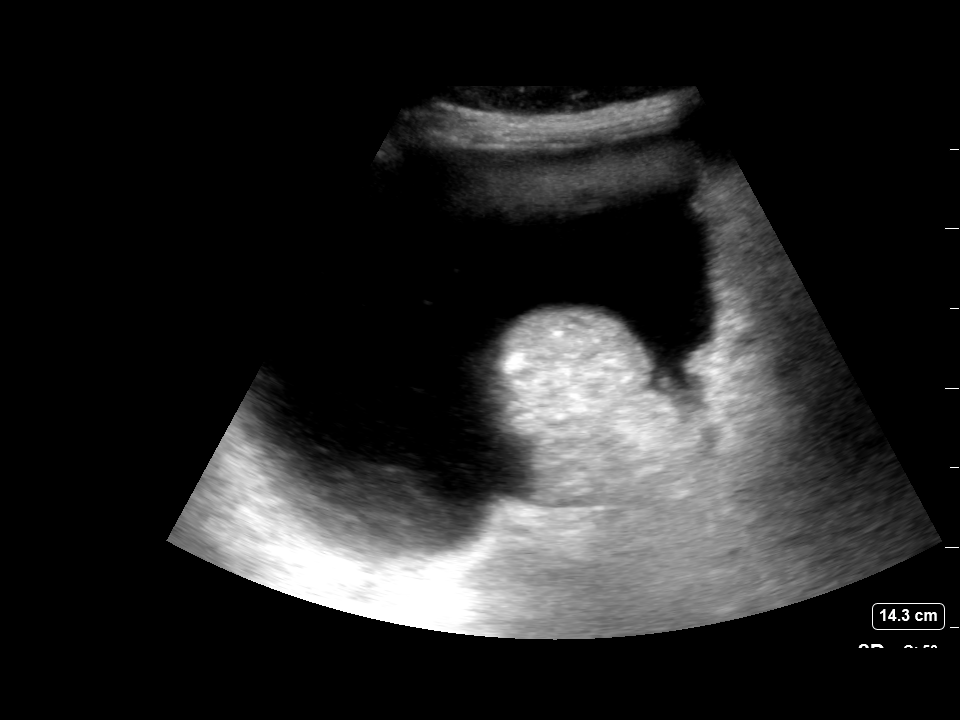
[im 2/2]
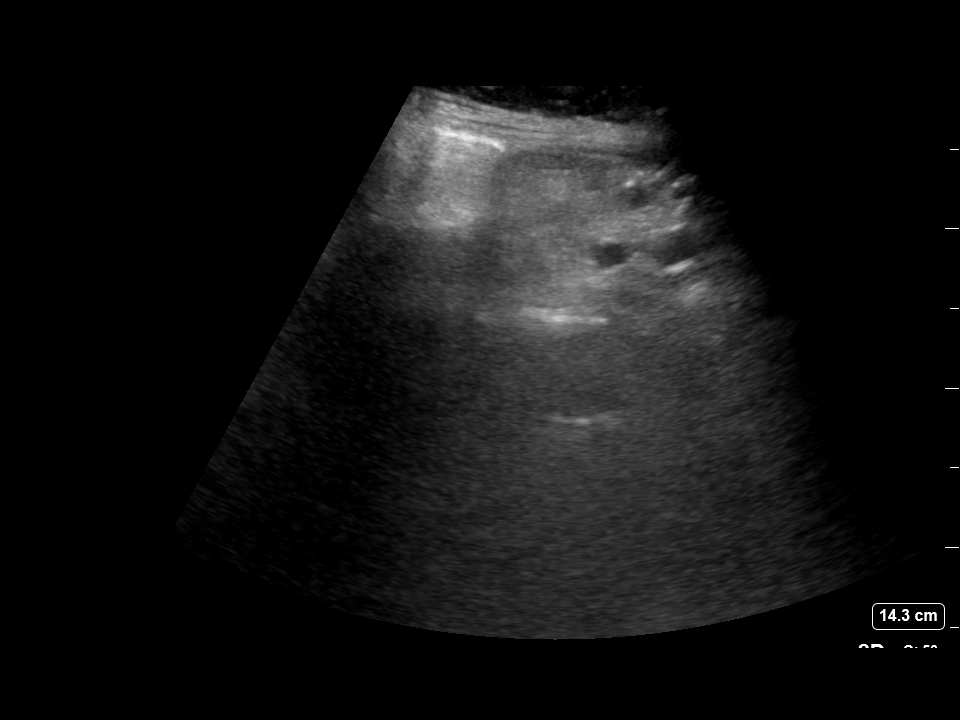

[2 of 2 positions shown; findings below may reference images not displayed]

EXAM:
ULTRASOUND GUIDED DIAGNOSTIC AND THERAPEUTIC PARACENTESIS

MEDICATIONS:
10 mL 1% lidocaine

COMPLICATIONS:
None immediate.

PROCEDURE:
Informed written consent was obtained from the patient after a
discussion of the risks, benefits and alternatives to treatment. A
timeout was performed prior to the initiation of the procedure.

Initial ultrasound scanning demonstrates a moderate amount of
ascites within the right lower abdominal quadrant. The right lower
abdomen was prepped and draped in the usual sterile fashion. 1%
lidocaine was used for local anesthesia.

Following this, a 19 gauge, 7-cm, Yueh catheter was introduced. An
ultrasound image was saved for documentation purposes. The
paracentesis was performed. The catheter was removed and a dressing
was applied. The patient tolerated the procedure well without
immediate post procedural complication.
FINDINGS: A total of approximately 2.2 L of clear yellow fluid was removed.
Samples were sent to the laboratory as requested by the clinical
team.
IMPRESSION: Successful ultrasound-guided paracentesis yielding 2.2 L of
peritoneal fluid.

## 2020-07-24 IMAGING — US US RENAL
1 series · 14 of 25 positions shown · non-contrast
Comparison: None.

CLINICAL DATA: Renal failure

EXAM:
RENAL / URINARY TRACT ULTRASOUND COMPLETE

[Series 1: us renal · 14 of 36 slices shown]
[im 1/36]
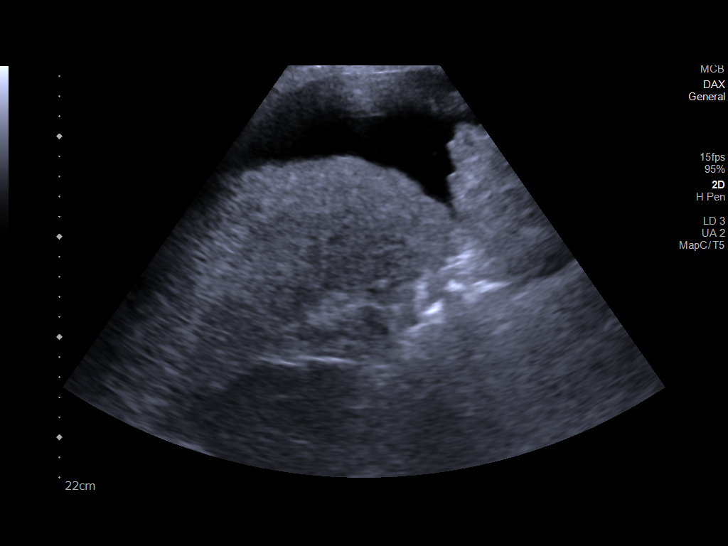
[im 3/36]
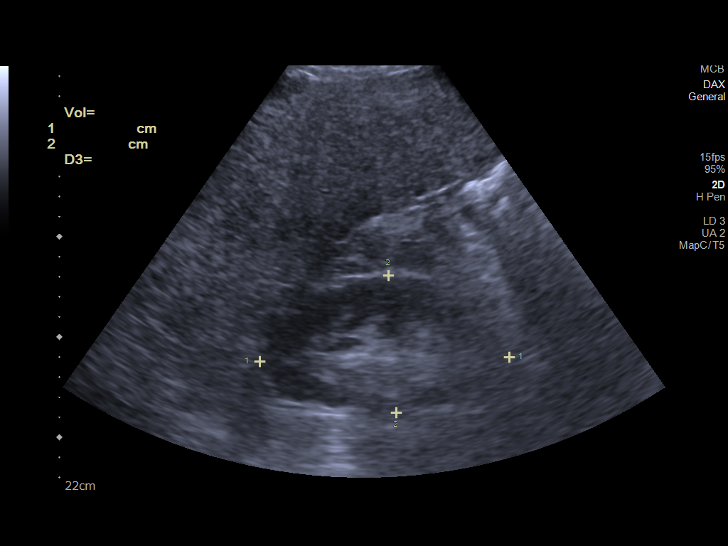
[im 6/36]
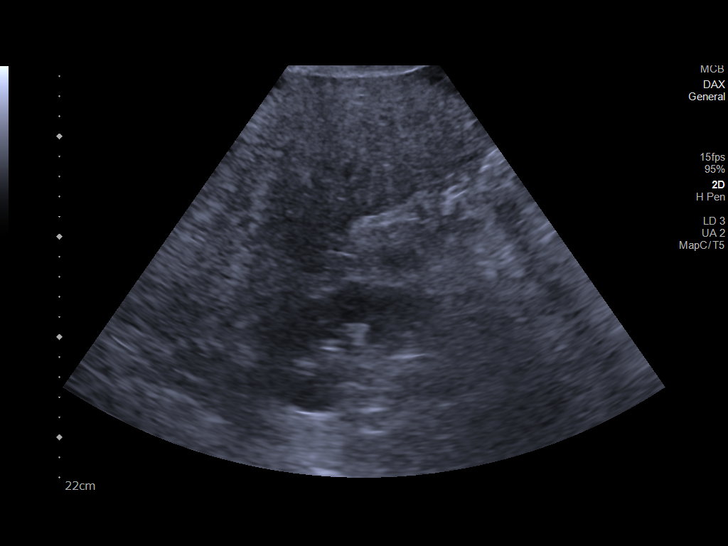
[im 9/36]
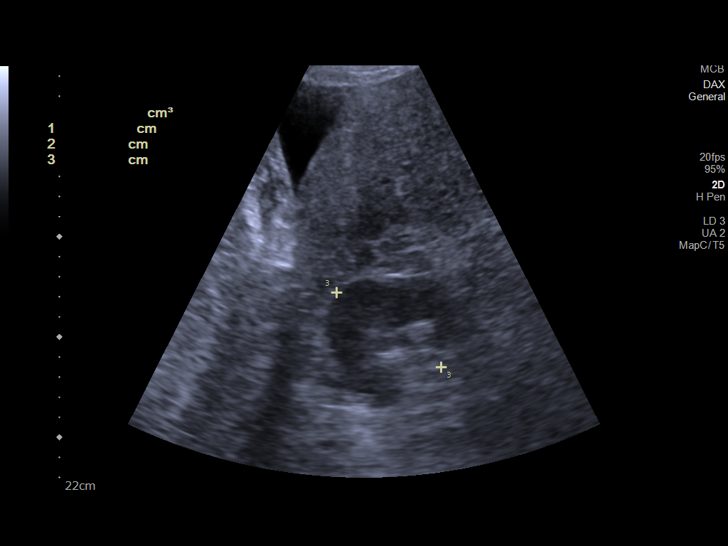
[im 12/36]
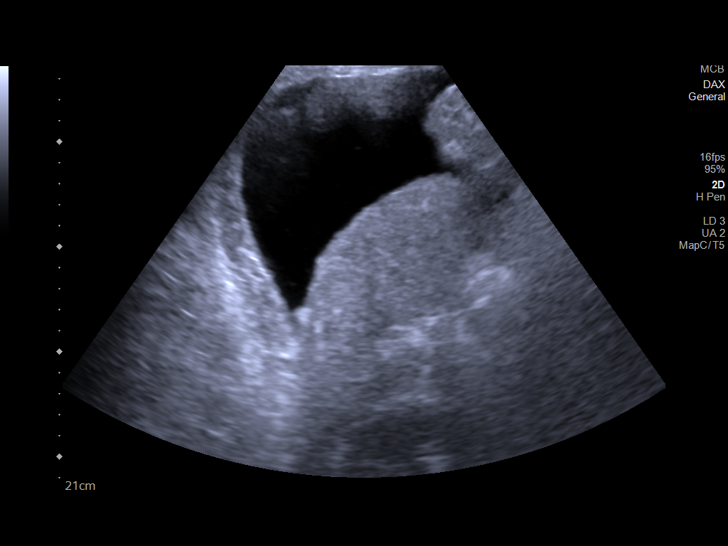
[im 14/36]
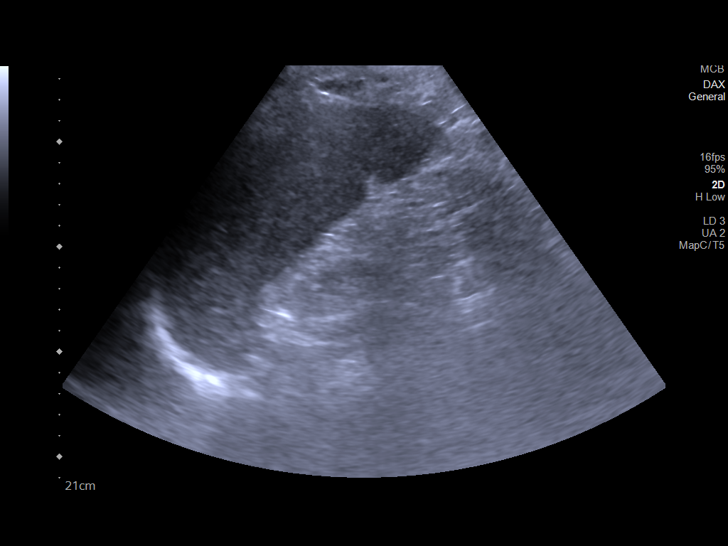
[im 17/36]
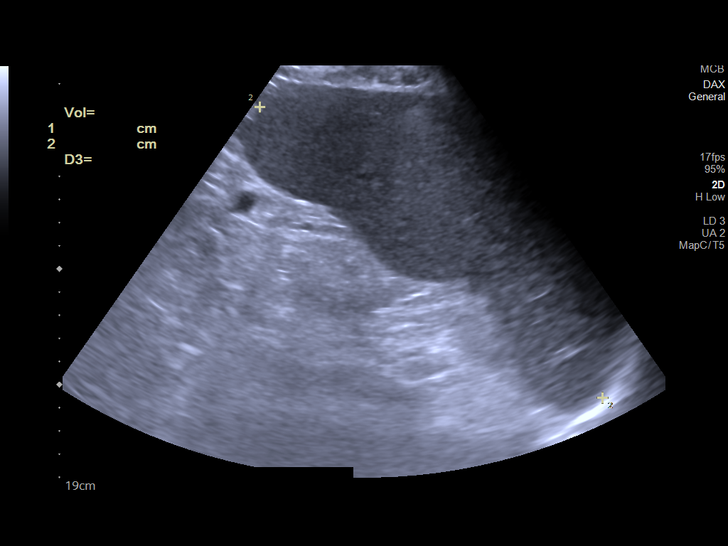
[im 19/36]
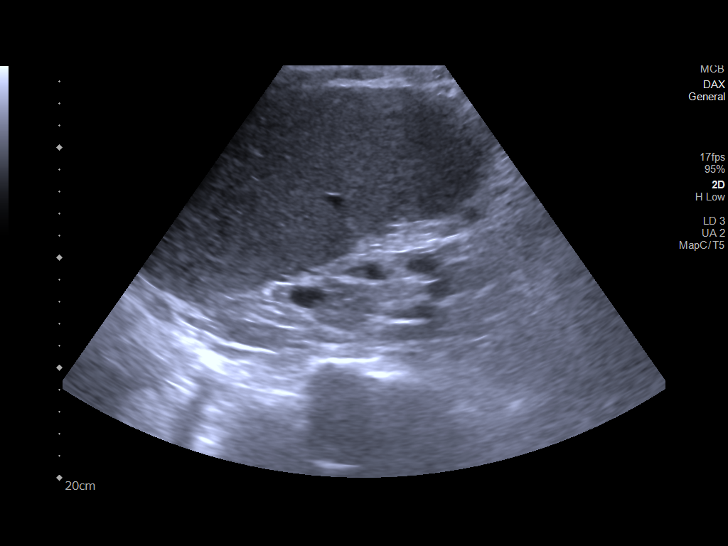
[im 22/36]
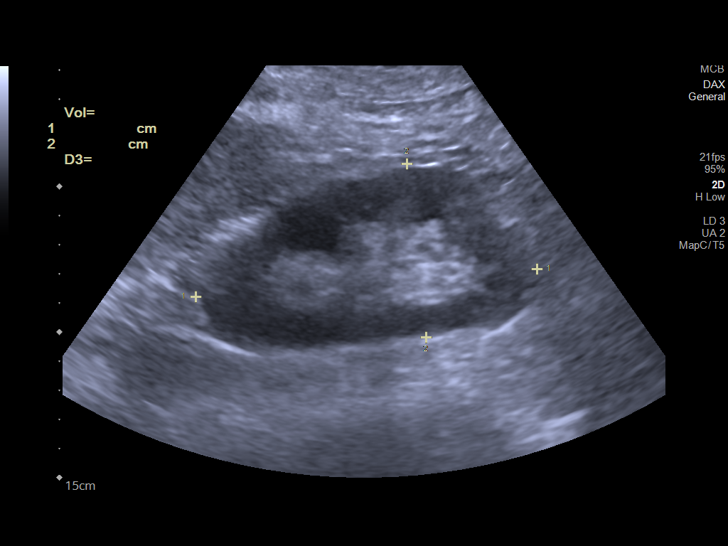
[im 24/36]
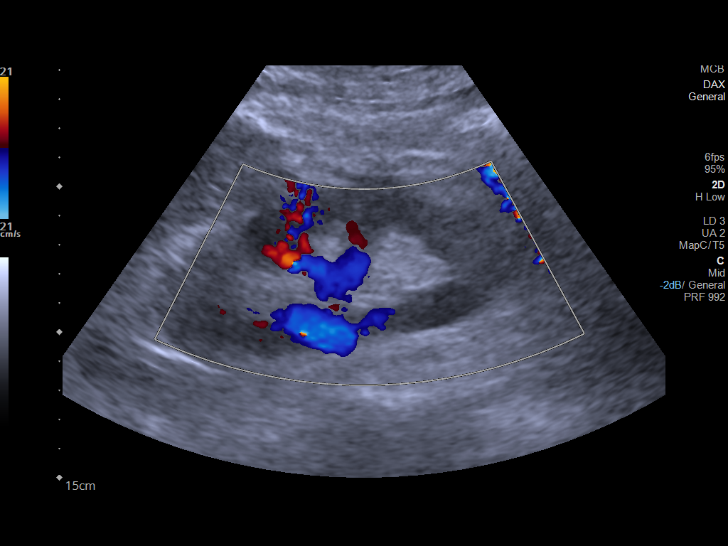
[im 27/36]
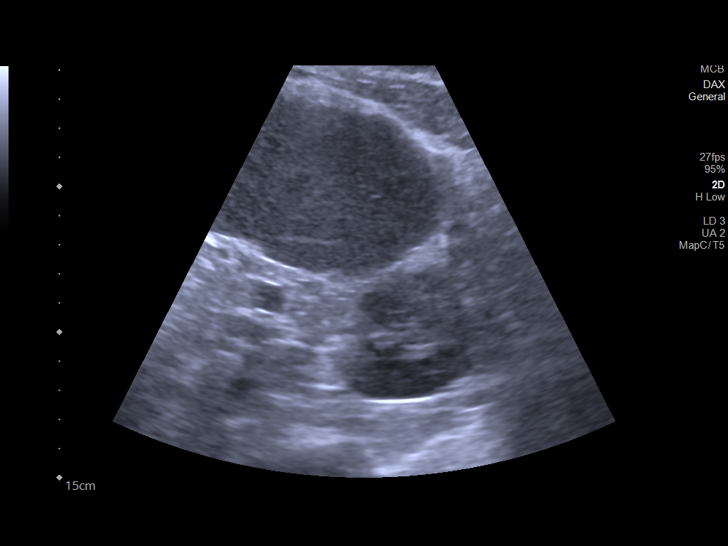
[im 30/36]
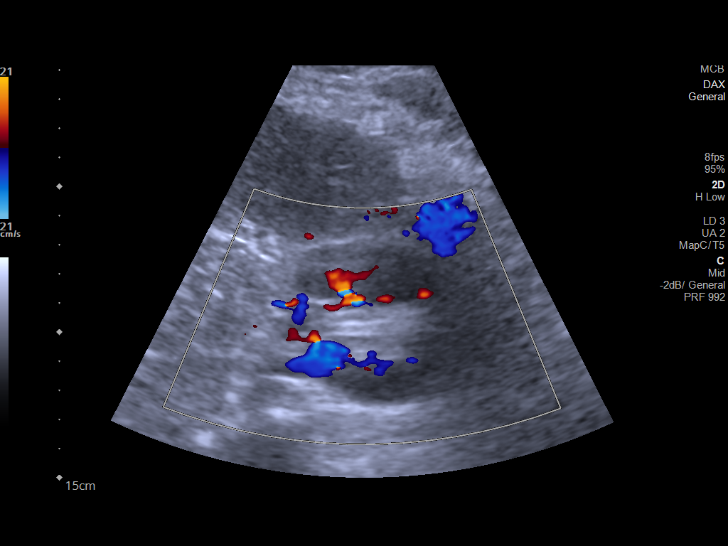
[im 33/36]
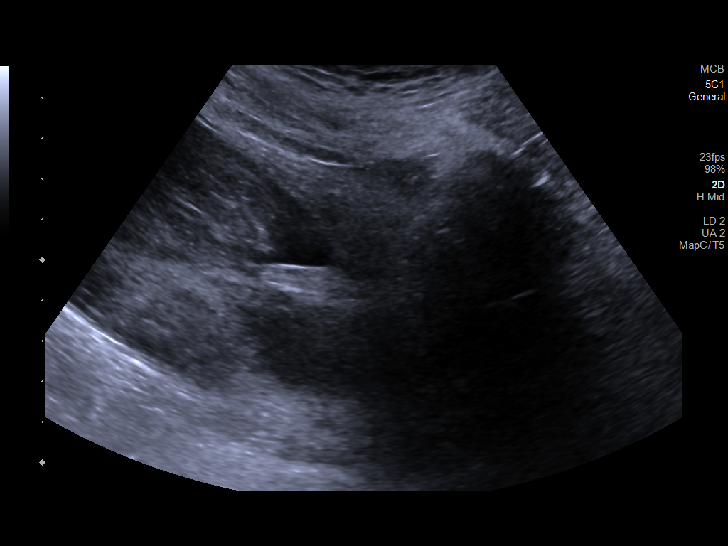
[im 36/36]
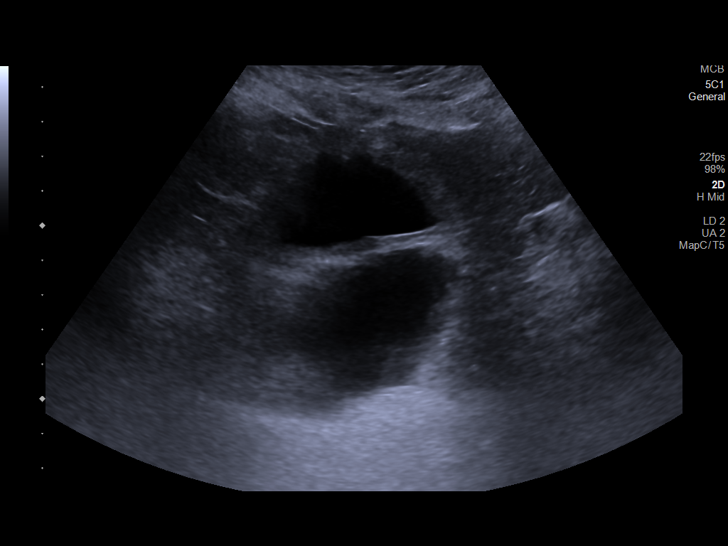

[14 of 25 positions shown; findings below may reference images not displayed]

FINDINGS: Right Kidney:

Renal measurements: 12.4 x 6.8 x 6.4 cm = volume: 286 mL .
Echogenicity within normal limits. No mass or hydronephrosis
visualized.

Left Kidney:

Renal measurements: 11.8 x 6 x 6.5 cm = volume: 239 mL. Echogenicity
within normal limits. No mass or hydronephrosis visualized.

Bladder:

Appears normal for degree of bladder distention.

Other: Increased hepatic echogenicity with a slightly nodular
contour as can be seen with hepatic cirrhosis. Small amount of
perihepatic ascites. Splenomegaly with the spleen measuring 18.5 cm
in length and a volume of 9000 mL.
IMPRESSION: 1. Normal renal ultrasound.
2. Increased hepatic echogenicity with a slightly nodular contour as
can be seen with hepatic cirrhosis. Small volume perihepatic
ascites. Splenomegaly. Correlate with liver function test.
